# Patient Record
Sex: Female | Born: 1940 | Race: White | Hispanic: No | Marital: Single | Smoking: Current every day smoker
Health system: Southern US, Community
[De-identification: ages and names within clinical notes are randomized; demographics above are authoritative.]

## PROBLEM LIST (undated history)

## (undated) DIAGNOSIS — S93401A Sprain of unspecified ligament of right ankle, initial encounter: Secondary | ICD-10-CM

## (undated) DIAGNOSIS — Z5189 Encounter for other specified aftercare: Secondary | ICD-10-CM

## (undated) DIAGNOSIS — F32A Depression, unspecified: Secondary | ICD-10-CM

## (undated) DIAGNOSIS — F419 Anxiety disorder, unspecified: Secondary | ICD-10-CM

## (undated) DIAGNOSIS — I639 Cerebral infarction, unspecified: Secondary | ICD-10-CM

## (undated) DIAGNOSIS — C801 Malignant (primary) neoplasm, unspecified: Secondary | ICD-10-CM

## (undated) DIAGNOSIS — Z87898 Personal history of other specified conditions: Secondary | ICD-10-CM

## (undated) DIAGNOSIS — E039 Hypothyroidism, unspecified: Secondary | ICD-10-CM

## (undated) DIAGNOSIS — I10 Essential (primary) hypertension: Secondary | ICD-10-CM

## (undated) DIAGNOSIS — G459 Transient cerebral ischemic attack, unspecified: Secondary | ICD-10-CM

## (undated) DIAGNOSIS — Z9010 Acquired absence of unspecified breast and nipple: Secondary | ICD-10-CM

## (undated) DIAGNOSIS — F4310 Post-traumatic stress disorder, unspecified: Secondary | ICD-10-CM

## (undated) DIAGNOSIS — F329 Major depressive disorder, single episode, unspecified: Secondary | ICD-10-CM

## (undated) HISTORY — DX: Encounter for other specified aftercare: Z51.89

## (undated) HISTORY — DX: Anxiety disorder, unspecified: F41.9

## (undated) HISTORY — DX: Acquired absence of unspecified breast and nipple: Z90.10

## (undated) HISTORY — DX: Transient cerebral ischemic attack, unspecified: G45.9

## (undated) HISTORY — PX: CHOLECYSTECTOMY: SHX55

## (undated) HISTORY — DX: Essential (primary) hypertension: I10

## (undated) HISTORY — PX: TUBAL LIGATION: SHX77

## (undated) HISTORY — PX: LUNG BIOPSY: SHX232

## (undated) HISTORY — DX: Major depressive disorder, single episode, unspecified: F32.9

## (undated) HISTORY — PX: OTHER SURGICAL HISTORY: SHX169

## (undated) HISTORY — DX: Sprain of unspecified ligament of right ankle, initial encounter: S93.401A

## (undated) HISTORY — DX: Depression, unspecified: F32.A

## (undated) HISTORY — DX: Hypothyroidism, unspecified: E03.9

---

## 2000-11-26 DIAGNOSIS — I639 Cerebral infarction, unspecified: Secondary | ICD-10-CM

## 2000-11-26 HISTORY — PX: OTHER SURGICAL HISTORY: SHX169

## 2000-11-26 HISTORY — DX: Cerebral infarction, unspecified: I63.9

## 2001-10-26 HISTORY — PX: CAROTID ENDARTERECTOMY: SUR193

## 2001-10-31 ENCOUNTER — Encounter (INDEPENDENT_AMBULATORY_CARE_PROVIDER_SITE_OTHER): Payer: Self-pay | Admitting: Specialist

## 2001-10-31 ENCOUNTER — Ambulatory Visit (HOSPITAL_COMMUNITY): Admission: RE | Admit: 2001-10-31 | Discharge: 2001-11-01 | Payer: Self-pay

## 2003-01-20 ENCOUNTER — Encounter: Admission: RE | Admit: 2003-01-20 | Discharge: 2003-01-20 | Payer: Self-pay | Admitting: Internal Medicine

## 2003-02-09 ENCOUNTER — Encounter: Admission: RE | Admit: 2003-02-09 | Discharge: 2003-02-09 | Payer: Self-pay | Admitting: Internal Medicine

## 2003-03-12 ENCOUNTER — Encounter: Admission: RE | Admit: 2003-03-12 | Discharge: 2003-03-12 | Payer: Self-pay | Admitting: Internal Medicine

## 2003-03-15 ENCOUNTER — Encounter: Admission: RE | Admit: 2003-03-15 | Discharge: 2003-03-15 | Payer: Self-pay | Admitting: Internal Medicine

## 2003-03-29 ENCOUNTER — Encounter: Admission: RE | Admit: 2003-03-29 | Discharge: 2003-03-29 | Payer: Self-pay | Admitting: Internal Medicine

## 2003-06-15 ENCOUNTER — Encounter: Admission: RE | Admit: 2003-06-15 | Discharge: 2003-06-15 | Payer: Self-pay | Admitting: Internal Medicine

## 2003-09-06 ENCOUNTER — Encounter: Payer: Self-pay | Admitting: Internal Medicine

## 2003-09-06 ENCOUNTER — Ambulatory Visit (HOSPITAL_COMMUNITY): Admission: RE | Admit: 2003-09-06 | Discharge: 2003-09-06 | Payer: Self-pay | Admitting: Internal Medicine

## 2003-09-22 ENCOUNTER — Ambulatory Visit (HOSPITAL_COMMUNITY): Admission: RE | Admit: 2003-09-22 | Discharge: 2003-09-22 | Payer: Self-pay | Admitting: Internal Medicine

## 2003-10-19 ENCOUNTER — Encounter: Admission: RE | Admit: 2003-10-19 | Discharge: 2003-10-19 | Payer: Self-pay | Admitting: Internal Medicine

## 2003-10-27 ENCOUNTER — Encounter: Admission: RE | Admit: 2003-10-27 | Discharge: 2003-10-27 | Payer: Self-pay | Admitting: Internal Medicine

## 2004-02-02 ENCOUNTER — Encounter: Admission: RE | Admit: 2004-02-02 | Discharge: 2004-02-02 | Payer: Self-pay | Admitting: Internal Medicine

## 2004-03-24 ENCOUNTER — Encounter: Admission: RE | Admit: 2004-03-24 | Discharge: 2004-03-24 | Payer: Self-pay | Admitting: Internal Medicine

## 2004-05-09 ENCOUNTER — Encounter: Admission: RE | Admit: 2004-05-09 | Discharge: 2004-05-09 | Payer: Self-pay | Admitting: Internal Medicine

## 2004-07-06 ENCOUNTER — Emergency Department (HOSPITAL_COMMUNITY): Admission: EM | Admit: 2004-07-06 | Discharge: 2004-07-06 | Payer: Self-pay | Admitting: Family Medicine

## 2004-08-29 ENCOUNTER — Ambulatory Visit: Payer: Self-pay | Admitting: Internal Medicine

## 2004-08-29 ENCOUNTER — Encounter (INDEPENDENT_AMBULATORY_CARE_PROVIDER_SITE_OTHER): Payer: Self-pay | Admitting: Internal Medicine

## 2004-11-28 ENCOUNTER — Ambulatory Visit: Payer: Self-pay | Admitting: Internal Medicine

## 2004-11-28 LAB — FECAL OCCULT BLOOD, GUAIAC: Fecal Occult Blood: NEGATIVE

## 2005-07-19 ENCOUNTER — Ambulatory Visit: Payer: Self-pay | Admitting: Internal Medicine

## 2005-10-10 ENCOUNTER — Ambulatory Visit: Payer: Self-pay | Admitting: Internal Medicine

## 2005-10-24 ENCOUNTER — Ambulatory Visit: Payer: Self-pay | Admitting: Internal Medicine

## 2005-12-21 ENCOUNTER — Ambulatory Visit: Payer: Self-pay | Admitting: Internal Medicine

## 2005-12-28 ENCOUNTER — Emergency Department (HOSPITAL_COMMUNITY): Admission: EM | Admit: 2005-12-28 | Discharge: 2005-12-28 | Payer: Self-pay | Admitting: Emergency Medicine

## 2005-12-31 ENCOUNTER — Ambulatory Visit: Payer: Self-pay | Admitting: Orthopedic Surgery

## 2006-02-11 ENCOUNTER — Ambulatory Visit: Payer: Self-pay | Admitting: Orthopedic Surgery

## 2006-03-25 ENCOUNTER — Ambulatory Visit: Payer: Self-pay | Admitting: Orthopedic Surgery

## 2006-06-26 ENCOUNTER — Ambulatory Visit: Payer: Self-pay | Admitting: Internal Medicine

## 2006-07-05 ENCOUNTER — Ambulatory Visit: Payer: Self-pay | Admitting: Internal Medicine

## 2006-08-07 ENCOUNTER — Ambulatory Visit: Payer: Self-pay | Admitting: Internal Medicine

## 2006-11-18 ENCOUNTER — Ambulatory Visit: Payer: Self-pay | Admitting: Internal Medicine

## 2006-11-18 ENCOUNTER — Encounter (INDEPENDENT_AMBULATORY_CARE_PROVIDER_SITE_OTHER): Payer: Self-pay | Admitting: Internal Medicine

## 2006-11-18 LAB — CONVERTED CEMR LAB
Free T4: 2.01 ng/dL — ABNORMAL HIGH (ref 0.89–1.80)
HCT: 45.9 % (ref 36.0–46.0)
Lymphocytes Relative: 40 % (ref 12–46)
Lymphs Abs: 3.8 10*3/uL — ABNORMAL HIGH (ref 0.7–3.3)
MCHC: 34 g/dL (ref 30.0–36.0)
Monocytes Absolute: 0.6 10*3/uL (ref 0.2–0.7)
Neutro Abs: 4.9 10*3/uL (ref 1.7–7.7)
RBC: 4.96 M/uL (ref 3.87–5.11)

## 2006-12-12 ENCOUNTER — Telehealth: Payer: Self-pay | Admitting: *Deleted

## 2006-12-13 ENCOUNTER — Encounter (INDEPENDENT_AMBULATORY_CARE_PROVIDER_SITE_OTHER): Payer: Self-pay | Admitting: Internal Medicine

## 2006-12-13 DIAGNOSIS — Z8679 Personal history of other diseases of the circulatory system: Secondary | ICD-10-CM | POA: Insufficient documentation

## 2006-12-13 DIAGNOSIS — H409 Unspecified glaucoma: Secondary | ICD-10-CM | POA: Insufficient documentation

## 2006-12-13 DIAGNOSIS — I10 Essential (primary) hypertension: Secondary | ICD-10-CM | POA: Insufficient documentation

## 2006-12-13 DIAGNOSIS — E039 Hypothyroidism, unspecified: Secondary | ICD-10-CM

## 2006-12-13 DIAGNOSIS — F329 Major depressive disorder, single episode, unspecified: Secondary | ICD-10-CM

## 2006-12-13 DIAGNOSIS — F411 Generalized anxiety disorder: Secondary | ICD-10-CM

## 2006-12-16 ENCOUNTER — Ambulatory Visit: Payer: Self-pay | Admitting: Internal Medicine

## 2006-12-24 ENCOUNTER — Encounter (INDEPENDENT_AMBULATORY_CARE_PROVIDER_SITE_OTHER): Payer: Self-pay | Admitting: Internal Medicine

## 2006-12-24 LAB — CONVERTED CEMR LAB
Cholesterol: 115 mg/dL (ref 0–200)
LDL Cholesterol: 61 mg/dL (ref 0–99)

## 2007-01-15 ENCOUNTER — Telehealth: Payer: Self-pay | Admitting: *Deleted

## 2007-03-03 ENCOUNTER — Encounter (INDEPENDENT_AMBULATORY_CARE_PROVIDER_SITE_OTHER): Payer: Self-pay | Admitting: Internal Medicine

## 2007-03-03 ENCOUNTER — Ambulatory Visit: Payer: Self-pay | Admitting: *Deleted

## 2007-03-03 DIAGNOSIS — J329 Chronic sinusitis, unspecified: Secondary | ICD-10-CM | POA: Insufficient documentation

## 2007-03-03 DIAGNOSIS — Z87448 Personal history of other diseases of urinary system: Secondary | ICD-10-CM

## 2007-03-03 DIAGNOSIS — F17200 Nicotine dependence, unspecified, uncomplicated: Secondary | ICD-10-CM

## 2007-03-06 ENCOUNTER — Encounter (INDEPENDENT_AMBULATORY_CARE_PROVIDER_SITE_OTHER): Payer: Self-pay | Admitting: Internal Medicine

## 2007-03-06 LAB — CONVERTED CEMR LAB
ALT: 28 units/L (ref 0–35)
AST: 29 units/L (ref 0–37)
Albumin: 4.7 g/dL (ref 3.5–5.2)
Alkaline Phosphatase: 96 units/L (ref 39–117)
BUN: 17 mg/dL (ref 6–23)
Basophils Absolute: 0.1 10*3/uL (ref 0.0–0.1)
Basophils Relative: 1 % (ref 0–1)
Calcium: 9.7 mg/dL (ref 8.4–10.5)
Eosinophils Relative: 2 % (ref 0–5)
HCT: 48.2 % — ABNORMAL HIGH (ref 36.0–46.0)
Hemoglobin: 16 g/dL — ABNORMAL HIGH (ref 12.0–15.0)
Lymphocytes Relative: 35 % (ref 12–46)
Lymphs Abs: 3 10*3/uL (ref 0.7–3.3)
MCHC: 33.2 g/dL (ref 30.0–36.0)
MCV: 94 fL (ref 78.0–100.0)
Monocytes Absolute: 0.5 10*3/uL (ref 0.2–0.7)
Monocytes Relative: 6 % (ref 3–11)
Platelets: 307 10*3/uL (ref 150–400)
RBC: 5.13 M/uL — ABNORMAL HIGH (ref 3.87–5.11)
Sodium: 139 meq/L (ref 135–145)
Total Bilirubin: 0.5 mg/dL (ref 0.3–1.2)
WBC: 8.6 10*3/uL (ref 4.0–10.5)

## 2007-03-27 ENCOUNTER — Telehealth (INDEPENDENT_AMBULATORY_CARE_PROVIDER_SITE_OTHER): Payer: Self-pay | Admitting: *Deleted

## 2007-04-29 ENCOUNTER — Telehealth (INDEPENDENT_AMBULATORY_CARE_PROVIDER_SITE_OTHER): Payer: Self-pay | Admitting: *Deleted

## 2007-05-01 ENCOUNTER — Encounter (INDEPENDENT_AMBULATORY_CARE_PROVIDER_SITE_OTHER): Payer: Self-pay | Admitting: Internal Medicine

## 2007-05-01 ENCOUNTER — Ambulatory Visit (HOSPITAL_COMMUNITY): Admission: RE | Admit: 2007-05-01 | Discharge: 2007-05-01 | Payer: Self-pay | Admitting: Internal Medicine

## 2007-05-02 ENCOUNTER — Encounter (INDEPENDENT_AMBULATORY_CARE_PROVIDER_SITE_OTHER): Payer: Self-pay | Admitting: *Deleted

## 2007-05-02 LAB — CONVERTED CEMR LAB: TSH: 0.087 microintl units/mL — ABNORMAL LOW (ref 0.350–5.50)

## 2007-07-03 ENCOUNTER — Telehealth (INDEPENDENT_AMBULATORY_CARE_PROVIDER_SITE_OTHER): Payer: Self-pay | Admitting: Pharmacy Technician

## 2007-07-10 ENCOUNTER — Ambulatory Visit: Payer: Self-pay | Admitting: Internal Medicine

## 2007-07-10 ENCOUNTER — Encounter (INDEPENDENT_AMBULATORY_CARE_PROVIDER_SITE_OTHER): Payer: Self-pay | Admitting: Internal Medicine

## 2007-07-11 LAB — CONVERTED CEMR LAB: TSH: 0.056 microintl units/mL — ABNORMAL LOW (ref 0.350–5.50)

## 2007-08-01 ENCOUNTER — Telehealth: Payer: Self-pay | Admitting: *Deleted

## 2007-08-01 ENCOUNTER — Encounter (INDEPENDENT_AMBULATORY_CARE_PROVIDER_SITE_OTHER): Payer: Self-pay | Admitting: Internal Medicine

## 2007-10-02 ENCOUNTER — Ambulatory Visit: Payer: Self-pay | Admitting: Internal Medicine

## 2007-10-02 ENCOUNTER — Encounter (INDEPENDENT_AMBULATORY_CARE_PROVIDER_SITE_OTHER): Payer: Self-pay | Admitting: Internal Medicine

## 2007-10-02 DIAGNOSIS — N951 Menopausal and female climacteric states: Secondary | ICD-10-CM

## 2007-10-03 ENCOUNTER — Telehealth: Payer: Self-pay | Admitting: *Deleted

## 2007-10-03 LAB — CONVERTED CEMR LAB: TSH: 0.519 microintl units/mL (ref 0.350–5.50)

## 2008-01-30 ENCOUNTER — Encounter: Payer: Self-pay | Admitting: *Deleted

## 2008-03-04 ENCOUNTER — Encounter (INDEPENDENT_AMBULATORY_CARE_PROVIDER_SITE_OTHER): Payer: Self-pay | Admitting: *Deleted

## 2008-03-04 ENCOUNTER — Ambulatory Visit: Payer: Self-pay | Admitting: Infectious Disease

## 2008-03-08 LAB — CONVERTED CEMR LAB
ALT: 43 units/L — ABNORMAL HIGH (ref 0–35)
AST: 39 units/L — ABNORMAL HIGH (ref 0–37)
Albumin: 4.4 g/dL (ref 3.5–5.2)
Cholesterol: 124 mg/dL (ref 0–200)
HCT: 44.5 % (ref 36.0–46.0)
HDL: 45 mg/dL (ref >39)
LDL Cholesterol: 68 mg/dL (ref 0–99)
Sodium: 141 meq/L (ref 135–145)
TSH: 0.806 microintl units/mL (ref 0.350–5.50)
Total Bilirubin: 0.5 mg/dL (ref 0.3–1.2)
Triglycerides: 56 mg/dL (ref <150)
VLDL: 11 mg/dL (ref 0–40)
WBC: 8.4 10*3/uL (ref 4.0–10.5)

## 2008-07-20 ENCOUNTER — Telehealth (INDEPENDENT_AMBULATORY_CARE_PROVIDER_SITE_OTHER): Payer: Self-pay | Admitting: Internal Medicine

## 2008-07-22 ENCOUNTER — Encounter (INDEPENDENT_AMBULATORY_CARE_PROVIDER_SITE_OTHER): Payer: Self-pay | Admitting: Internal Medicine

## 2008-07-22 ENCOUNTER — Ambulatory Visit: Payer: Self-pay | Admitting: Internal Medicine

## 2008-07-22 DIAGNOSIS — K047 Periapical abscess without sinus: Secondary | ICD-10-CM

## 2008-07-22 DIAGNOSIS — R740 Nonspecific elevation of levels of transaminase and lactic acid dehydrogenase [LDH]: Secondary | ICD-10-CM

## 2008-07-23 DIAGNOSIS — E559 Vitamin D deficiency, unspecified: Secondary | ICD-10-CM | POA: Insufficient documentation

## 2008-07-23 LAB — CONVERTED CEMR LAB
Albumin: 4.4 g/dL (ref 3.5–5.2)
BUN: 13 mg/dL (ref 6–23)
Chloride: 100 meq/L (ref 96–112)
Total Bilirubin: 0.8 mg/dL (ref 0.3–1.2)
Total Protein: 7.4 g/dL (ref 6.0–8.3)
Vit D, 1,25-Dihydroxy: 20 — ABNORMAL LOW (ref 30–89)

## 2008-07-26 ENCOUNTER — Ambulatory Visit (HOSPITAL_COMMUNITY): Admission: RE | Admit: 2008-07-26 | Discharge: 2008-07-26 | Payer: Self-pay | Admitting: Internal Medicine

## 2008-07-26 ENCOUNTER — Encounter (INDEPENDENT_AMBULATORY_CARE_PROVIDER_SITE_OTHER): Payer: Self-pay | Admitting: Internal Medicine

## 2008-07-27 ENCOUNTER — Ambulatory Visit (HOSPITAL_COMMUNITY): Admission: RE | Admit: 2008-07-27 | Discharge: 2008-07-27 | Payer: Self-pay | Admitting: Internal Medicine

## 2008-07-29 ENCOUNTER — Telehealth (INDEPENDENT_AMBULATORY_CARE_PROVIDER_SITE_OTHER): Payer: Self-pay | Admitting: Internal Medicine

## 2008-07-30 ENCOUNTER — Telehealth: Payer: Self-pay | Admitting: Internal Medicine

## 2008-08-05 ENCOUNTER — Telehealth: Payer: Self-pay | Admitting: *Deleted

## 2008-08-11 ENCOUNTER — Ambulatory Visit: Payer: Self-pay | Admitting: Internal Medicine

## 2008-08-11 LAB — CONVERTED CEMR LAB
OCCULT 1: NEGATIVE
OCCULT 2: NEGATIVE
OCCULT 3: NEGATIVE

## 2008-08-24 ENCOUNTER — Ambulatory Visit: Payer: Self-pay | Admitting: Internal Medicine

## 2008-08-24 ENCOUNTER — Encounter (INDEPENDENT_AMBULATORY_CARE_PROVIDER_SITE_OTHER): Payer: Self-pay | Admitting: Internal Medicine

## 2008-08-24 DIAGNOSIS — K8020 Calculus of gallbladder without cholecystitis without obstruction: Secondary | ICD-10-CM | POA: Insufficient documentation

## 2008-08-26 LAB — CONVERTED CEMR LAB
CO2: 20 meq/L (ref 19–32)
Calcium: 9.4 mg/dL (ref 8.4–10.5)
Chloride: 100 meq/L (ref 96–112)
Creatinine, Ser: 0.85 mg/dL (ref 0.40–1.20)
Glucose, Bld: 89 mg/dL (ref 70–99)
Sodium: 140 meq/L (ref 135–145)

## 2008-11-10 ENCOUNTER — Telehealth (INDEPENDENT_AMBULATORY_CARE_PROVIDER_SITE_OTHER): Payer: Self-pay | Admitting: Internal Medicine

## 2008-11-15 ENCOUNTER — Telehealth (INDEPENDENT_AMBULATORY_CARE_PROVIDER_SITE_OTHER): Payer: Self-pay | Admitting: Internal Medicine

## 2009-01-31 ENCOUNTER — Ambulatory Visit: Payer: Self-pay | Admitting: *Deleted

## 2009-01-31 ENCOUNTER — Encounter (INDEPENDENT_AMBULATORY_CARE_PROVIDER_SITE_OTHER): Payer: Self-pay | Admitting: Internal Medicine

## 2009-02-02 LAB — CONVERTED CEMR LAB
AST: 78 units/L — ABNORMAL HIGH (ref 0–37)
Alkaline Phosphatase: 72 units/L (ref 39–117)
BUN: 12 mg/dL (ref 6–23)
CO2: 26 meq/L (ref 19–32)
Chloride: 105 meq/L (ref 96–112)
Glucose, Bld: 96 mg/dL (ref 70–99)
Total Bilirubin: 0.5 mg/dL (ref 0.3–1.2)
Vit D, 25-Hydroxy: 73 ng/mL (ref 30–89)

## 2009-02-17 ENCOUNTER — Telehealth (INDEPENDENT_AMBULATORY_CARE_PROVIDER_SITE_OTHER): Payer: Self-pay | Admitting: Internal Medicine

## 2009-02-23 ENCOUNTER — Encounter (INDEPENDENT_AMBULATORY_CARE_PROVIDER_SITE_OTHER): Payer: Self-pay | Admitting: Internal Medicine

## 2009-02-25 LAB — CONVERTED CEMR LAB: Hep B Core Total Ab: NEGATIVE

## 2009-02-28 ENCOUNTER — Telehealth: Payer: Self-pay | Admitting: *Deleted

## 2009-03-02 ENCOUNTER — Ambulatory Visit (HOSPITAL_COMMUNITY): Admission: RE | Admit: 2009-03-02 | Discharge: 2009-03-02 | Payer: Self-pay | Admitting: Internal Medicine

## 2009-03-11 ENCOUNTER — Encounter (INDEPENDENT_AMBULATORY_CARE_PROVIDER_SITE_OTHER): Payer: Self-pay | Admitting: Internal Medicine

## 2009-03-11 ENCOUNTER — Ambulatory Visit: Payer: Self-pay | Admitting: Internal Medicine

## 2009-03-11 DIAGNOSIS — R5381 Other malaise: Secondary | ICD-10-CM | POA: Insufficient documentation

## 2009-03-11 DIAGNOSIS — R5383 Other fatigue: Secondary | ICD-10-CM

## 2009-03-11 LAB — CONVERTED CEMR LAB
Albumin: 4.5 g/dL (ref 3.5–5.2)
Alkaline Phosphatase: 84 units/L (ref 39–117)
Bilirubin Urine: NEGATIVE
Bilirubin Urine: NEGATIVE
CO2: 20 meq/L (ref 19–32)
Calcium: 10 mg/dL (ref 8.4–10.5)
Ferritin: 204 ng/mL (ref 10–291)
Glucose, Urine, Semiquant: NEGATIVE
Hemoglobin, Urine: NEGATIVE
Ketones, ur: NEGATIVE mg/dL
Ketones, urine, test strip: NEGATIVE
Leukocytes, UA: NEGATIVE
Platelets: 235 10*3/uL (ref 150–400)
Potassium: 4.1 meq/L (ref 3.5–5.3)
Protein, ur: NEGATIVE mg/dL
Sed Rate: 9 mm/hr (ref 0–22)
Specific Gravity, Urine: <1.005
TSH: 2.261 microintl units/mL (ref 0.350–4.500)
Total Bilirubin: 0.4 mg/dL (ref 0.3–1.2)
UIBC: 320 ug/dL
Urine Glucose: NEGATIVE mg/dL
Urobilinogen, UA: 1 (ref 0.0–1.0)
pH: 5

## 2009-03-25 ENCOUNTER — Ambulatory Visit: Payer: Self-pay | Admitting: Internal Medicine

## 2009-03-25 ENCOUNTER — Encounter (INDEPENDENT_AMBULATORY_CARE_PROVIDER_SITE_OTHER): Payer: Self-pay | Admitting: Internal Medicine

## 2009-03-25 DIAGNOSIS — J029 Acute pharyngitis, unspecified: Secondary | ICD-10-CM | POA: Insufficient documentation

## 2009-03-25 LAB — CONVERTED CEMR LAB: Streptococcus, Group A Screen (Direct): NEGATIVE

## 2009-06-16 ENCOUNTER — Ambulatory Visit: Payer: Self-pay | Admitting: Internal Medicine

## 2009-06-16 ENCOUNTER — Encounter (INDEPENDENT_AMBULATORY_CARE_PROVIDER_SITE_OTHER): Payer: Self-pay | Admitting: Internal Medicine

## 2009-06-16 DIAGNOSIS — L989 Disorder of the skin and subcutaneous tissue, unspecified: Secondary | ICD-10-CM | POA: Insufficient documentation

## 2009-06-16 LAB — CONVERTED CEMR LAB
Albumin: 4.2 g/dL (ref 3.5–5.2)
Alkaline Phosphatase: 84 units/L (ref 39–117)
Basophils Relative: 1 % (ref 0–1)
CO2: 19 meq/L (ref 19–32)
Calcium: 9.5 mg/dL (ref 8.4–10.5)
Chloride: 103 meq/L (ref 96–112)
Hemoglobin: 15.5 g/dL — ABNORMAL HIGH (ref 12.0–15.0)
Lymphs Abs: 3.2 10*3/uL (ref 0.7–4.0)
MCV: 95.2 fL (ref 78.0–100.0)
Monocytes Relative: 7 % (ref 3–12)
Neutrophils Relative %: 48 % (ref 43–77)
Potassium: 4 meq/L (ref 3.5–5.3)
RDW: 13.8 % (ref 11.5–15.5)
TSH: 1.251 microintl units/mL (ref 0.350–4.500)
Vit D, 25-Hydroxy: 52 ng/mL (ref 30–89)
WBC: 7.5 10*3/uL (ref 4.0–10.5)

## 2009-07-05 ENCOUNTER — Ambulatory Visit: Payer: Self-pay | Admitting: Internal Medicine

## 2009-08-09 ENCOUNTER — Ambulatory Visit: Payer: Self-pay | Admitting: Internal Medicine

## 2009-08-10 ENCOUNTER — Telehealth (INDEPENDENT_AMBULATORY_CARE_PROVIDER_SITE_OTHER): Payer: Self-pay | Admitting: Internal Medicine

## 2009-08-12 ENCOUNTER — Ambulatory Visit (HOSPITAL_COMMUNITY): Admission: RE | Admit: 2009-08-12 | Discharge: 2009-08-12 | Payer: Self-pay | Admitting: Internal Medicine

## 2009-09-04 ENCOUNTER — Emergency Department (HOSPITAL_COMMUNITY): Admission: EM | Admit: 2009-09-04 | Discharge: 2009-09-05 | Payer: Self-pay | Admitting: Emergency Medicine

## 2009-09-12 ENCOUNTER — Encounter (INDEPENDENT_AMBULATORY_CARE_PROVIDER_SITE_OTHER): Payer: Self-pay | Admitting: Internal Medicine

## 2009-09-12 ENCOUNTER — Ambulatory Visit: Payer: Self-pay | Admitting: Internal Medicine

## 2009-09-12 DIAGNOSIS — S93409A Sprain of unspecified ligament of unspecified ankle, initial encounter: Secondary | ICD-10-CM | POA: Insufficient documentation

## 2009-09-19 ENCOUNTER — Ambulatory Visit (HOSPITAL_COMMUNITY): Admission: RE | Admit: 2009-09-19 | Discharge: 2009-09-19 | Payer: Self-pay | Admitting: Internal Medicine

## 2009-11-17 ENCOUNTER — Telehealth (INDEPENDENT_AMBULATORY_CARE_PROVIDER_SITE_OTHER): Payer: Self-pay | Admitting: Internal Medicine

## 2009-11-17 ENCOUNTER — Encounter (INDEPENDENT_AMBULATORY_CARE_PROVIDER_SITE_OTHER): Payer: Self-pay | Admitting: Internal Medicine

## 2009-11-26 HISTORY — PX: EYE SURGERY: SHX253

## 2009-12-19 ENCOUNTER — Ambulatory Visit: Payer: Self-pay | Admitting: Internal Medicine

## 2009-12-22 LAB — CONVERTED CEMR LAB
AST: 38 units/L — ABNORMAL HIGH (ref 0–37)
Albumin: 4.4 g/dL (ref 3.5–5.2)
BUN: 15 mg/dL (ref 6–23)
CO2: 23 meq/L (ref 19–32)
Calcium: 9.5 mg/dL (ref 8.4–10.5)
Cholesterol: 131 mg/dL (ref 0–200)
Creatinine, Ser: 0.85 mg/dL (ref 0.40–1.20)
Eosinophils Absolute: 0.1 10*3/uL (ref 0.0–0.7)
Eosinophils Relative: 1 % (ref 0–5)
HDL: 52 mg/dL (ref >39)
LDL Cholesterol: 70 mg/dL (ref 0–99)
Monocytes Relative: 5 % (ref 3–12)
RBC: 4.9 M/uL (ref 3.87–5.11)
Sodium: 139 meq/L (ref 135–145)
TSH: 6.8 microintl units/mL — ABNORMAL HIGH (ref 0.350–4.5)
Total Protein: 7 g/dL (ref 6.0–8.3)
VLDL: 9 mg/dL (ref 0–40)
WBC: 7.3 10*3/uL (ref 4.0–10.5)

## 2010-01-06 ENCOUNTER — Ambulatory Visit: Payer: Self-pay | Admitting: Internal Medicine

## 2010-01-26 ENCOUNTER — Emergency Department (HOSPITAL_COMMUNITY): Admission: EM | Admit: 2010-01-26 | Discharge: 2010-01-26 | Payer: Self-pay | Admitting: Emergency Medicine

## 2010-02-13 ENCOUNTER — Ambulatory Visit: Payer: Self-pay | Admitting: Internal Medicine

## 2010-02-13 LAB — CONVERTED CEMR LAB
ALT: 36 units/L — ABNORMAL HIGH (ref 0–35)
Albumin: 4.6 g/dL (ref 3.5–5.2)
CO2: 25 meq/L (ref 19–32)
Calcium: 10.1 mg/dL (ref 8.4–10.5)
Chloride: 101 meq/L (ref 96–112)
Creatinine, Ser: 0.91 mg/dL (ref 0.40–1.20)
Free T4: 1.69 ng/dL (ref 0.80–1.80)
Glucose, Bld: 85 mg/dL (ref 70–99)
HCT: 44.8 % (ref 36.0–46.0)
RBC: 4.79 M/uL (ref 3.87–5.11)
Total Protein: 7.4 g/dL (ref 6.0–8.3)

## 2010-03-08 ENCOUNTER — Telehealth (INDEPENDENT_AMBULATORY_CARE_PROVIDER_SITE_OTHER): Payer: Self-pay | Admitting: Internal Medicine

## 2010-03-20 ENCOUNTER — Telehealth (INDEPENDENT_AMBULATORY_CARE_PROVIDER_SITE_OTHER): Payer: Self-pay | Admitting: Internal Medicine

## 2010-03-26 HISTORY — PX: LAPAROSCOPIC CHOLECYSTECTOMY W/ CHOLANGIOGRAPHY: SUR757

## 2010-04-12 ENCOUNTER — Ambulatory Visit (HOSPITAL_COMMUNITY): Admission: RE | Admit: 2010-04-12 | Discharge: 2010-04-12 | Payer: Self-pay | Admitting: General Surgery

## 2010-05-01 ENCOUNTER — Encounter: Payer: Self-pay | Admitting: Internal Medicine

## 2010-06-13 ENCOUNTER — Ambulatory Visit (HOSPITAL_COMMUNITY): Admission: RE | Admit: 2010-06-13 | Discharge: 2010-06-13 | Payer: Self-pay | Admitting: Internal Medicine

## 2010-06-13 ENCOUNTER — Ambulatory Visit: Payer: Self-pay | Admitting: Internal Medicine

## 2010-06-14 ENCOUNTER — Encounter: Payer: Self-pay | Admitting: Internal Medicine

## 2010-06-14 LAB — CONVERTED CEMR LAB
ALT: 27 units/L (ref 0–35)
Albumin: 4.5 g/dL (ref 3.5–5.2)
Alkaline Phosphatase: 73 units/L (ref 39–117)
Bilirubin, Direct: 0.2 mg/dL (ref 0.0–0.3)
Total Bilirubin: 0.6 mg/dL (ref 0.3–1.2)
Total Protein: 7 g/dL (ref 6.0–8.3)

## 2010-06-27 ENCOUNTER — Telehealth: Payer: Self-pay | Admitting: Internal Medicine

## 2010-06-28 ENCOUNTER — Ambulatory Visit: Payer: Self-pay | Admitting: Internal Medicine

## 2010-06-28 LAB — CONVERTED CEMR LAB
Bilirubin Urine: NEGATIVE
Blood in Urine, dipstick: NEGATIVE
Urobilinogen, UA: 0.2
WBC Urine, dipstick: NEGATIVE

## 2010-07-04 ENCOUNTER — Telehealth: Payer: Self-pay | Admitting: Licensed Clinical Social Worker

## 2010-10-03 ENCOUNTER — Ambulatory Visit: Payer: Self-pay | Admitting: Internal Medicine

## 2010-10-03 ENCOUNTER — Encounter: Payer: Self-pay | Admitting: Internal Medicine

## 2010-10-03 ENCOUNTER — Ambulatory Visit (HOSPITAL_COMMUNITY): Admission: RE | Admit: 2010-10-03 | Discharge: 2010-10-03 | Payer: Self-pay | Admitting: Internal Medicine

## 2010-10-04 ENCOUNTER — Telehealth: Payer: Self-pay | Admitting: Internal Medicine

## 2010-10-09 ENCOUNTER — Telehealth: Payer: Self-pay | Admitting: Internal Medicine

## 2010-10-26 ENCOUNTER — Ambulatory Visit: Payer: Self-pay | Admitting: Internal Medicine

## 2010-12-17 ENCOUNTER — Encounter: Payer: Self-pay | Admitting: Internal Medicine

## 2010-12-18 ENCOUNTER — Encounter: Payer: Self-pay | Admitting: Internal Medicine

## 2010-12-24 LAB — CONVERTED CEMR LAB
Calcium: 9.3 mg/dL (ref 8.4–10.5)
Chloride: 105 meq/L (ref 96–112)
Potassium: 4.1 meq/L (ref 3.5–5.3)
Sodium: 142 meq/L (ref 135–145)

## 2010-12-26 NOTE — Progress Notes (Signed)
Summary: Refill/gh  Phone Note Refill Request Message from:  Fax from Pharmacy on March 08, 2010 8:59 AM  Refills Requested: Medication #1:  SYNTHROID 150 MCG TABS Take one tablet daily for thyroid.   Last Refilled: 01/30/2010  Medication #2:  HYDROCHLOROTHIAZIDE 12.5 MG TABS Take one tablet daily for blood pressure.   Last Refilled: 01/30/2010  Method Requested: Electronic Initial call taken by: Angelina Ok RN,  March 08, 2010 8:59 AM    Prescriptions: HYDROCHLOROTHIAZIDE 12.5 MG TABS (HYDROCHLOROTHIAZIDE) Take one tablet daily for blood pressure.  #32 x 6   Entered and Authorized by:   Elby Showers MD   Signed by:   Elby Showers MD on 03/09/2010   Method used:   Electronically to        [facility]* (retail)       [address]       [place], [place]  [address]       Ph: [phone]       Fax: [phone]   RxID:   [id number] SYNTHROID 150 MCG TABS (LEVOTHYROXINE SODIUM) Take one tablet daily for thyroid.  #32 x 3   Entered and Authorized by:   Elby Showers MD   Signed by:   Elby Showers MD on 03/09/2010   Method used:   Electronically to        [facility]* (retail)       [address]       [place], [place]  [address]       Ph: [phone]       Fax: [phone]   RxID:   [id number]

## 2010-12-26 NOTE — Assessment & Plan Note (Signed)
Summary: F/U/EST/VS   Vital Signs:  Patient profile:   70 year old female Height:      69 inches (175.26 cm) Weight:      166.8 pounds (75.82 kg) BMI:     24.72 Temp:     97.2 degrees F (36.22 degrees C) oral Pulse rate:   67 / minute BP sitting:   134 / 74  (right arm)  Vitals Entered By: Stanton Kidney Ditzler RN (February 13, 2010 11:40 AM) Is Patient Diabetic? No Pain Assessment Patient in pain? yes     Location: tooth ache Intensity: 1 Type: aching Onset of pain  past 2 weeks Nutritional Status BMI of 19 -24 = normal Nutritional Status Detail appetite down  Have you ever been in a relationship where you felt threatened, hurt or afraid?denies   Does patient need assistance? Functional Status Self care Ambulation Normal Comments FU - went to ER due to chest pain - needs GB surgery wants surgery to be done at [facility]. Sore throat cont.   Primary Care Provider:  Elby Showers MD   History of Present Illness: 23yof with pmh outlined in this chart is here to discuss  1) she went to [facility] with chest pain and was told that she needs a cholycystectomy.  Wants to go to someone in [place].    2) still with a sore throat.  Lives with multiple young people who keep contracting viruses and some who have had recurrent strep throat.  She is able to swallow. Has no other URI symptoms.  No fevers, chills, cough or chest pain.  3) had a bad gi bug with n/v/d.  Has lost 6 lbs this month.  Still hesitant to eat anything.  Feeling weak and dizzy when she stands up.  Drinks only 2 cups of coffee in the morning and then maybe a diet coke in the day time.  4) has been taking milk thistle for her liver.  Depression History:      The patient denies a depressed mood most of the day and a diminished interest in her usual daily activities.         Preventive Screening-Counseling & Management  Alcohol-Tobacco     Alcohol drinks/day: 0     Smoking Status: current     Smoking  Cessation Counseling: yes     Packs/Day: 3/4 ppd     Year Started: 1960  Caffeine-Diet-Exercise     Caffeine use/day: 2 cups of coffee and soda     Does Patient Exercise: no     Type of exercise: walking stairs at home     Times/week: 7  Medications Prior to Update: 1)  Alprazolam 0.5 Mg Tabs (Alprazolam) .Marland Kitchen.. 1 1/2 Tablets Three Times Daily 2)  Lisinopril 40 Mg Tabs (Lisinopril) .... Take 1 Tablet By Mouth Once A Day 3)  Synthroid 150 Mcg Tabs (Levothyroxine Sodium) .... Take One Tablet Daily For Thyroid. 4)  Cardizem La 180 Mg Xr24h-Tab (Diltiazem Hcl Coated Beads) .... Take 1 Tablet By Mouth Once A Day 5)  Aspirin 81 Mg Tbec (Aspirin) .... Take 1 Tablet By Mouth Once A Day 6)  Fosamax 35 Mg Tabs (Alendronate Sodium) .... Take One Tablet By Mouth Once A Week, With [employer]. Do Not Lay Down For 30 Minutes After Swallowing The Tablet. 7)  Lac-Hydrin Five 5 % Lotn (Ammonium Lactate) .... Apply Two Times A Day. 8)  Hydrochlorothiazide 12.5 Mg Tabs (Hydrochlorothiazide) .... Take One Tablet Daily For Blood Pressure.  9)  Vitamin D (Ergocalciferol) 50000 Unit Caps (Ergocalciferol) .... Take One Tablet Weekly For 8 Weeks. 10)  Amoxicillin 875 Mg Tabs (Amoxicillin) .... Take One Tablet Two Times A Day For 10 Days.  Allergies (verified): No Known Drug Allergies  Review of Systems       per hpi  Physical Exam  General:  alert and well-developed.   Head:  normocephalic and atraumatic.   Eyes:  vision grossly intact, pupils equal, pupils round, and pupils reactive to light.   Nose:  no external deformity.   Mouth:  good dentition, pharynx pink and moist, no erythema, no exudates, and no posterior lymphoid hypertrophy.   Lungs:  normal respiratory effort and normal breath sounds.   Heart:  normal rate, regular rhythm, and no murmur.   Abdomen:  soft, non-tender, normal bowel sounds, no distention, no masses, and no guarding.   Pulses:  2+ Extremities:  no edema Neurologic:  alert  & oriented X3, cranial nerves II-XII intact, strength normal in all extremities, and gait normal.   Skin:  no suspicious lesions.   Cervical Nodes:  no anterior cervical adenopathy and no posterior cervical adenopathy.   Psych:  Oriented X3, memory intact for recent and remote, normally interactive, and good eye contact.     Impression & Recommendations:  Problem # 1:  TRANSAMINASES, SERUM, ELEVATED (ICD-790.4) She has been thoroughly evaluated for this.  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  With her recent episode of pain and concurrent mild increase in LFT's it is possible that she has intermittant CBD obstruction causing symtpoms.  She would like to have her gb out to avoid recurrence of symptoms.  She would like to go to [facility] for this.  We do not refer to anyone in [place] so I have asked her to look around for someone, then let us know so we can send records.  This is not urgent.  Problem # 2:  CHOLELITHIASIS, ASYMPTOMATIC (ICD-574.20) See #1  Problem # 3:  PHARYNGITIS (ICD-462) I have already given one full course of amoxicillin.  There is no real erythema or exudate today, no fever or LAD.  Will not treat again at this time.  She should try symptomatic relief and avoid contact with her grandchildren when they are sick.  The following medications were removed from the medication list:    Amoxicillin 875 Mg Tabs (Amoxicillin) .Marland Kitchen... Take one tablet two times a day for 10 days. Her updated medication list for this problem includes:    Aspirin 81 Mg Tbec (Aspirin) .Marland Kitchen... Take 1 tablet by mouth once a day  Problem # 4:  HYPOTHYROIDISM (ICD-244.9) Time to check TSH after change in dose of synthroid.  Her updated medication list for this problem includes:    Synthroid 150 Mcg Tabs (Levothyroxine sodium) .Marland Kitchen... Take one tablet daily for thyroid.  Orders: T-TSH [phone]) T-T4, Free ([phone])  Problem # 5:   FATIGUE (ICD-780.79) She feels weak and fatigued.  She has had a gi bug with copious diarrhea and decreased appetite.  Will check basic labs, but I think she is still volume depleted and worn out from being sick.  Advised her to increase fluid intake and try to eat some protien.  She will call if she does not feel better in a wekk.  Orders: T-CBC No Diff ([id number]) T-Comprehensive Metabolic Panel ([id number])  Problem # 6:  VITAMIN D DEFICIENCY (ICD-268.9) Will check  to see that repletion has worked.  Is so will need to go to a ca/d suppliment daily.  Orders: T-Vitamin D (25-Hydroxy) [id number])  Complete Medication List: 1)  Alprazolam 0.5 Mg Tabs (Alprazolam) .Marland Kitchen.. 1 1/2 tablets three times daily 2)  Lisinopril 40 Mg Tabs (Lisinopril) .... Take 1 tablet by mouth once a day 3)  Synthroid 150 Mcg Tabs (Levothyroxine sodium) .... Take one tablet daily for thyroid. 4)  Cardizem La 180 Mg Xr24h-tab (Diltiazem hcl coated beads) .... Take 1 tablet by mouth once a day 5)  Aspirin 81 Mg Tbec (Aspirin) .... Take 1 tablet by mouth once a day 6)  Fosamax 35 Mg Tabs (Alendronate sodium) .... Take one tablet by mouth once a week, with plenty of water. do not lay down for 30 minutes after swallowing the tablet. 7)  Hydrochlorothiazide 12.5 Mg Tabs (Hydrochlorothiazide) .... Take one tablet daily for blood pressure. 8)  Calcium-vitamin D 600-200 Mg-unit Tabs (Calcium-vitamin d) .... Take one tablet two times a day.  Other Orders: T-Hemoccult Card-Multiple (take home) ([id number])  Patient Instructions: 1)  You had labwork done today.  We will call you if there is anything that needs to be addressed before your next appointment. 2)  You should find a surgeon that you would like to do your gall bladder surgery.  Once you have their information, please let us know who it is so that we can sent records. 3)  Please try to increase your fluid intake. 4)  If you are not feeling better in two weeks, please  call for an appointment. 5)  Please schedule a follow-up appointment in 6 months. Process Orders Check Orders Results:     [facility]: Check successful Tests Sent for requisitioning (February 15, 2010 9:38 AM):     02/13/2010: [facility] -- T-CBC No Diff [[id number]] (signed)     02/13/2010: [facility] -- T-TSH [phone] (signed)     02/13/2010: [facility] -- [place], [place] [[id number]] (signed)     02/13/2010: [facility] -- T-Comprehensive Metabolic Panel [phone] (signed)     02/13/2010: [facility] -- T-Vitamin D (25-Hydroxy) [phone] (signed)    Prevention & Chronic Care Immunizations   Influenza vaccine: Not documented    Tetanus booster: 06/26/2006: Td    Pneumococcal vaccine: Pneumovax  (08/09/2009)    H. zoster vaccine: Not documented  Colorectal Screening   Hemoccult: Negative  (11/28/2004)   Hemoccult action/deferral: Ordered  (02/13/2010)    Colonoscopy: Not documented   Colonoscopy action/deferral: Refused  (09/12/2009)  Other Screening   Pap smear: Normal  (08/29/2004)   Pap smear action/deferral: Not indicated-other  (08/09/2009)    Mammogram: ASSESSMENT: Negative - BI-RADS 1^MM DIGITAL SCREENING  (08/12/2009)   Mammogram action/deferral: Ordered  (08/09/2009)    DXA bone density scan:  Lumbar Spine:  T Score-2.5 to -1.0 Spine.   Hip Total: T Score -2.5 to -1.0 Hip.      (07/26/2008)   DXA scan due: 07/2010    Smoking status: current  (02/13/2010)   Smoking cessation counseling: yes  (02/13/2010)  Lipids   Total Cholesterol: 131  (12/19/2009)   Lipid panel action/deferral: Lipid Panel ordered   LDL: 70  (12/19/2009)   LDL Direct: Not documented   HDL: 52  (12/19/2009)   Triglycerides: 47  (12/19/2009)  Hypertension   Last Blood Pressure: 134 / 74  (02/13/2010)   Serum creatinine: 0.85  (12/19/2009)   BMP action: Ordered   Serum  potassium  3.8  (12/19/2009) CMP ordered     Hypertension flowsheet reviewed?: Yes   Progress toward BP goal: Unchanged  Self-Management Support :   Personal Goals (by the next clinic visit) :      Personal blood pressure goal: 130/80  (09/12/2009)   Patient will work on the following items until the next clinic visit to reach self-care goals:     Medications and monitoring: take my medicines every day, check my blood pressure, bring all of my medications to every visit, weigh myself weekly  (02/13/2010)     Eating: drink diet soda or water instead of juice or soda, eat more vegetables, use fresh or frozen vegetables, eat foods that are low in salt, eat baked foods instead of fried foods, eat fruit for snacks and desserts, limit or avoid alcohol  (02/13/2010)     Activity: take a 30 minute walk every day, take the stairs instead of the elevator  (02/13/2010)    Hypertension self-management support: Written self-care plan, Education handout, Resources for patients handout  (02/13/2010)   Hypertension self-care plan printed.   Hypertension education handout printed      Resource handout printed.   Nursing Instructions: Provide Hemoccult cards with instructions (see order)   Appended Document: F/U/EST/VS    Clinical Lists Changes  Orders: Added new Referral order of Surgical Referral (Surgery) - Signed

## 2010-12-26 NOTE — Assessment & Plan Note (Signed)
Summary: EST-F/U VISIT/CH   Vital Signs:  Patient profile:   70 year old female Height:      69 inches Weight:      160.4 pounds BMI:     23.77 Temp:     97.5 degrees F oral Pulse rate:   84 / minute BP sitting:   100 / 60  (right arm)  Vitals Entered By: Filomena Jungling NT II (October 26, 2010 1:10 PM) CC: follow-up visit for blood pressure medi, Depression Is Patient Diabetic? No Pain Assessment Patient in pain? no      Nutritional Status BMI of 19 -24 = normal  Does patient need assistance? Functional Status Self care Ambulation Normal   Primary Care Provider:  Elby Showers MD  CC:  follow-up visit for blood pressure medi and Depression.  History of Present Illness: Follow up o: 1. HTN. patient has had a recent hx of hypotension. Patient's Cardizem dose was reduced down to 60 mg by mouth daiy and HCTZ ->however, the patient still continues to take HCTZ. 2. Helath maintenance: refuses colonoscopy. mammogram --needs to make an appointment. Refuses Bone Dexa. Takes ASA 81 mg Po daily. Refuses Pap. Recieved flu shot. and up-to date-on her TD and Pneumovax.  Depression History:      The patient denies a depressed mood most of the day and a diminished interest in her usual daily activities.         Preventive Screening-Counseling & Management  Alcohol-Tobacco     Alcohol drinks/day: sometimes at night     Alcohol type: red wine     Smoking Status: current     Smoking Cessation Counseling: yes     Packs/Day: 1.5     Year Started: 1960  Caffeine-Diet-Exercise     Caffeine use/day: 2 cups of coffee and soda     Does Patient Exercise: no     Type of exercise: walking stairs at home     Times/week: 7  Current Problems (verified): 1)  Ankle Sprain, Left  (ICD-845.00) 2)  Skin Lesion  (ICD-709.9) 3)  Pharyngitis  (ICD-462) 4)  Fatigue  (ICD-780.79) 5)  Cholelithiasis, Asymptomatic  (ICD-574.20) 6)  Osteopenia - Spine and Hip  (ICD-733.90) 7)  Vitamin D Deficiency   (ICD-268.9) 8)  Transaminases, Serum, Elevated  (ICD-790.4) 9)  Abscess, Tooth  (ICD-522.5) 10)  Preventive Health Care  (ICD-V70.0) 11)  Postmenopausal Status  (ICD-627.2) 12)  Tobacco Abuse  (ICD-305.1) 13)  Sinusitis  (ICD-473.9) 14)  Dysuria, Hx of  (ICD-V13.09) 15)  Aftercare, Long-term Use, Medications Nec  (ICD-V58.69) 16)  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 * Lactose Intolerance  Past History:  Past medical, surgical, family and social histories (including risk factors) reviewed for relevance to current acute and chronic problems.  Past Medical History: Reviewed history from 12/13/2006 and no changes required. 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 left endarterectomy (LICA)  left breast Bx - 2002 benign surgery for glaucoma, cataract  Family History: Reviewed history from 06/16/2009 and no changes required. no CAD RA runs in her family breast cancer in her sister  in her 24's  Social History: Reviewed history from 06/16/2009 and no changes required. Current Smoker 1 1/2ppd lives with her son and his family which is stressful retired Production designer, theatre/television/film, Merchandiser, retail  Physical Exam  General:  alert and well-developed.   Neck:  supple, full ROM, no masses, and no carotid bruits. CEA scar on left is noted.  Lungs:  Normal respiratory effort, chest expands symmetrically. Lungs are clear to auscultation, no crackles or wheezes. Heart:  Normal rate and regular rhythm. S1 and S2 normal without gallop, murmur, click, rub or other extra  sounds. Abdomen:  Bowel sounds positive,abdomen soft and non-tender without masses, organomegaly or hernias noted.  Msk:  No deformity or scoliosis noted of thoracic or lumbar spine.   Pulses:  R and L ,radial,femoral,dorsalis pedis and posterior tibial pulses are full and equal bilaterally Extremities:  No clubbing, cyanosis, edema, or deformity noted with normal full range of motion of all joints.   Neurologic:  alert & oriented X3 and gait normal.   Skin:  turgor normal, no rashes, no suspicious lesions, and no petechiae.   Psych:  Oriented X3, memory intact for recent and remote, normally interactive, good eye contact, not depressed appearing, not agitated, not suicidal, not homicidal, and slightly anxious.     Impression & Recommendations:  Problem # 1:  HYPOTHYROIDISM (ICD-244.9) Assessment Improved C.ontrolled.  Will continue wtih current dose of Synthroid 125 micrograms Po daily Her updated medication list for this problem includes:    Synthroid 125 Mcg Tabs (Levothyroxine sodium) .Marland Kitchen... Take 1 tablet by mouth once a day  Labs Reviewed: TSH: 0.422 (10/03/2010)    Chol: 131 (12/19/2009)   HDL: 52 (12/19/2009)   LDL: 70 (12/19/2009)   TG: 47 (12/19/2009)  Problem # 2:  HYPERTENSION (ICD-401.9) Assessment: Unchanged No othostatics. Strongly advised to STOP taking HCTZ. Will follow next visit. Strongly advised to follow up if she has any concerns. Her updated medication list for this problem includes:    Lisinopril 40 Mg Tabs (Lisinopril) .Marland Kitchen... Take 1 tablet by mouth once a day    Cardizem 60 Mg Tabs (Diltiazem hcl) .Marland Kitchen... Take one tablet by mouth daily  BP today: 100/60 Prior BP: 107/67 (10/03/2010)  Prior 10 Yr Risk Heart Disease: 11 % (09/12/2009)  Labs Reviewed: K+: 4.0 (02/13/2010) Creat: : 0.91 (02/13/2010)   Chol: 131 (12/19/2009)   HDL: 52 (12/19/2009)   LDL: 70 (12/19/2009)   TG: 47 (12/19/2009)  Complete Medication List: 1)  Alprazolam 0.5 Mg Tabs (Alprazolam) .Marland Kitchen..  1 1/2 tablets three times daily 2)  Lisinopril 40 Mg Tabs (Lisinopril) .... Take 1 tablet by mouth once a day 3)  Synthroid 125 Mcg Tabs (Levothyroxine sodium) .... Take 1 tablet by mouth once a day 4)  Cardizem 60 Mg Tabs (Diltiazem hcl) .... Take one tablet by mouth daily 5)  Aspirin 81 Mg Tbec (Aspirin) .... Take 1 tablet by mouth once a day 6)  Fosamax 35 Mg Tabs (Alendronate sodium) .... Take one tablet by mouth once a week, with plenty of water. do not lay down for 30 minutes after swallowing the tablet. 7)  Calcium-vitamin D 600-200 Mg-unit Tabs (Calcium-vitamin d) .... Take one tablet two times a day. 8)  Neomycin-polymyxin B Gu 40-200000 Soln (Neomycin-polymyxin b gu) .... Apply one drop to right eye three times a day for 5 days   Orders Added: 1)  Est. Patient Level III [64403]

## 2010-12-26 NOTE — Progress Notes (Signed)
Summary: Soc. Work/Smoking Cessation  Phone Note Outgoing Call   Call placed by: Soc. Work Call placed to: Patient Summary of Call: Called patient and invited her to make appointment with me to discuss quitting smoking.  The patient said she was not ready to quit and right now is living with one of her adult children and grandchildren.   It is not a smokefree household.  I offered to send smoking cessation information with my card and encouraged her to contact me when she is ready.   Dorothe Pea  July 04, 2010 12:52 PM

## 2010-12-26 NOTE — Progress Notes (Signed)
Summary: phone/gg  Phone Note Call from Patient   Caller: Patient Summary of Call: Pt called to report her Blood Pressure reading is 125/65 in left arm.  heart rate 79 Pt # [id number] Initial call taken by: Merrie Roof RN,  October 09, 2010 5:26 PM  Follow-up for Phone Call        Rx Called In Follow-up by: Deatra Robinson MD,  October 10, 2010 1:41 PM

## 2010-12-26 NOTE — Assessment & Plan Note (Signed)
Summary: RECK B/P/EST/VS   Vital Signs:  Patient profile:   70 year old female Height:      69 inches (175.26 cm) Weight:      172.6 pounds (78.45 kg) BMI:     25.58 Temp:     96.9 degrees F (36.06 degrees C) oral Pulse rate:   67 / minute BP sitting:   137 / 74  (right arm)  Vitals Entered By: Stanton Kidney Ditzler RN (January 06, 2010 2:02 PM) Is Patient Diabetic? No Pain Assessment Patient in pain? yes     Location: tooth Intensity: 3 Type: stinging Onset of pain  past 2 days Nutritional Status BMI of 25 - 29 = overweight Nutritional Status Detail appetite no change  Have you ever been in a relationship where you felt threatened, hurt or afraid?denies   Does patient need assistance? Functional Status Self care Ambulation Normal Comments FU BP - discuss labs and wants copy. Discuss Fosamax. Hx of strept in house - has sore throat - wants strept test.   Primary Care Provider:  Elby Showers MD   History of Present Illness: This is a 70 year old woman with past medical history of   Hypertension Transient ischemic attack, hx of Glaucoma Hypothyroidism ankle sprain - right Anxiety, dependent on Xanax Depression had 2 doses of Hep B vaccine and will need the third one between 02-06/2007  She is here for BP recheck and BP has improved.  She is concerned that she may have strep throat.  Two of her grandchildren that she lives with have had strep this week.  She now as a sore throat so painful it is hard to swallow, sinus congestion, headache, fatigue and cough.  No high fevers or myalgias.  No n/v/d.        Depression History:      The patient denies a depressed mood most of the day and a diminished interest in her usual daily activities.         Preventive Screening-Counseling & Management  Alcohol-Tobacco     Alcohol drinks/day: 0     Smoking Status: current     Smoking Cessation Counseling: yes     Packs/Day: 3/4 ppd     Year Started:  1960  Caffeine-Diet-Exercise     Caffeine use/day: 2 cups of coffee and soda     Does Patient Exercise: no     Type of exercise: walking stairs at home     Times/week: 7  Medications Prior to Update: 1)  Alprazolam 0.5 Mg Tabs (Alprazolam) .Marland Kitchen.. 1 1/2 Tablets Three Times Daily 2)  Lisinopril 40 Mg Tabs (Lisinopril) .... Take 1 Tablet By Mouth Once A Day 3)  Synthroid 150 Mcg Tabs (Levothyroxine Sodium) .... Take One Tablet Daily For Thyroid. 4)  Cardizem La 180 Mg Xr24h-Tab (Diltiazem Hcl Coated Beads) .... Take 1 Tablet By Mouth Once A Day 5)  Aspirin 81 Mg Tbec (Aspirin) .... Take 1 Tablet By Mouth Once A Day 6)  Fosamax 35 Mg Tabs (Alendronate Sodium) .... Take One Tablet By Mouth Once A Week, With [employer]. Do Not Lay Down For 30 Minutes After Swallowing The Tablet. 7)  Lac-Hydrin Five 5 % Lotn (Ammonium Lactate) .... Apply Two Times A Day. 8)  Hydrochlorothiazide 12.5 Mg Tabs (Hydrochlorothiazide) .... Take One Tablet Daily For Blood Pressure. 9)  Vitamin D (Ergocalciferol) 50000 Unit Caps (Ergocalciferol) .... Take One Tablet Weekly For 8 Weeks.  Allergies: No Known Drug Allergies  Social History: Packs/Day:  3/4 ppd  Review of Systems       per hpi  Physical Exam  General:  alert and well-developed.   Mouth:  good dentition, pharynx pink and moist, no exudates, pharyngeal erythema, and posterior lymphoid hypertrophy.   Lungs:  normal respiratory effort and normal breath sounds.   Heart:  normal rate, regular rhythm, and no murmur.   Abdomen:  soft, non-tender, normal bowel sounds, and no distention.   Neurologic:  alert & oriented X3, cranial nerves II-XII intact, and gait normal.   Psych:  Oriented X3, memory intact for recent and remote, normally interactive, good eye contact, and not anxious appearing.     Impression & Recommendations:  Problem # 1:  HYPERTENSION (ICD-401.9) BP improved. No changes. Get a BMET at next appointment.  Her updated medication  list for this problem includes:    Lisinopril 40 Mg Tabs (Lisinopril) .Marland Kitchen... Take 1 tablet by mouth once a day    Cardizem La 180 Mg Xr24h-tab (Diltiazem hcl coated beads) .Marland Kitchen... Take 1 tablet by mouth once a day    Hydrochlorothiazide 12.5 Mg Tabs (Hydrochlorothiazide) .Marland Kitchen... Take one tablet daily for blood pressure.  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 I will look into this for the next appointment.  Her updated medication list for this problem includes:    Fosamax 35 Mg Tabs (Alendronate sodium) .Marland Kitchen... Take one tablet by mouth once a week, with plenty of water. do not lay down for 30 minutes after swallowing the tablet.  Problem # 3:  PHARYNGITIS (ICD-462) thinks that she has strep, has sore throat, several family members have documented strep. will treat with amoxicillin for 10 days. also with general URI symptoms of stuffiness so could have strep, or viral illness or both.   Her updated medication list for this problem includes:    Aspirin 81 Mg Tbec (Aspirin) .Marland Kitchen... Take 1 tablet by mouth once a day    Amoxicillin 875 Mg Tabs (Amoxicillin) .Marland Kitchen... Take one tablet two times a day for 10 days.  Problem # 4:  TOBACCO ABUSE (ICD-305.1) tried the electronic cigs but they made her cough. She will try another brand as she thought this was a good strategy.  Problem # 5:  VITAMIN D DEFICIENCY (ICD-268.9) is taking vit d suppliments with no problem. recheck at next visit.  Problem # 6:  TRANSAMINASES, SERUM, ELEVATED (ICD-790.4) much better on last check.  almost normal. etiology still unknown.  Problem # 7:  HYPOTHYROIDISM (ICD-244.9) have  just increased synthroid dose.  recheck TSH at the end of march.  Her updated medication list for this problem includes:    Synthroid 150 Mcg Tabs (Levothyroxine sodium) .Marland Kitchen... Take one tablet daily for thyroid.  Complete Medication List: 1)  Alprazolam 0.5 Mg Tabs (Alprazolam) .Marland Kitchen.. 1 1/2 tablets three times daily 2)  Lisinopril 40 Mg Tabs (Lisinopril) .... Take 1 tablet by mouth once a day 3)  Synthroid 150 Mcg Tabs (Levothyroxine sodium) .... Take one tablet daily for thyroid. 4)  Cardizem La 180 Mg Xr24h-tab (Diltiazem hcl coated beads) .... Take 1 tablet by mouth once a day 5)  Aspirin 81 Mg Tbec (Aspirin) .... Take 1 tablet by mouth once a day 6)  Fosamax 35 Mg Tabs (Alendronate sodium) .... Take one tablet by mouth once a week, with plenty of water. do not lay down for 30 minutes after swallowing the tablet. 7)  Lac-hydrin Five 5 % Lotn (Ammonium lactate) .... Apply two times a day. 8)  Hydrochlorothiazide 12.5 Mg Tabs (Hydrochlorothiazide) .... Take one tablet daily for blood pressure. 9)  Vitamin D (ergocalciferol) 50000 Unit Caps (Ergocalciferol) .... Take one tablet weekly for 8 weeks. 10)  Amoxicillin 875 Mg Tabs (Amoxicillin) .... Take one tablet two times a day for 10 days.  Patient Instructions: 1)  You have a new prescription for amoxicillin for strep throat.  You should rest and stay hydrated.  Use tylenol for pain and fever. 2)  Please return to clinic in one month for labs to check your thyroid. Prescriptions: AMOXICILLIN 875 MG TABS (AMOXICILLIN) Take one tablet two times a day for 10 days.  #10 x 0   Entered and Authorized by:   Elby Showers MD   Signed by:   Stanton Kidney Ditzler RN on 01/06/2010   Method used:   Electronically to        [facility]* (retail)       [address]       [place], [place]  [address]       Ph: [phone]       Fax: [phone]   RxID:   [id number]    Prevention & Chronic Care Immunizations    Influenza vaccine: Not documented    Tetanus booster: 06/26/2006: Td    Pneumococcal vaccine: Pneumovax  (08/09/2009)    H. zoster vaccine: Not documented  Colorectal Screening   Hemoccult: Negative  (11/28/2004)   Hemoccult action/deferral: Ordered  (12/19/2009)    Colonoscopy: Not documented   Colonoscopy action/deferral: Refused  (09/12/2009)  Other Screening   Pap smear: Normal  (08/29/2004)   Pap smear action/deferral: Not indicated-other  (08/09/2009)    Mammogram: ASSESSMENT: Negative - BI-RADS 1^MM DIGITAL SCREENING  (08/12/2009)   Mammogram action/deferral: Ordered  (08/09/2009)    DXA bone density scan:  Lumbar Spine:  T Score-2.5 to -1.0 Spine.   Hip Total: T Score -2.5 to -1.0 Hip.      (07/26/2008)   DXA scan due: 07/2010    Smoking status: current  (01/06/2010)   Smoking cessation counseling: yes  (01/06/2010)  Lipids   Total Cholesterol: 131  (12/19/2009)   Lipid panel action/deferral: Lipid Panel ordered   LDL: 70  (12/19/2009)   LDL Direct: Not documented   HDL: 52  (12/19/2009)   Triglycerides: 47  (12/19/2009)  Hypertension   Last Blood Pressure: 137 / 74  (01/06/2010)   Serum creatinine: 0.85  (12/19/2009)   BMP action: Ordered   Serum potassium 3.8  (12/19/2009)  Self-Management Support :   Personal Goals (by the next clinic visit) :      Personal blood pressure goal: 130/80  (09/12/2009)   Hypertension self-management support: Written self-care plan  (09/12/2009)

## 2010-12-26 NOTE — Progress Notes (Signed)
Summary: Refill/gh  Phone Note Refill Request Message from:  Fax from Pharmacy on March 20, 2010 11:09 AM  Refills Requested: Medication #1:  LISINOPRIL 40 MG TABS Take 1 tablet by mouth once a day   Last Refilled: 02/17/2010  Method Requested: Electronic Initial call taken by: Angelina Ok RN,  March 20, 2010 11:10 AM    Prescriptions: LISINOPRIL 40 MG TABS (LISINOPRIL) Take 1 tablet by mouth once a day  #30 x 6   Entered and Authorized by:   Elby Showers MD   Signed by:   Elby Showers MD on 03/21/2010   Method used:   Electronically to        [facility]* (retail)       [address]       [place], [place]  [address]       Ph: [phone]       Fax: [phone]   RxID:   [id number]

## 2010-12-26 NOTE — Progress Notes (Signed)
Summary: Medication Clarification  Phone Note Refill Request Message from:  Pharmacy on October 04, 2010 9:41 AM  Refills Requested: Medication #1:  NEOMYCIN-POLYMYXIN B GU 40-200000 SOLN Apply one drop to Right eye three times a day for 5 days. Call from phramacy not sure which medication you wanted.  Need a call fro Clarification.  Pharmacy is at [phone].   Method Requested: Electronic Initial call taken by: Angelina Ok RN,  October 04, 2010 9:42 AM  Follow-up for Phone Call        Diltiazem 60 mg Po daily. Spoke with the patient and clarified the dose of medications. Thank you. Follow-up by: Deatra Robinson MD,  October 07, 2010 1:08 PM

## 2010-12-26 NOTE — Assessment & Plan Note (Signed)
Summary: REASSIGNED NEW TO DR/CFB   Vital Signs:  Patient profile:   70 year old female Height:      69 inches (175.26 cm) Weight:      164.9 pounds (74.95 kg) BMI:     24.44 Temp:     97.4 degrees F oral Pulse rate:   68 / minute BP sitting:   117 / 66  (right arm)  Vitals Entered By: Chinita Pester RN (June 13, 2010 2:09 PM) CC: New to MD.  Requests bloodwork.  Medication refills. Is Patient Diabetic? No Pain Assessment Patient in pain? no      Nutritional Status BMI of 19 -24 = normal  Have you ever been in a relationship where you felt threatened, hurt or afraid?No   Does patient need assistance? Functional Status Self care Ambulation Normal   Primary Care Provider:  Elby Showers MD  CC:  New to MD.  Requests bloodwork.  Medication refills..  History of Present Illness: follow up appointment: 1. TSH and LFT's recheckd ->please, refer to previous OV note. 2. C/o right shin pain sp cutting herslef with a piece of a broken glass 3 weeks ago. Although the abrasion is healing; continues to have pain and "swelling" at the site of injury.  Depression History:      The patient denies a depressed mood most of the day and a diminished interest in her usual daily activities.        Comments:  "Just stressed sometimes".   Preventive Screening-Counseling & Management  Alcohol-Tobacco     Alcohol drinks/day: 0     Smoking Status: current     Smoking Cessation Counseling: yes     Packs/Day: 1.5     Year Started: 1960  Caffeine-Diet-Exercise     Caffeine use/day: 2 cups of coffee and soda     Does Patient Exercise: no     Type of exercise: walking stairs at home     Times/week: 7  Allergies: No Known Drug Allergies  Past History:  Social History: Last updated: 06/16/2009 Current Smoker 1 1/2ppd lives with her son and his family which is stressful retired Production designer, theatre/television/film, Merchandiser, retail  Risk Factors: Smoking Status: current (06/13/2010) Packs/Day: 1.5  (06/13/2010)  Past medical, surgical, family and social histories (including risk factors) reviewed for relevance to current acute and chronic problems.  Past Medical History: Reviewed history from 12/13/2006 and no changes required. 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 left endarterectomy (LICA)  left breast Bx - 2002 benign surgery for glaucoma, cataract  Family History: Reviewed history from 06/16/2009 and no changes required. no CAD RA runs in her family breast cancer in her sister in her 80's  Social History: Reviewed history from 06/16/2009 and no changes required. Current Smoker 1 1/2ppd lives with her son and his family which is stressful retired Production designer, theatre/television/film, Merchandiser, retail Packs/Day:  1.5  Review of Systems       per HPI  Physical Exam  General:  alert and well-developed.   Head:  normocephalic and atraumatic.   Eyes:  vision grossly intact, pupils equal, pupils round, and pupils reactive to light.   Ears:  no external deformities.   Nose:  no external deformity.   Mouth:  good dentition, pharynx pink and moist, no erythema, no exudates, and  no posterior lymphoid hypertrophy.   Neck:  supple, full ROM, no masses, and no carotid bruits. CEA scar on left is noted.  Lungs:  normal respiratory effort and normal breath sounds.   Heart:  normal rate, regular rhythm, and no murmur.   Abdomen:  soft, non-tender, normal bowel sounds, no distention, no masses, and no guarding.   Rectal:  refused Genitalia:  refused Msk:  No deformity or scoliosis noted of thoracic or lumbar spine.   Pulses:  R and L carotid,radial,femoral,dorsalis pedis and posterior tibial pulses are full and equal bilaterally Extremities:  No  clubbing, cyanosis, edema, or deformity noted with normal full range of motion of all joints.   Neurologic:  No cranial nerve deficits noted. Station and gait are normal. Plantar reflexes are down-going bilaterally. DTRs are symmetrical throughout. Sensory, motor and coordinative functions appear intact. Skin:  Right shin healing abrasion 1 cmx 1 cm in diameter. TTP with some underlying induration. Psych:  Cognition and judgment appear intact. Alert and cooperative with normal attention span and concentration. No apparent delusions, illusions, hallucinations   Impression & Recommendations:  Problem # 1:  SKIN LESION (ICD-709.9)  right shin abrasion sp cutting herself with piece of picture frameglass 3 weeks ago. Given induration and increased TTP --will order XR to evaluate for foreign body.  Orders: Diagnostic X-Ray/Fluoroscopy (Diagnostic X-Ray/Flu)  Problem # 2:  TRANSAMINASES, SERUM, ELEVATED (ICD-790.4)  Likely 2/2 cholelithiasis. Status post cholecystectomyin May of 2011 by Dr. [facility]. Will recheck LFt's today  Orders: T-Hepatic Function [phone])  Problem # 3:  HYPOTHYROIDISM (ICD-244.9)  Will recheck TSH given new doseof Synthroid since 01/2010. Her updated medication list for this problem includes:    Synthroid 150 Mcg Tabs (Levothyroxine sodium) .Marland Kitchen... Take one tablet daily for thyroid.  Labs Reviewed: TSH: 2.976 (02/13/2010)    Chol: 131 (12/19/2009)   HDL: 52 (12/19/2009)   LDL: 70 (12/19/2009)   TG: 47 (12/19/2009)  Orders: T-TSH ([id number])  Problem # 4:  TOBACCO ABUSE (ICD-305.1)  Encouraged smoking cessation and discussed different methods for smoking cessation.   Problem # 5:  Preventive Health Care (ICD-V70.0)  Reviewed all recommended preventative care reccomendations. Strongly advised to have a colonsocpy given a new hx of consitpation (without any other Sx).  Orders: Hemoccult Cards (Take Home) (Hemoccult  Cards)  Complete Medication List: 1)  Alprazolam 0.5 Mg Tabs (Alprazolam) .Marland Kitchen.. 1 1/2 tablets three times daily 2)  Lisinopril 40 Mg Tabs (Lisinopril) .... Take 1 tablet by mouth once a day 3)  Synthroid 150 Mcg Tabs (Levothyroxine sodium) .... Take one tablet daily for thyroid. 4)  Cardizem La 180 Mg Xr24h-tab (Diltiazem hcl coated beads) .... Take 1 tablet by mouth once a day 5)  Aspirin 81 Mg Tbec (Aspirin) .... Take 1 tablet by mouth once a day 6)  Fosamax 35 Mg Tabs (Alendronate sodium) .... Take one tablet by mouth once a week, with plenty of water. do not lay down for 30 minutes after swallowing the tablet. 7)  Hydrochlorothiazide 12.5 Mg Tabs (Hydrochlorothiazide) .... Take one tablet daily for blood pressure. 8)  Calcium-vitamin D 600-200 Mg-unit Tabs (Calcium-vitamin d) .... Take one tablet two times a day.  Other Orders: T-Hemoccult Card-Multiple (take home) ([id number])  Patient Instructions: 1)  Please, follow up with all preventative health care recommendations. 2)  Please, follow up with Dr. Concha Norway 3months or sooner.  Prevention & Chronic Care Immunizations   Influenza vaccine: Not documented   Influenza vaccine deferral: Deferred  (  06/13/2010)   Influenza vaccine due: 07/27/2010    Tetanus booster: 06/26/2006: Td    Pneumococcal vaccine: Pneumovax  (08/09/2009)    H. zoster vaccine: Not documented   H. zoster vaccine deferral: Not available  (06/13/2010)  Colorectal Screening   Hemoccult: Negative  (11/28/2004)   Hemoccult action/deferral: Ordered  (06/13/2010)   Hemoccult due: 06/14/2011    Colonoscopy: Not documented   Colonoscopy action/deferral: Refused  (09/12/2009)  Other Screening   Pap smear: Normal  (08/29/2004)   Pap smear action/deferral: Not indicated-other  (08/09/2009)    Mammogram: ASSESSMENT: Negative - BI-RADS 1^MM DIGITAL SCREENING  (08/12/2009)   Mammogram action/deferral: Ordered  (08/09/2009)   Mammogram due: 08/12/2010    DXA bone  density scan:  Lumbar Spine:  T Score-2.5 to -1.0 Spine.   Hip Total: T Score -2.5 to -1.0 Hip.      (07/26/2008)   DXA bone density action/deferral: Refused  (06/13/2010)   DXA scan due: 07/2010   Reports requested:   Last Pap report requested.  Smoking status: current  (06/13/2010)   Smoking cessation counseling: yes  (06/13/2010)  Lipids   Total Cholesterol: 131  (12/19/2009)   Lipid panel action/deferral: Lipid Panel ordered   LDL: 70  (12/19/2009)   LDL Direct: Not documented   HDL: 52  (12/19/2009)   Triglycerides: 47  (12/19/2009)  Hypertension   Last Blood Pressure: 117 / 66  (06/13/2010)   Serum creatinine: 0.91  (02/13/2010)   BMP action: Ordered   Serum potassium 4.0  (02/13/2010)    Hypertension flowsheet reviewed?: Yes   Progress toward BP goal: At goal  Self-Management Support :   Personal Goals (by the next clinic visit) :      Personal blood pressure goal: 130/80  (09/12/2009)   Patient will work on the following items until the next clinic visit to reach self-care goals:     Medications and monitoring: bring all of my medications to every visit  (06/13/2010)     Eating: eat foods that are low in salt, eat baked foods instead of fried foods  (06/13/2010)     Activity: take a 30 minute walk every day  (06/13/2010)    Hypertension self-management support: Written self-care plan  (06/13/2010)   Hypertension self-care plan printed.   Nursing Instructions: Provide Hemoccult cards with instructions (see order) Request report of last Pap   Process Orders Check Orders Results:     [facility]: Check successful Tests Sent for requisitioning (June 13, 2010 3:20 PM):     06/13/2010: [facility] -- T-TSH [phone] (signed)     06/13/2010: [facility] -- T-Hepatic Function [phone] (signed)

## 2010-12-26 NOTE — Assessment & Plan Note (Signed)
Summary: CHECKUP/SB.   Vital Signs:  Patient profile:   70 year old female Height:      69 inches (175.26 cm) Weight:      160.7 pounds (73.05 kg) BMI:     23.82 Temp:     97.4 degrees F oral Pulse rate:   71 / minute Pulse (ortho):   74 / minute BP sitting:   118 / 67  (right arm) BP standing:   107 / 67  (right arm) Cuff size:   regular  Vitals Entered By: Chinita Pester RN (October 03, 2010 1:46 PM) CC: Check-up.  c/o dizziness, no energy and no appetite. Flu shot. Right eye red. Is Patient Diabetic? No Pain Assessment Patient in pain? no      Nutritional Status BMI of 19 -24 = normal  Have you ever been in a relationship where you felt threatened, hurt or afraid?No   Does patient need assistance? Functional Status Self care Ambulation Normal   Primary Care Provider:  Elby Showers MD  CC:  Check-up.  c/o dizziness and no energy and no appetite. Flu shot. Right eye red.Marland Kitchen  History of Present Illness: Patient is here for: 1. f/u on Hypothyroidism. Patient denies increase in anxiety or heart palpitations; dipahoresis. 2. Patient reports increase in dizziness, no vertigo; worse with changing position. Patient takes all her anti-HTN meds as Rx-ed. Denies any HA, trauma, fever, chills or any other Sx.No tinnitus. 3. C/o 2 days of OD redness, burning sensation and matting in am. Denies any visual changes, pain, fever, or chills.  Depression History:      The patient denies a depressed mood most of the day and a diminished interest in her usual daily activities.         Preventive Screening-Counseling & Management  Alcohol-Tobacco     Alcohol drinks/day: sometimes at night     Alcohol type: red wine     Smoking Status: current     Smoking Cessation Counseling: yes     Packs/Day: 1.5     Year Started: 1961  Caffeine-Diet-Exercise     Caffeine use/day: 2 cups of coffee and soda     Does Patient Exercise: no     Type of exercise: walking stairs at home  Times/week: 7  Problems Prior to Update: 1)  Conjunctivitis, Acute, Right  (ICD-372.00) 2)  Ankle Sprain, Left  (ICD-845.00) 3)  Skin Lesion  (ICD-709.9) 4)  Pharyngitis  (ICD-462) 5)  Fatigue  (ICD-780.79) 6)  Cholelithiasis, Asymptomatic  (ICD-574.20) 7)  Osteopenia - Spine and Hip  (ICD-733.90) 8)  Vitamin D Deficiency  (ICD-268.9) 9)  Transaminases, Serum, Elevated  (ICD-790.4) 10)  Abscess, Tooth  (ICD-522.5) 11)  Preventive Health Care  (ICD-V70.0) 12)  Postmenopausal Status  (ICD-627.2) 13)  Tobacco Abuse  (ICD-305.1) 14)  Sinusitis  (ICD-473.9) 15)  Dysuria, Hx of  (ICD-V13.09) 16)  Aftercare, Long-term Use, Medications Nec  (ICD-V58.69) 17)  Glaucoma Nos  (ICD-365.9) 18)  Transient Ischemic Attack, Hx of  (ICD-V12.50) 19)  Hypothyroidism  (ICD-244.9) 20)  Hypertension  (ICD-401.9) 21)  Depression  (ICD-311) 22)  Anxiety  (ICD-300.00)  Allergies (verified): 1)  ! * Lactose Intolerance  Past History:  Past medical, surgical, family and social histories (including risk factors) reviewed for relevance to current acute and chronic problems.  Past Medical History: Reviewed history from 12/13/2006 and no changes required. 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 left endarterectomy (LICA)  left breast Bx - 2002 benign surgery for glaucoma, cataract  Family History: Reviewed history from 06/16/2009 and no changes required. no CAD RA runs in her family breast cancer in her sister in her 12's  Social History: Reviewed history from 06/16/2009 and no changes required. Current Smoker 1 1/2ppd lives with her son and his family which is stressful retired Production designer, theatre/television/film, Merchandiser, retail  Review of Systems  The patient  denies anorexia, fever, weight loss, weight gain, chest pain, syncope, dyspnea on exertion, peripheral edema, headaches, and abdominal pain.    Physical Exam  General:  alert and well-developed.   Head:  normocephalic and atraumatic.   Eyes:  vision grossly intact, pupils equal, pupils round, and pupils reactive to light.  No nystagmus. Ears:  no external deformities.   Nose:  no external deformity.   Mouth:  good dentition, pharynx pink and moist, no erythema, no exudates, and no posterior lymphoid hypertrophy.   Neck:  supple, full ROM, no masses, and no carotid bruits. CEA scar on left is noted.  Lungs:  Normal respiratory effort, chest expands symmetrically. Lungs are clear to auscultation, no crackles or wheezes. Heart:  Normal rate and regular rhythm. S1 and S2 normal without gallop, murmur, click, rub or other extra sounds. Abdomen:  Bowel sounds positive,abdomen soft and non-tender without masses, organomegaly or hernias noted.  Msk:  No deformity or scoliosis noted of thoracic or lumbar spine.   Pulses:  R and L ,radial,femoral,dorsalis pedis and posterior tibial pulses are full and equal bilaterally Extremities:  No clubbing, cyanosis, edema, or deformity noted with normal full range of motion of all joints.   Neurologic:  alert & oriented X3, cranial nerves II-XII intact, strength normal in all extremities, sensation intact to light touch, sensation intact to pinprick, gait normal, DTRs symmetrical and normal, finger-to-nose normal, heel-to-shin normal, toes down bilaterally on Babinski, and Romberg negative.   Skin:  turgor normal, no rashes, no suspicious lesions, and no petechiae.   Cervical Nodes:  no anterior cervical adenopathy.   Psych:  Oriented X3, memory intact for recent and remote, normally interactive, good eye contact, not depressed appearing, not agitated, not suicidal, not homicidal, and slightly anxious.     Impression & Recommendations:  Problem # 1:   TRANSAMINASES, SERUM, ELEVATED (ICD-790.4) Assessment Comment Only Per patient, had a liver Bx in July of 2011  which was negative. Will request an official consult reports from [facility]. Last LFT's were checked in Summer of 2011.  Problem # 2:  HYPERTENSION (ICD-401.9) Patient is with orthostatic hypotension.Patient denies any CP, SOB, PND, or leg swelling. Will  D/C HCTZ and decrease Cardizem dose to 60 mg PO daily. Will check ECG.  Fall precautions given.  The following medications were removed from the medication list:    Hydrochlorothiazide 12.5 Mg Tabs (Hydrochlorothiazide) .Marland Kitchen... Take one tablet daily for blood pressure. Her updated medication list for this problem includes:    Lisinopril 40 Mg Tabs (Lisinopril) .Marland Kitchen... Take 1 tablet by mouth once a day    Cardizem 60 Mg Tabs (Diltiazem hcl) .Marland Kitchen... Take one tablet by mouth daily  Orders: T-TSH ([id number])  BP today: 118/67 Prior BP: 107/63 (06/28/2010)  Prior 10 Yr Risk Heart Disease: 11 % (09/12/2009)  Labs Reviewed: K+: 4.0 (02/13/2010) Creat: : 0.91 (02/13/2010)   Chol: 131 (12/19/2009)   HDL: 52 (  12/19/2009)   LDL: 70 (12/19/2009)   TG: 47 (12/19/2009)  Problem # 3:  CONJUNCTIVITIS, ACUTE, RIGHT (ICD-372.00) Assessment: New Will treat with cortisporin ophthalmic drops. Discussed treatment, and urged patient to wash hands carefully after touching face.   Problem # 4:  HYPOTHYROIDISM (ICD-244.9) Assessment: Improved  Last TSH drawn in July that was indicative of overcorrection. Synthroid dose was decreased from1102mcg down to 125 mcg at her last appointment.  No dose adjustment of Synthroid dose is indicated at this time. Patient is instructed to call with any concerns. Her updated medication list for this problem includes:    Synthroid 125 Mcg Tabs (Levothyroxine sodium) .Marland Kitchen... Take 1 tablet by mouth once a day  Orders: T-TSH ([id number])  Labs Reviewed: TSH: 0.206 (06/14/2010)    Chol: 131 (12/19/2009)   HDL: 52  (12/19/2009)   LDL: 70 (12/19/2009)   TG: 47 (12/19/2009)  Complete Medication List: 1)  Alprazolam 0.5 Mg Tabs (Alprazolam) .Marland Kitchen.. 1 1/2 tablets three times daily 2)  Lisinopril 40 Mg Tabs (Lisinopril) .... Take 1 tablet by mouth once a day 3)  Synthroid 125 Mcg Tabs (Levothyroxine sodium) .... Take 1 tablet by mouth once a day 4)  Cardizem 60 Mg Tabs (Diltiazem hcl) .... Take one tablet by mouth daily 5)  Aspirin 81 Mg Tbec (Aspirin) .... Take 1 tablet by mouth once a day 6)  Fosamax 35 Mg Tabs (Alendronate sodium) .... Take one tablet by mouth once a week, with plenty of water. do not lay down for 30 minutes after swallowing the tablet. 7)  Calcium-vitamin D 600-200 Mg-unit Tabs (Calcium-vitamin d) .... Take one tablet two times a day. 8)  Neomycin-polymyxin B Gu 40-200000 Soln (Neomycin-polymyxin b gu) .... Apply one drop to right eye three times a day for 5 days  Other Orders: Mammogram (Screening) (Mammo) Flu Vaccine 17yrs + MEDICARE PATIENTS (K7425) Administration Flu vaccine - MCR (G0008) Tdap => 3yrs IM (95638) Admin 1st Vaccine (75643)  Patient Instructions: 1)  Please, stop taking Hydrochlorothiazide. 2)  Please, note a change in Cardizem -->new dose is 60 mg tablet once a day. Your new prescription was sent to the pharmacy. 3)  Please, pick your prescription for an eye infection. Please, return to clinic if no improvment or feeling worse. 4)  Call with any questions. 5)  Please, follow up in 1-2 weeks. Prescriptions: CARDIZEM 60 MG TABS (DILTIAZEM HCL) Take one tablet by mouth daily  #30 x 11   Entered and Authorized by:   Deatra Robinson MD   Signed by:   Deatra Robinson MD on 10/03/2010   Method used:   Electronically to        [facility]* (retail)       [address]       [place], [place]  [address]       Ph: [phone]       Fax: [phone]   RxID:   [id number] NEOMYCIN-POLYMYXIN B GU 40-200000 SOLN (NEOMYCIN-POLYMYXIN B GU)  Apply one drop to Right eye three times a day for 5 days  #1 vial x 0   Entered and Authorized by:   Deatra Robinson MD   Signed by:   Deatra Robinson MD on 10/03/2010   Method used:   Electronically to        [facility]* (retail)       [address]       [place]  [place], [place]  [address]       Ph: [phone]       Fax: [phone]   RxID:   [id number]    Orders Added: 1)  Est. Patient Level IV [10272] 2)  Mammogram (Screening) [Mammo] 3)  Flu Vaccine 19yrs + MEDICARE PATIENTS [Q2039] 4)  Administration Flu vaccine - MCR [G0008] 5)  Tdap => 63yrs IM [90715] 6)  Admin 1st Vaccine [90471] 7)  T-TSH [53664-40347]   Immunizations Administered:  Tetanus Vaccine:    Vaccine Type: Tdap    Site: left deltoid    Mfr: GlaxoSmithKline    Dose: 0.5 ml    Route: IM    Given by: Chinita Pester RN    Exp. Date: 09/14/2012    Lot #: [id number]    VIS given: 10/13/08 version given October 04, 2010.   Immunizations Administered:  Tetanus Vaccine:    Vaccine Type: Tdap    Site: left deltoid    Mfr: GlaxoSmithKline    Dose: 0.5 ml    Route: IM    Given by: Chinita Pester RN    Exp. Date: 09/14/2012    Lot #: [id number]    VIS given: 10/13/08 version given October 04, 2010.  Prevention & Chronic Care Immunizations   Influenza vaccine: Fluvax 3+  (10/03/2010)   Influenza vaccine deferral: Deferred  (06/13/2010)   Influenza vaccine due: 07/27/2010    Tetanus booster: 10/03/2010: Tdap   Td booster deferral: Deferred  (06/28/2010)   Tetanus booster due: 06/26/2016    Pneumococcal vaccine: Pneumovax  (08/09/2009)   Pneumococcal vaccine deferral: Deferred  (06/28/2010)   Pneumococcal vaccine due: 08/09/2014    H. zoster vaccine: Not documented   H. zoster vaccine deferral: Not available  (06/13/2010)  Colorectal Screening   Hemoccult: Negative  (11/28/2004)   Hemoccult action/deferral: Refused  (10/03/2010)   Hemoccult due: 06/14/2011     Colonoscopy: Not documented   Colonoscopy action/deferral: Refused  (09/12/2009)  Other Screening   Pap smear: Normal  (08/29/2004)   Pap smear action/deferral: Not indicated-other  (08/09/2009)    Mammogram: ASSESSMENT: Negative - BI-RADS 1^MM DIGITAL SCREENING  (08/12/2009)   Mammogram action/deferral: Ordered  (10/03/2010)   Mammogram due: 08/12/2010    DXA bone density scan:  Lumbar Spine:  T Score-2.5 to -1.0 Spine.   Hip Total: T Score -2.5 to -1.0 Hip.      (07/26/2008)   DXA bone density action/deferral: Refused  (06/13/2010)   DXA scan due: 07/2010    Smoking status: current  (10/03/2010)   Smoking cessation counseling: yes  (10/03/2010)  Lipids   Total Cholesterol: 131  (12/19/2009)   Lipid panel action/deferral: Lipid Panel ordered   LDL: 70  (12/19/2009)   LDL Direct: Not documented   HDL: 52  (12/19/2009)   Triglycerides: 47  (12/19/2009)   Lipid panel due: 12/19/2010  Hypertension   Last Blood Pressure: 118 / 67  (10/03/2010)   Serum creatinine: 0.91  (02/13/2010)   BMP action: Ordered   Serum potassium 4.0  (02/13/2010)   Basic metabolic panel due: 02/14/2011    Hypertension flowsheet reviewed?: Yes   Progress toward BP goal: Unchanged    Stage of readiness to change (hypertension management): Maintenance  Self-Management Support :   Personal Goals (by the next clinic visit) :      Personal blood pressure goal: 130/80  (09/12/2009)   Patient will work on the following items until the next clinic visit to reach self-care goals:  Medications and monitoring: take my medicines every day, bring all of my medications to every visit  (10/03/2010)     Eating: eat foods that are low in salt, eat baked foods instead of fried foods  (10/03/2010)     Activity: take a 30 minute walk every day  (06/13/2010)    Hypertension self-management support: Education handout, Resources for patients handout  (06/28/2010)   Nursing Instructions: Give Flu vaccine  today Schedule screening mammogram (see order)  Flu Vaccine Consent Questions     Do you have a history of severe allergic reactions to this vaccine? no    Any prior history of allergic reactions to egg and/or gelatin? no    Do you have a sensitivity to the preservative Thimersol? no    Do you have a past history of Guillan-Barre Syndrome? no    Do you currently have an acute febrile illness? no    Have you ever had a severe reaction to latex? no    Vaccine information given and explained to patient? yes    Are you currently pregnant? no    Lot Number:[id number]   Exp Date:05/26/2011   Manufacturer: [employer]    Site Given  RightDeltoid IM  Chinita Pester RN  October 03, 2010 3:26 PM e screening mammogram (see order)   Process Orders Check Orders Results:     [facility]: Check successful Tests Sent for requisitioning (October 09, 2010 8:02 AM):     10/03/2010: [facility] -- T-TSH [phone] (signed)      .medflu

## 2010-12-26 NOTE — Progress Notes (Signed)
Summary: ?UTI  Phone Note Call from Patient   Caller: Patient Call For: Deatra Robinson MD Complaint: Breathing Problems Summary of Call: Call from pt said that she has a UTI.  Has burning, dribbling and back pain.   No feverwhen asked.  Wants to know if they will go with the knowledge that she has on her self or would she need to come iin and give a urine specimen.  Pt was given an appoinment for 06/28/2010.Angelina Ok RN  June 27, 2010 3:46 PM  Initial call taken by: Angelina Ok RN,  June 27, 2010 3:33 PM  Follow-up for Phone Call        Provider Notified, Appt Scheduled Follow-up by: Deatra Robinson MD,  June 28, 2010 10:28 AM

## 2010-12-26 NOTE — Letter (Signed)
Summary: [facility]  [facility]   Imported By: Louretta Parma 10/26/2010 16:23:10  _____________________________________________________________________  External Attachment:    Type:   Image     Comment:   External Document

## 2010-12-26 NOTE — Assessment & Plan Note (Signed)
Summary: ? BLADDER INFECTION/DS/   Vital Signs:  Patient profile:   70 year old female Height:      69 inches Weight:      166.5 pounds BMI:     24.68 Temp:     98.8 degrees F oral Pulse rate:   72 / minute BP sitting:   107 / 63  (right arm)  Vitals Entered By: Filomena Jungling NT II (June 28, 2010 2:28 PM) CC: ? UTI, Depression Pain Assessment Patient in pain? yes     Location: back Intensity: 4 Type: aching Onset of pain  STARTED IN MARCH/ BUT THIS WEEK  Nutritional Status BMI of 19 -24 = normal  Have you ever been in a relationship where you felt threatened, hurt or afraid?No   Does patient need assistance? Functional Status Self care Ambulation Normal   Primary Care Provider:  Elby Showers MD  CC:  ? UTI and Depression.  History of Present Illness: C/o dyruria, frequency, urgency and LBP for 3 days. Hx of frequent UTI's. "feels the same." Denies fever, chills, rash, hematuria, pyuria. No other concerns.  Depression History:      The patient denies a depressed mood most of the day and a diminished interest in her usual daily activities.         Preventive Screening-Counseling & Management  Alcohol-Tobacco     Alcohol drinks/day: 0     Smoking Status: current     Smoking Cessation Counseling: yes     Packs/Day: 1.5     Year Started: 1960  Caffeine-Diet-Exercise     Caffeine use/day: 2 cups of coffee and soda     Does Patient Exercise: no     Type of exercise: walking stairs at home     Times/week: 7  Problems Prior to Update: 1)  Ankle Sprain, Left  (ICD-845.00) 2)  Skin Lesion  (ICD-709.9) 3)  Pharyngitis  (ICD-462) 4)  Fatigue  (ICD-780.79) 5)  Cholelithiasis, Asymptomatic  (ICD-574.20) 6)  Osteopenia - Spine and Hip  (ICD-733.90) 7)  Vitamin D Deficiency  (ICD-268.9) 8)  Transaminases, Serum, Elevated  (ICD-790.4) 9)  Abscess, Tooth  (ICD-522.5) 10)  Preventive Health Care  (ICD-V70.0) 11)  Postmenopausal Status  (ICD-627.2) 12)  Tobacco  Abuse  (ICD-305.1) 13)  Sinusitis  (ICD-473.9) 14)  Dysuria, Hx of  (ICD-V13.09) 15)  Aftercare, Long-term Use, Medications Nec  (ICD-V58.69) 16)  Glaucoma Nos  (ICD-365.9) 17)  Transient Ischemic Attack, Hx of  (ICD-V12.50) 18)  Hypothyroidism  (ICD-244.9) 19)  Hypertension  (ICD-401.9) 20)  Depression  (ICD-311) 21)  Anxiety  (ICD-300.00)  Current Problems (verified): 1)  Ankle Sprain, Left  (ICD-845.00) 2)  Skin Lesion  (ICD-709.9) 3)  Pharyngitis  (ICD-462) 4)  Fatigue  (ICD-780.79) 5)  Cholelithiasis, Asymptomatic  (ICD-574.20) 6)  Osteopenia - Spine and Hip  (ICD-733.90) 7)  Vitamin D Deficiency  (ICD-268.9) 8)  Transaminases, Serum, Elevated  (ICD-790.4) 9)  Abscess, Tooth  (ICD-522.5) 10)  Preventive Health Care  (ICD-V70.0) 11)  Postmenopausal Status  (ICD-627.2) 12)  Tobacco Abuse  (ICD-305.1) 13)  Sinusitis  (ICD-473.9) 14)  Dysuria, Hx of  (ICD-V13.09) 15)  Aftercare, Long-term Use, Medications Nec  (ICD-V58.69) 16)  Glaucoma Nos  (ICD-365.9) 17)  Transient Ischemic Attack, Hx of  (ICD-V12.50) 18)  Hypothyroidism  (ICD-244.9) 19)  Hypertension  (ICD-401.9) 20)  Depression  (ICD-311) 21)  Anxiety  (ICD-300.00)  Medications Prior to Update: 1)  Alprazolam 0.5 Mg Tabs (Alprazolam) .Marland Kitchen.. 1 1/2 Tablets Three Times  Daily 2)  Lisinopril 40 Mg Tabs (Lisinopril) .... Take 1 Tablet By Mouth Once A Day 3)  Synthroid 125 Mcg Tabs (Levothyroxine Sodium) .... Take 1 Tablet By Mouth Once A Day 4)  Cardizem La 180 Mg Xr24h-Tab (Diltiazem Hcl Coated Beads) .... Take 1 Tablet By Mouth Once A Day 5)  Aspirin 81 Mg Tbec (Aspirin) .... Take 1 Tablet By Mouth Once A Day 6)  Fosamax 35 Mg Tabs (Alendronate Sodium) .... Take One Tablet By Mouth Once A Week, With [employer]. Do Not Lay Down For 30 Minutes After Swallowing The Tablet. 7)  Hydrochlorothiazide 12.5 Mg Tabs (Hydrochlorothiazide) .... Take One Tablet Daily For Blood Pressure. 8)  Calcium-Vitamin D 600-200 Mg-Unit Tabs  (Calcium-Vitamin D) .... Take One Tablet Two Times A Day.  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 Depression had 2 doses of Hep B vaccine and will need the third one between 02-06/2007  Past Surgical History: Last updated: 12/13/2006 left endarterectomy (LICA)  left breast Bx - 2002 benign surgery for glaucoma, cataract  Family History: Last updated: 06/16/2009 no CAD RA runs in her family breast cancer in her sister in her 58's  Social History: Last updated: 06/16/2009 Current Smoker 1 1/2ppd lives with her son and his family which is stressful retired Production designer, theatre/television/film, Merchandiser, retail  Risk Factors: Alcohol Use: 0 (06/28/2010) Caffeine Use: 2 cups of coffee and soda (06/28/2010) Exercise: no (06/28/2010)  Risk Factors: Smoking Status: current (06/28/2010) Packs/Day: 1.5 (06/28/2010)  Review of Systems       per HPI  Physical Exam  General:  alert and well-developed.   Head:  normocephalic and atraumatic.   Eyes:  vision grossly intact, pupils equal, pupils round, and pupils reactive to light.   Ears:  no external deformities.   Nose:  no external deformity.   Mouth:  good dentition, pharynx pink and moist, no erythema, no exudates, and no posterior lymphoid hypertrophy.   Neck:  supple, full ROM, no masses, and no carotid bruits. CEA scar on left is noted.  Lungs:  Normal respiratory effort, chest expands symmetrically. Lungs are clear to auscultation, no crackles or wheezes. Heart:  Normal rate and regular rhythm. S1 and S2 normal without gallop, murmur, click, rub or other extra sounds. Abdomen:  Bowel sounds positive,abdomen soft and non-tender without masses, organomegaly or hernias noted. Suprapubic TTP noted. Msk:  No deformity  or scoliosis noted of thoracic or lumbar spine.   Pulses:  R and L carotid,radial,femoral,dorsalis pedis and posterior tibial pulses are full and equal bilaterally Extremities:  No clubbing, cyanosis, edema, or deformity noted with normal full range of motion of all joints.   Neurologic:  No cranial nerve deficits noted. Station and gait are normal. Plantar reflexes are down-going bilaterally. DTRs are symmetrical throughout. Sensory, motor and coordinative functions appear intact. Skin:  Intact without suspicious lesions or rashes Cervical Nodes:  No lymphadenopathy noted Inguinal Nodes:  No significant adenopathy Psych:  Cognition and judgment appear intact. Alert and cooperative with normal attention span and concentration. No apparent delusions, illusions, hallucinations   Impression & Recommendations:  Problem # 1:  DYSURIA, HX OF (ICD-V13.09)  Although UA is neg, likely UTI per HPI and exam. Bactrim for 3 days; hydration;  personal hygiene reviewed. May use OTC AZO as directed. F/u in 5-7  days if no improvment.  Orders: T-Culture, Urine ([id number])  Problem # 2:  TOBACCO ABUSE (ICD-305.1)  Encouraged smoking cessation and discussed different methods for smoking cessation.   Orders: Social Work Referral (Social )  Complete Medication List: 1)  Alprazolam 0.5 Mg Tabs (Alprazolam) .Marland Kitchen.. 1 1/2 tablets three times daily 2)  Lisinopril 40 Mg Tabs (Lisinopril) .... Take 1 tablet by mouth once a day 3)  Synthroid 125 Mcg Tabs (Levothyroxine sodium) .... Take 1 tablet by mouth once a day 4)  Cardizem La 180 Mg Xr24h-tab (Diltiazem hcl coated beads) .... Take 1 tablet by mouth once a day 5)  Aspirin 81 Mg Tbec (Aspirin) .... Take 1 tablet by mouth once a day 6)  Fosamax 35 Mg Tabs (Alendronate sodium) .... Take one tablet by mouth once a week, with plenty of water. do not lay down for 30 minutes after swallowing the tablet. 7)  Hydrochlorothiazide 12.5 Mg Tabs (Hydrochlorothiazide)  .... Take one tablet daily for blood pressure. 8)  Calcium-vitamin D 600-200 Mg-unit Tabs (Calcium-vitamin d) .... Take one tablet two times a day. 9)  Sulfamethoxazole-tmp Ds 800-160 Mg Tabs (Sulfamethoxazole-trimethoprim) .... Take 1 tablet by mouth two times a day for 3 days  Patient Instructions: 1)  Ple, take Bactrim as prescribed for bladder infection. 2)  Drink plenty of water. 3)  Call with any concerns. 4)  Follow up in 7 days if no improvement. Prescriptions: SULFAMETHOXAZOLE-TMP DS 800-160 MG TABS (SULFAMETHOXAZOLE-TRIMETHOPRIM) Take 1 tablet by mouth two times a day for 3 days  #6 x 0   Entered and Authorized by:   Deatra Robinson MD   Signed by:   Deatra Robinson MD on 06/28/2010   Method used:   Electronically to        [facility]* (retail)       [address]       [place], [place]  [address]       Ph: [phone]       Fax: [phone]   RxID:   [id number]   Prevention & Chronic Care Immunizations   Influenza vaccine: Not documented   Influenza vaccine deferral: Deferred  (06/13/2010)   Influenza vaccine due: 07/27/2010    Tetanus booster: 06/26/2006: Td   Td booster deferral: Deferred  (06/28/2010)   Tetanus booster due: 06/26/2016    Pneumococcal vaccine: Pneumovax  (08/09/2009)   Pneumococcal vaccine deferral: Deferred  (06/28/2010)   Pneumococcal vaccine due: 08/09/2014    H. zoster vaccine: Not documented   H. zoster vaccine deferral: Not available  (06/13/2010)  Colorectal Screening   Hemoccult: Negative  (11/28/2004)   Hemoccult action/deferral: Ordered  (06/13/2010)   Hemoccult due: 06/14/2011    Colonoscopy: Not documented   Colonoscopy action/deferral: Refused  (09/12/2009)  Other Screening   Pap smear: Normal  (08/29/2004)   Pap smear action/deferral: Not indicated-other  (08/09/2009)    Mammogram: ASSESSMENT: Negative - BI-RADS 1^MM DIGITAL SCREENING  (08/12/2009)   Mammogram action/deferral: Ordered   (08/09/2009)   Mammogram due: 08/12/2010    DXA bone density scan:  Lumbar Spine:  T Score-2.5 to -1.0 Spine.   Hip Total: T Score -2.5 to -1.0 Hip.      (07/26/2008)   DXA bone density action/deferral: Refused  (06/13/2010)   DXA scan due: 07/2010    Smoking status: current  (06/28/2010)   Smoking cessation counseling: yes  (06/28/2010)  Lipids   Total Cholesterol: 131  (12/19/2009)  Lipid panel action/deferral: Lipid Panel ordered   LDL: 70  (12/19/2009)   LDL Direct: Not documented   HDL: 52  (12/19/2009)   Triglycerides: 47  (12/19/2009)  Hypertension   Last Blood Pressure: 107 / 63  (06/28/2010)   Serum creatinine: 0.91  (02/13/2010)   BMP action: Ordered   Serum potassium 4.0  (02/13/2010)    Hypertension flowsheet reviewed?: Yes   Progress toward BP goal: At goal    Stage of readiness to change (hypertension management): Maintenance  Self-Management Support :   Personal Goals (by the next clinic visit) :      Personal blood pressure goal: 130/80  (09/12/2009)   Patient will work on the following items until the next clinic visit to reach self-care goals:     Medications and monitoring: take my medicines every day  (06/28/2010)     Eating: eat foods that are low in salt, eat baked foods instead of fried foods  (06/28/2010)     Activity: take a 30 minute walk every day  (06/13/2010)    Hypertension self-management support: Education handout, Resources for patients handout  (06/28/2010)   Hypertension education handout printed      Resource handout printed.  Process Orders Check Orders Results:     [facility]: Order checked:     70010 -- T-Culture, Urine -- ABN required due to diagnosis (CPT: [id number]) Tests Sent for requisitioning (June 28, 2010 4:22 PM):     06/28/2010: [facility] -- T-Culture, Urine [[id number]] (signed)     Laboratory Results   Urine Tests  Date/Time Received: 06/28/2010  Routine Urinalysis     Color: yellow Appearance: Hazy Glucose: negative   (Normal Range: Negative) Bilirubin: negative   (Normal Range: Negative) Ketone: negative   (Normal Range: Negative) Spec. Gravity: 1.020   (Normal Range: 1.003-1.035) Blood: negative   (Normal Range: Negative) pH: 5.5   (Normal Range: 5.0-8.0) Protein: negative   (Normal Range: Negative) Urobilinogen: 0.2   (Normal Range: 0-1) Nitrite: negative   (Normal Range: Negative) Leukocyte Esterace: negative   (Normal Range: Negative)

## 2010-12-26 NOTE — Assessment & Plan Note (Signed)
Summary: est-ck/fu/meds/cfb   Vital Signs:  Patient profile:   70 year old female Height:      69 inches (175.26 cm) Weight:      174.0 pounds (79.09 kg) BMI:     25.79 Temp:     96.6 degrees F (35.89 degrees C) oral Pulse rate:   62 / minute BP sitting:   142 / 82  (left arm)  Vitals Entered By: Stanton Kidney Ditzler RN (December 19, 2009 3:05 PM) Is Patient Diabetic? No Pain Assessment Patient in pain? no      Nutritional Status BMI of 25 - 29 = overweight Nutritional Status Detail appetite down  Have you ever been in a relationship where you felt threatened, hurt or afraid?denies   Does patient need assistance? Functional Status Self care Ambulation Normal Comments 3 month FU - heels sore in AM and more freq leg cramps since last vs.   Primary Care Provider:  Elby Showers MD   History of Present Illness: This is a  year old woman with past medical history outlined in the chart who is here for check up.  She has new complaint of leg cramps especially at night which cause her toes to overlap and get stuck.  Cramps are relieved by stretching the foot.    Depression History:      The patient denies a depressed mood most of the day and a diminished interest in her usual daily activities.         Preventive Screening-Counseling & Management  Alcohol-Tobacco     Alcohol drinks/day: 0     Smoking Status: current     Smoking Cessation Counseling: yes     Packs/Day: 1     Year Started: 1960  Caffeine-Diet-Exercise     Caffeine use/day: 2 cups of coffee and soda     Does Patient Exercise: no     Type of exercise: walking stairs at home     Times/week: 7  Current Medications (verified): 1)  Alprazolam 0.5 Mg Tabs (Alprazolam) .Marland Kitchen.. 1 1/2 Tablets Three Times Daily 2)  Lisinopril 40 Mg Tabs (Lisinopril) .... Take 1 Tablet By Mouth Once A Day 3)  Synthroid 125 Mcg  Tabs (Levothyroxine Sodium) .... Take 1 Tablet By Mouth Once A Day 4)  Cardizem La 180 Mg Xr24h-Tab (Diltiazem  Hcl Coated Beads) .... Take 1 Tablet By Mouth Once A Day 5)  Aspirin 81 Mg Tbec (Aspirin) .... Take 1 Tablet By Mouth Once A Day 6)  Fosamax 35 Mg Tabs (Alendronate Sodium) .... Take One Tablet By Mouth Once A Week, With [employer]. Do Not Lay Down For 30 Minutes After Swallowing The Tablet. 7)  Lac-Hydrin Five 5 % Lotn (Ammonium Lactate) .... Apply Two Times A Day.  Allergies (verified): No Known Drug Allergies  Review of Systems       per hpi  Physical Exam  General:  alert and well-developed.   Lungs:  normal respiratory effort and normal breath sounds.   Heart:  normal rate, regular rhythm, and no murmur.   Pulses:  2+ Extremities:  no edema Neurologic:  alert & oriented X3, cranial nerves II-XII intact, strength normal in all extremities, and gait normal.   Skin:  no rashes and no suspicious lesions.   Psych:  Oriented X3, memory intact for recent and remote, normally interactive, good eye contact, and not anxious appearing.     Impression & Recommendations:  Problem # 1:  HYPERTENSION (ICD-401.9) BP is elevated today.  She  had been on hctz but was taken off while we were trying to figure out the cause of her skin rash.  I will restart today and will recheck BP and BMET in two weeks.  Her updated medication list for this problem includes:    Lisinopril 40 Mg Tabs (Lisinopril) .Marland Kitchen... Take 1 tablet by mouth once a day    Cardizem La 180 Mg Xr24h-tab (Diltiazem hcl coated beads) .Marland Kitchen... Take 1 tablet by mouth once a day    Hydrochlorothiazide 12.5 Mg Tabs (Hydrochlorothiazide) .Marland Kitchen... Take one tablet daily for blood pressure.  BP today: 142/82 Prior BP: 130/68 (09/12/2009)  Prior 10 Yr Risk Heart Disease: 11 % (09/12/2009)  Labs Reviewed: K+: 4.1 (09/12/2009) Creat: : 0.90 (09/12/2009)   Chol: 124 (03/04/2008)   HDL: 45 (03/04/2008)   LDL: 68 (03/04/2008)   TG: 56 (03/04/2008)  Orders: T-Comprehensive Metabolic Panel ([id number])  Problem # 2:  TOBACCO ABUSE  (ICD-305.1) Is trying smokeless cigarettes.  I encouraged her to use what ever means to quit smoking and encouraged her to continue.  Problem # 3:  VITAMIN D DEFICIENCY (ICD-268.9) will check level today.  Orders: T-Vitamin D (25-Hydroxy) [id number])  Problem # 4:  TRANSAMINASES, SERUM, ELEVATED (ICD-790.4) will check CMET today.  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  Problem # 5:  HYPOTHYROIDISM (ICD-244.9) check levels today. skin very dry and itchy... but much better after scabes treated.  Her updated medication list for this problem includes:    Synthroid 125 Mcg Tabs (Levothyroxine sodium) .Marland Kitchen... Take 1 tablet by mouth once a day  Orders: T-CBC w/Diff ([id number]) T-TSH ([id number])  Problem # 6:  SKIN LESION (ICD-709.9) Finally diagnosed with scabes by dermatology.  Treated and feeling much better.  Complete Medication List: 1)  Alprazolam 0.5 Mg Tabs (Alprazolam) .Marland Kitchen.. 1 1/2 tablets three times daily 2)  Lisinopril 40 Mg Tabs (Lisinopril) .... Take 1 tablet by mouth once a day 3)  Synthroid 125 Mcg Tabs (Levothyroxine sodium) .... Take 1 tablet by mouth once a day 4)  Cardizem La 180 Mg Xr24h-tab (Diltiazem hcl coated beads) .... Take 1 tablet by mouth once a day 5)  Aspirin 81 Mg Tbec (Aspirin) .... Take 1 tablet by mouth once a day 6)  Fosamax 35 Mg Tabs (Alendronate sodium) .... Take one tablet by mouth once a week, with plenty of water. do not lay down for 30 minutes after swallowing the tablet. 7)  Lac-hydrin Five 5 % Lotn (Ammonium lactate) .... Apply two times a day. 8)  Hydrochlorothiazide 12.5 Mg Tabs (Hydrochlorothiazide) .... Take one tablet daily for blood pressure.  Other Orders: T-Lipid Profile ([id number]) T-Hemoccult Card-Multiple (take home) (02725)  Patient Instructions: 1)  You had labwork done today, we will call you if there is anything that needs to be addressed before your next appointment. 2)  Please schedule a follow-up appointment in 2 weeks  for blood pressure recheck. Prescriptions: FOSAMAX 35 MG TABS (ALENDRONATE SODIUM) take one tablet by mouth once a week, with plenty of water. Do not lay down for 30 minutes after swallowing the tablet.  #5 x 11   Entered and Authorized by:   Elby Showers MD   Signed by:   Elby Showers MD on 12/19/2009   Method used:   Electronically to        [facility]* (retail)       [address]       [place], [place]  [address]       Ph: [phone]       Fax: [phone]   RxID:   [id number] CARDIZEM LA 180 MG XR24H-TAB (DILTIAZEM HCL COATED BEADS) Take 1 tablet by mouth once a day  #30 x 5   Entered and Authorized by:   Elby Showers MD   Signed by:   Elby Showers MD on 12/19/2009   Method used:   Electronically to        [facility]* (retail)       [address]       [place], [place]  [address]       Ph: [phone]       Fax: [phone]   RxID:   [id number] SYNTHROID 125 MCG  TABS (LEVOTHYROXINE SODIUM) Take 1 tablet by mouth once a day  #31 x 11   Entered and Authorized by:   Elby Showers MD   Signed by:   Elby Showers MD on 12/19/2009   Method used:   Electronically to        [facility]* (retail)       [address]       [place], [place]  [address]       Ph: [phone]       Fax: [phone]   RxID:   [id number] LISINOPRIL 40 MG TABS (LISINOPRIL) Take 1 tablet by mouth once a day  #30 x 5   Entered and Authorized by:   Elby Showers MD   Signed by:   Elby Showers MD on 12/19/2009   Method used:   Electronically to        [facility]* (retail)       [address]       [place], [place]  [address]       Ph: [phone]       Fax: [phone]   RxID:   [id number] HYDROCHLOROTHIAZIDE 12.5 MG TABS (HYDROCHLOROTHIAZIDE) Take one tablet daily for blood pressure.  #32 x 3   Entered and Authorized by:   Elby Showers MD   Signed by:   Elby Showers MD on 12/19/2009   Method used:   Electronically to        [facility]* (retail)       [address]       [place], [place]  [address]       Ph: [phone]       Fax: [phone]   RxID:   [id number]  Process Orders Check Orders Results:     [facility]: Check successful Tests Sent for requisitioning (December 19, 2009 7:44 PM):     12/19/2009: [facility] -- T-Lipid Profile [id number] (signed)     12/19/2009: [facility] -- T-Comprehensive Metabolic Panel [[id number]] (signed)     12/19/2009: [facility] -- T-CBC w/Diff [[id number]] (signed)     12/19/2009: [facility] -- T-TSH [phone] (signed)     12/19/2009: [facility] -- T-Vitamin D (25-Hydroxy) [phone] (signed)    Prevention & Chronic Care Immunizations   Influenza vaccine: Not documented    Tetanus booster: 06/26/2006: Td    Pneumococcal vaccine: Pneumovax  (08/09/2009)    H. zoster vaccine: Not documented  Colorectal Screening   Hemoccult: Negative  (  11/28/2004)   Hemoccult action/deferral: Ordered  (12/19/2009)    Colonoscopy: Not documented   Colonoscopy action/deferral: Refused  (09/12/2009)  Other Screening   Pap smear: Normal  (08/29/2004)   Pap smear action/deferral: Not indicated-other  (08/09/2009)    Mammogram: ASSESSMENT: Negative - BI-RADS 1^MM DIGITAL SCREENING  (08/12/2009)   Mammogram action/deferral: Ordered  (08/09/2009)    DXA bone density scan:  Lumbar Spine:  T Score-2.5 to -1.0 Spine.   Hip Total: T Score -2.5 to -1.0 Hip.      (07/26/2008)   DXA scan due: 07/2010    Smoking status: current  (12/19/2009)   Smoking cessation counseling: yes  (12/19/2009)  Lipids   Total Cholesterol: 124  (03/04/2008)   Lipid panel action/deferral: Lipid Panel ordered   LDL: 68  (03/04/2008)   LDL Direct: Not  documented   HDL: 45  (03/04/2008)   Triglycerides: 56  (03/04/2008)  Hypertension   Last Blood Pressure: 142 / 82  (12/19/2009)   Serum creatinine: 0.90  (09/12/2009)   BMP action: Ordered   Serum potassium 4.1  (09/12/2009) CMP ordered     Hypertension flowsheet reviewed?: Yes   Progress toward BP goal: Deteriorated  Self-Management Support :   Personal Goals (by the next clinic visit) :      Personal blood pressure goal: 130/80  (09/12/2009)   Patient will work on the following items until the next clinic visit to reach self-care goals:     Medications and monitoring: take my medicines every day, bring all of my medications to every visit  (12/19/2009)     Eating: drink diet soda or water instead of juice or soda, eat more vegetables, use fresh or frozen vegetables, eat foods that are low in salt, eat fruit for snacks and desserts, limit or avoid alcohol  (12/19/2009)     Activity: take a 30 minute walk every day, take the stairs instead of the elevator  (12/19/2009)    Hypertension self-management support: Written self-care plan  (09/12/2009)   Nursing Instructions: Provide Hemoccult cards with instructions (see order)

## 2011-01-30 ENCOUNTER — Encounter: Payer: Self-pay | Admitting: Internal Medicine

## 2011-01-30 ENCOUNTER — Ambulatory Visit (INDEPENDENT_AMBULATORY_CARE_PROVIDER_SITE_OTHER): Payer: MEDICARE | Admitting: Internal Medicine

## 2011-01-30 VITALS — BP 140/68 | HR 53 | Temp 96.9°F | Ht 67.0 in | Wt 161.6 lb

## 2011-01-30 DIAGNOSIS — Z818 Family history of other mental and behavioral disorders: Secondary | ICD-10-CM

## 2011-01-30 DIAGNOSIS — R0602 Shortness of breath: Secondary | ICD-10-CM

## 2011-01-30 DIAGNOSIS — E039 Hypothyroidism, unspecified: Secondary | ICD-10-CM

## 2011-01-30 MED ORDER — FLUTICASONE PROPIONATE HFA 44 MCG/ACT IN AERO: 1.0000 | INHALATION_SPRAY | Freq: Two times a day (BID) | RESPIRATORY_TRACT | Status: DC

## 2011-01-30 NOTE — Patient Instructions (Addendum)
Please, take your medications as prescribed. Please, make an appointment with Ms. Tessiatore ASAP. Please, fill in a prescription for a flovent and use as prescribed. Please, STOP SMOKING!!!! Please, call with any questions and follow up in 3-4 months.

## 2011-01-30 NOTE — Progress Notes (Signed)
  Subjective:    Patient ID: [name], female    DOB: July 15, 1941, 70 y.o.   MRN: [id number]  HPI Patient is here "for a check up."   Review of Systems  Constitutional: Positive for fatigue. Negative for fever, activity change and appetite change.  HENT: Negative for ear pain, congestion, rhinorrhea, sneezing, neck pain, neck stiffness and postnasal drip.   Eyes: Negative for photophobia and visual disturbance.  Respiratory: Positive for cough (dry cough "while smoking."). Negative for choking, chest tightness, shortness of breath, wheezing and stridor.   Cardiovascular: Negative for chest pain, palpitations and leg swelling.  Gastrointestinal: Negative for abdominal distention.  Genitourinary: Negative for hematuria.  Musculoskeletal: Negative for joint swelling and arthralgias.  Neurological: Negative for dizziness, tremors, seizures, syncope, facial asymmetry, speech difficulty, weakness, light-headedness, numbness and headaches (However, reported HA to Ms. Palmer, RN at the time of checking in.).  Hematological: Negative for adenopathy. Does not bruise/bleed easily.  Psychiatric/Behavioral: Negative for hallucinations, behavioral problems, confusion, dysphoric mood, decreased concentration and agitation.       Objective:   Physical Exam  Constitutional: She is oriented to person, place, and time. She appears well-developed. She appears toxic (Strong alcohol smell on her breath).  HENT:  Right Ear: External ear normal.  Left Ear: External ear normal.  Nose: Nose normal.  Mouth/Throat: Oropharynx is clear and moist.  Eyes: Conjunctivae are normal. Pupils are equal, round, and reactive to light.  Neck: Normal range of motion. No JVD present. No thyromegaly present.  Cardiovascular: Normal rate, regular rhythm, normal heart sounds and intact distal pulses.   Pulmonary/Chest: Effort normal and breath sounds normal.  Abdominal: Soft. Bowel sounds are normal. She exhibits  distension. There is no tenderness. There is no rebound.  Musculoskeletal: Normal range of motion.  Lymphadenopathy:    She has no cervical adenopathy.  Neurological: She is alert and oriented to person, place, and time. She has normal reflexes. No cranial nerve deficit.  Skin: Skin is warm.  Psychiatric: She has a normal mood and affect. Her behavior is normal.          Assessment & Plan:  1. Hypothyroidism. Will recheck TSH today. 2. Depression/Anxiety . Denies SI/HI or mania. Continue with Alprazolam refilled today with 3 RF. 3. HTN with a hx of hypotension. Improved control. 4. ? ETOH abuse --patient denies and refused to have ETOH levels checked. Referred for counselling. 5. Smoker -- ? COPD. Start empiric treatment with a Flovent. Consider PFT's; smoking cessation counselling. Provided. 6. Family stress. Referred to a Child psychotherapist. Patient denies any emotional, physical danger to herself or anyone in the household.

## 2011-02-02 ENCOUNTER — Telehealth: Payer: Self-pay | Admitting: Licensed Clinical Social Worker
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
## 2011-02-12 LAB — COMPREHENSIVE METABOLIC PANEL
Albumin: 3.9 g/dL (ref 3.5–5.2)
BUN: 11 mg/dL (ref 6–23)
Creatinine, Ser: 0.81 mg/dL (ref 0.4–1.2)
Glucose, Bld: 88 mg/dL (ref 70–99)
Total Protein: 6.9 g/dL (ref 6.0–8.3)

## 2011-02-12 LAB — CBC
HCT: 44.7 % (ref 36.0–46.0)
Hemoglobin: 15.6 g/dL — ABNORMAL HIGH (ref 12.0–15.0)
MCHC: 35 g/dL (ref 30.0–36.0)
MCV: 96.2 fL (ref 78.0–100.0)
Platelets: 233 10*3/uL (ref 150–400)
RDW: 13.5 % (ref 11.5–15.5)

## 2011-02-12 LAB — DIFFERENTIAL
Basophils Absolute: 0.1 10*3/uL (ref 0.0–0.1)
Basophils Relative: 1 % (ref 0–1)
Lymphocytes Relative: 42 % (ref 12–46)
Monocytes Absolute: 0.4 10*3/uL (ref 0.1–1.0)
Monocytes Relative: 5 % (ref 3–12)
Neutro Abs: 4.4 10*3/uL (ref 1.7–7.7)
Neutrophils Relative %: 51 % (ref 43–77)

## 2011-02-13 ENCOUNTER — Other Ambulatory Visit: Payer: Self-pay | Admitting: *Deleted

## 2011-02-19 LAB — HEPATIC FUNCTION PANEL
Albumin: 3.7 g/dL (ref 3.5–5.2)
Alkaline Phosphatase: 84 U/L (ref 39–117)
Indirect Bilirubin: 0.5 mg/dL (ref 0.3–0.9)
Total Bilirubin: 0.7 mg/dL (ref 0.3–1.2)
Total Protein: 6.7 g/dL (ref 6.0–8.3)

## 2011-02-19 LAB — BASIC METABOLIC PANEL
BUN: 16 mg/dL (ref 6–23)
Chloride: 101 mEq/L (ref 96–112)
GFR calc non Af Amer: >60 mL/min (ref >60)
Glucose, Bld: 116 mg/dL — ABNORMAL HIGH (ref 70–99)
Potassium: 3.5 mEq/L (ref 3.5–5.1)
Sodium: 138 mEq/L (ref 135–145)

## 2011-02-19 LAB — POCT CARDIAC MARKERS: Myoglobin, poc: 48.3 ng/mL (ref 12–200)

## 2011-02-19 MED ORDER — ALPRAZOLAM 0.5 MG PO TABS: 0.7500 mg | ORAL_TABLET | Freq: Three times a day (TID) | ORAL | Status: DC

## 2011-02-19 NOTE — Telephone Encounter (Signed)
Called to pharm 

## 2011-02-19 NOTE — Telephone Encounter (Addendum)
Last visit was 12/11. Visit in 01/2011 stated refilled with 2 refills but I cannot see where the refill was done in [facility]. Has been on this dose of Xanax since at least 2008 with no monitoring - no UDS, no contract, never addressed in the office notes. Pt is working with Lupita Leash T. Since it is a chronic med, will refill but ask pt be sch next month or two to address appropriateness of med and get contract and UDS.

## 2011-02-21 ENCOUNTER — Other Ambulatory Visit: Payer: Self-pay | Admitting: *Deleted

## 2011-02-21 NOTE — Telephone Encounter (Signed)
Pt is requesting more refills on Xanax (currently 0 refills).  Also pt request a change in dosage of Cardizem; she was on 180mg  but was decreased to 60mg  at her last office visit.  States she has been checking her BP at [facility] and it has been in the range of 140's/60's. And for yrs her BP has been 120-125/71-73; and she has been feeling dizzy on the lower dosage. Requests to increase to 90mg  or back to 180mg .  Pt.'s telephone # [phone].

## 2011-02-24 MED ORDER — LISINOPRIL 40 MG PO TABS: 40.0000 mg | ORAL_TABLET | Freq: Every day | ORAL | Status: DC

## 2011-02-24 MED ORDER — ALPRAZOLAM 0.5 MG PO TABS: 0.7500 mg | ORAL_TABLET | Freq: Three times a day (TID) | ORAL | Status: DC

## 2011-02-24 MED ORDER — DILTIAZEM HCL 60 MG PO TABS: 60.0000 mg | ORAL_TABLET | Freq: Every day | ORAL | Status: DC

## 2011-02-28 NOTE — Telephone Encounter (Signed)
I talked to Dr. Denton Meek about pt.'s BP concerns; MD stated she will not change Cardizem and if she has more questions to make an appt. I called and talked to  Ms. Lavey;stated she will call the clinic back and make an appt. (pt.not at home at this time).

## 2011-03-11 ENCOUNTER — Encounter: Payer: Self-pay | Admitting: Internal Medicine

## 2011-03-22 ENCOUNTER — Encounter: Payer: Self-pay | Admitting: Internal Medicine

## 2011-03-23 ENCOUNTER — Other Ambulatory Visit: Payer: Self-pay | Admitting: *Deleted

## 2011-03-23 NOTE — Telephone Encounter (Signed)
Pt calls and states she only got #60 xanax when she used the script she had been given by dr Denton Meek, i called the pharm and pharm states it was handwritten 01/30/2011 #60 w/ 3 additional refills, pt states she always gets #135 and pharm confirms, i will send a refill request but i cannot find a record of the handwritten script, i may have not looked correctly.

## 2011-03-24 MED ORDER — ALPRAZOLAM 0.5 MG PO TABS: 0.7500 mg | ORAL_TABLET | Freq: Three times a day (TID) | ORAL | Status: DC

## 2011-03-27 NOTE — Telephone Encounter (Signed)
Dr. Denton Meek called [facility] about Xanax. Pt made aware.

## 2011-04-02 ENCOUNTER — Other Ambulatory Visit: Payer: Self-pay | Admitting: Internal Medicine

## 2011-04-13 NOTE — Op Note (Signed)
[facility]  Patient:    [name], [name]. Visit Number: [id number] MRN: [id number]          Service Type: Attending:  Zigmund Daniel, M.D. Dictated by:   Zigmund Daniel, M.D. Proc. Date: 10/31/01                             Operative Report  PREOPERATIVE DIAGNOSIS:  Left internal carotid artery stenosis.  POSTOPERATIVE DIAGNOSIS:  Left internal carotid artery stenosis.  OPERATION:  Left carotid endarterectomy.  SURGEON:  Zigmund Daniel, M.D.  ASSISTANT:  Thornton Park. Daphine Deutscher, M.D.  ANESTHESIA:  General.  DESCRIPTION OF PROCEDURE:  After adequate monitoring and anesthesia, and routine preparation with draping of the left side of the neck, I made a mid neck incision paralleling the anterior border of the sternocleidomastoid, and slightly over the muscle.  I tried to keep the incision away from the angle of the jaw to avoid injury of the marginal mandibular nerve.  I deepened the dissection toward the carotid pulse, encountering and dividing the common facial vein after ligating it in continuity with 3-0 silk.  I then dissected down on the carotids and controlled the common external and internal carotids with vessel loops, and I also controlled the superior thyroid artery.  I took care to protect the hyperglossal nerve and the vagus nerve.  After the patient was heparinized and the heparin was allowed to circulate, I tightened the controls, and clamped the common carotid.  I then made a longitudinal arteriotomy on the anterior surface of the carotid, going from the common into the internal carotid, pass the stenotic plaque.  I then put in a straight shunt and flushed from the internal carotid back and then restored flow through the shunt, controlling it with the vessel loops.  I then proceeded to remove the plaque from the vessel in a leisurely fashion, beginning in the common carotid and working upward.  I divided the plaque when I  dissected just pass the external carotid and then introsuscepted the plaque back from the external carotid and removed it, allowed the external carotid to bleed back, and then filled it with heparinized saline and controlled the vessel again with the vessel loop.  I then carefully extended the endarterectomy into the internal carotid and divided the plaque as it narrowed down into more normal intima.  On the posterior surface of the vessel, I tacked the intima and remaining plaque down with several sutures of 6-0 Prolene, placed through-and-through the vessel and tied on the outside. This appeared to nicely hold the plaque down to prevent dissection.  I then removed loose material from the endarterectomy site.  I closed the endarterectomy by doing a patch angioplasty with a piece of Dacron patch, the Finesse-type preclotted material.  I sutured the closure line further as needed when the flow was restored.  When a few sutures remained to be put in, I removed the shunt, and then allowed flushing of the vessels again, and removed the clot or blood from the vessel.  I then restored the flow first into the external carotid and then into the internal carotid after finishing the suture line.  The flow and pulse in both vessels was excellent.  Hemostasis was good.  I closed the wound with two layers of running 3-0 Vicryl, and closed the skin with intercuticular 4-0 Vicryl and Steri-Strips.  Sponge, needle, and instrument counts were correct.  The patient tolerated the operation well. Dictated by:   Zigmund Daniel, M.D. Attending:  Zigmund Daniel, M.D. DD:  04/06/02 TD:  04/08/02 Job: [id number]

## 2011-05-23 ENCOUNTER — Other Ambulatory Visit: Payer: Self-pay | Admitting: *Deleted

## 2011-05-24 MED ORDER — LEVOTHYROXINE SODIUM 125 MCG PO TABS: 125.0000 ug | ORAL_TABLET | Freq: Every day | ORAL | Status: DC

## 2011-06-05 ENCOUNTER — Encounter: Payer: Medicare Other | Admitting: Internal Medicine

## 2011-06-25 ENCOUNTER — Encounter: Payer: Medicare Other | Admitting: Internal Medicine

## 2011-07-05 ENCOUNTER — Ambulatory Visit (INDEPENDENT_AMBULATORY_CARE_PROVIDER_SITE_OTHER): Payer: Medicare Other | Admitting: Internal Medicine

## 2011-07-05 ENCOUNTER — Encounter: Payer: Self-pay | Admitting: Internal Medicine

## 2011-07-05 VITALS — BP 127/73 | HR 65 | Temp 96.5°F | Ht 67.0 in | Wt 151.3 lb

## 2011-07-05 DIAGNOSIS — E039 Hypothyroidism, unspecified: Secondary | ICD-10-CM

## 2011-07-05 DIAGNOSIS — F411 Generalized anxiety disorder: Secondary | ICD-10-CM

## 2011-07-05 DIAGNOSIS — N390 Urinary tract infection, site not specified: Secondary | ICD-10-CM

## 2011-07-05 DIAGNOSIS — I10 Essential (primary) hypertension: Secondary | ICD-10-CM

## 2011-07-05 LAB — URINALYSIS, ROUTINE W REFLEX MICROSCOPIC
Glucose, UA: NEGATIVE mg/dL
Nitrite: NEGATIVE
Specific Gravity, Urine: 1.007 (ref 1.005–1.030)
pH: 6.5 (ref 5.0–8.0)

## 2011-07-05 MED ORDER — CLONAZEPAM 0.5 MG PO TABS: 0.5000 mg | ORAL_TABLET | Freq: Two times a day (BID) | ORAL | Status: DC

## 2011-07-05 MED ORDER — CIPROFLOXACIN HCL 250 MG PO TABS: 250.0000 mg | ORAL_TABLET | Freq: Two times a day (BID) | ORAL | Status: AC

## 2011-07-05 NOTE — Patient Instructions (Signed)
   Please follow-up at the clinic in 1-2 months with your PCP, at which time we will reevaluate your anxiety and stress.  Anxiety -  Please follow-up with Dorothe Pea who will help get you set up for counseling services.  Please try to pick up Klonopin - if it is too expensive, do not fill the prescription - just call the clinic to let me know that you couldn't get it - then we'll have you just restart the Xanax.  If starting the Klonopin, DO NOT take the Xanax too --> you should stop the Xanax  If your feel like hurting yourself or others, please call the clinic or go to the ER.  Urinary tract infection -   Take your antibiotic 2x daily x 3 days.  If you have been started on new medication(s), and you develop throat closing, tongue swelling, rash, please stop the medication and call the clinic at [phone] and go to the ER.  You should be able to pick it up at [facility].   Please bring all of your medications in a bag to your next visit.

## 2011-07-05 NOTE — Assessment & Plan Note (Signed)
Patient indicates significant stress and anxiety surrounding her home situation, particularly she is not get along well with her son-in-law (with whom she lives). His requiring Xanax on a regular basis, at least 3 times daily. Despite this therapy, she continues to have feelings of anxiety and stress. She has not previously tried counseling services, however the patient and I spoke in great detail and at length regarding the role of counseling and therapy in her management. Particularly, the counseling services may prove to be one of the most effective components of her care. - Will place a social work referral with Dorothe Pea to help arrange counseling services for this patient. - She is encouraged to continue with her church support and prayers. - Will DC Xanax - seems to be ineffective for her, and she is taking this on a regular basis instead of as needed. - Will start Clonopin low dose, reassess in one month. The patient prefers to try for at least one month, and consider resuming Xanax if ineffective for her - I am fine with that. - I have sent the Clonopin to her pharmacy, however it may not be covered by her insurance.   If not covered, the patient is instructed to call our clinic to inform us at which time we can dc the order AND she can continue her Xanax at the previously prescribed dosage.  If the Clonopin is covered, we will discontinue the Xanax and continue only Clonopin. - Patient was instructed that if she has worsening of her symptoms, developed suicidal ideation or homicidal ideation, that she should discontinue the Clonopin and call the clinic immediately or report to the ER.
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
## 2011-07-05 NOTE — Assessment & Plan Note (Addendum)
Lab Results  Component Value Date   TSH 0.451 01/30/2011   - Recheck TSH today, adjust therapy if indicated.

## 2011-07-05 NOTE — Assessment & Plan Note (Addendum)
The patient has symptomatic urinary tract infection with dipstick showing trace leukocytes. Will therefore go and treat with antibiotics, as an uncomplicated UTI. - Cipro x3 days.

## 2011-07-05 NOTE — Assessment & Plan Note (Signed)
BP Readings from Last 3 Encounters:  07/05/11 127/73  01/30/11 140/68  10/26/10 100/60    Basic Metabolic Panel:    Component Value Date/Time   NA 139 04/10/2010 1338   K 4.0 04/10/2010 1338   CL 104 04/10/2010 1338   CO2 27 04/10/2010 1338   BUN 11 04/10/2010 1338   CREATININE 0.81 04/10/2010 1338   GLUCOSE 88 04/10/2010 1338   CALCIUM 9.6 04/10/2010 1338    Assessment: Hypertension control:   controlled  Progress toward goals:   at goal Barriers to meeting goals:  no barriers identified  Plan: Hypertension treatment:   - continue current medications

## 2011-07-06 ENCOUNTER — Other Ambulatory Visit: Payer: Self-pay | Admitting: Internal Medicine

## 2011-07-06 DIAGNOSIS — E039 Hypothyroidism, unspecified: Secondary | ICD-10-CM

## 2011-07-06 LAB — URINALYSIS, MICROSCOPIC ONLY
Casts: NONE SEEN
Crystals: NONE SEEN
Squamous Epithelial / LPF: NONE SEEN

## 2011-07-06 LAB — TSH: TSH: 0.151 u[IU]/mL — ABNORMAL LOW (ref 0.350–4.500)

## 2011-07-06 NOTE — Progress Notes (Signed)
I spoke with the patient regarding the results of their recent TSH, which showed that levels were low therefore, her current Synthroid replacement therapy is not adequate Secondary to these results, we will need to increase her current Synthroid to 137 mcg dose. I also spoke with the patient the regarding appropriate Synthroid administration, particularly that it should be taken in the morning 30 minutes before meals and not to be taken with other medications. I spoke with the patient regarding the above mentioned results. All questions were answered and the patient agrees with and understands the plan of care.  The patient was also instructed that she will need to return in 6-8 weeks for follow-up of her TSH to reevaluate levels, with further adjustments as indicated.   Johnette Abraham, D.O.

## 2011-07-07 LAB — URINE CULTURE: Colony Count: 25000

## 2011-07-09 MED ORDER — LEVOTHYROXINE SODIUM 137 MCG PO TABS: 137.0000 ug | ORAL_TABLET | Freq: Every day | ORAL | Status: DC

## 2011-07-17 ENCOUNTER — Telehealth: Payer: Self-pay | Admitting: *Deleted

## 2011-07-17 NOTE — Telephone Encounter (Signed)
Call from pt stating that she was placed on Klonopin at  Her last visit.  Said that her insurance will not cover would like to try something else or go back on the Xanax.  RTC to pt message left that the Clinics had returned her call.  Pt was asked to RTC to the Clinics and to ask for [facility] or a Triage Nurse.

## 2011-07-17 NOTE — Telephone Encounter (Signed)
Noted  

## 2011-08-06 ENCOUNTER — Telehealth: Payer: Self-pay | Admitting: Licensed Clinical Social Worker

## 2011-08-07 NOTE — Telephone Encounter (Signed)
The patient declines counseling once again (see previous note of 3/12) and reported that things were better at home and with family and she was more at peace.   Son in law is less in the picture and has reduced her stress.    Pt was amenable to my sending MH resources for future reference.

## 2011-09-17 ENCOUNTER — Telehealth: Payer: Self-pay | Admitting: *Deleted

## 2011-09-17 ENCOUNTER — Other Ambulatory Visit: Payer: Self-pay | Admitting: *Deleted

## 2011-09-17 DIAGNOSIS — E039 Hypothyroidism, unspecified: Secondary | ICD-10-CM

## 2011-09-17 DIAGNOSIS — F411 Generalized anxiety disorder: Secondary | ICD-10-CM

## 2011-09-17 MED ORDER — LEVOTHYROXINE SODIUM 137 MCG PO TABS: 137.0000 ug | ORAL_TABLET | Freq: Every day | ORAL | Status: DC

## 2011-09-17 MED ORDER — CLONAZEPAM 0.5 MG PO TABS: 0.5000 mg | ORAL_TABLET | Freq: Two times a day (BID) | ORAL | Status: DC

## 2011-09-17 MED ORDER — DILTIAZEM HCL 60 MG PO TABS: 60.0000 mg | ORAL_TABLET | Freq: Three times a day (TID) | ORAL | Status: DC

## 2011-09-17 MED ORDER — LISINOPRIL 40 MG PO TABS: 40.0000 mg | ORAL_TABLET | Freq: Every day | ORAL | Status: DC

## 2011-09-17 NOTE — Telephone Encounter (Signed)
Returned pt's call. Pt stated her insurance will not cover Klonopin and requested refill on Alprazolam.  I called [facility] and they stated her insurance does not cover either medication and cost out of pocket is about the same ($17.00-$18.00). I called pt back and she did not know this and will try the Klonopin.  She has an appt Friday w/Dr. Denton Meek.

## 2011-09-17 NOTE — Telephone Encounter (Signed)
Klonopin rx called to [facility] pharmacy. Rxs. For Cardizem, Levothyroxine,Lisinopril faxed. Also Alprazolam was denied per Dr. Denton Meek; pharmacy made awared.

## 2011-09-17 NOTE — Telephone Encounter (Signed)
Request refill on: Alprazolam 0.5mg   Take 1 &1/2 tablets 3 times daily   Qty 135 Last refilled 08/18/11.

## 2011-09-21 ENCOUNTER — Encounter: Payer: Self-pay | Admitting: Internal Medicine

## 2011-09-21 ENCOUNTER — Other Ambulatory Visit: Payer: Self-pay | Admitting: Internal Medicine

## 2011-09-21 ENCOUNTER — Ambulatory Visit (INDEPENDENT_AMBULATORY_CARE_PROVIDER_SITE_OTHER): Payer: Medicare Other | Admitting: Internal Medicine

## 2011-09-21 ENCOUNTER — Ambulatory Visit: Payer: Medicare Other | Admitting: Licensed Clinical Social Worker

## 2011-09-21 DIAGNOSIS — Z139 Encounter for screening, unspecified: Secondary | ICD-10-CM

## 2011-09-21 DIAGNOSIS — M810 Age-related osteoporosis without current pathological fracture: Secondary | ICD-10-CM

## 2011-09-21 DIAGNOSIS — F419 Anxiety disorder, unspecified: Secondary | ICD-10-CM

## 2011-09-21 DIAGNOSIS — I10 Essential (primary) hypertension: Secondary | ICD-10-CM

## 2011-09-21 DIAGNOSIS — F411 Generalized anxiety disorder: Secondary | ICD-10-CM

## 2011-09-21 LAB — COMPREHENSIVE METABOLIC PANEL
ALT: 12 U/L (ref 0–35)
BUN: 12 mg/dL (ref 6–23)
CO2: 23 mEq/L (ref 19–32)
Calcium: 9.4 mg/dL (ref 8.4–10.5)
Chloride: 104 mEq/L (ref 96–112)
Creat: 0.7 mg/dL (ref 0.50–1.10)
Glucose, Bld: 82 mg/dL (ref 70–99)
Total Bilirubin: 0.5 mg/dL (ref 0.3–1.2)

## 2011-09-21 MED ORDER — AZITHROMYCIN 250 MG PO TABS: ORAL_TABLET | ORAL | Status: AC

## 2011-09-21 MED ORDER — ALPRAZOLAM 0.5 MG PO TABS: 0.7500 mg | ORAL_TABLET | Freq: Three times a day (TID) | ORAL | Status: DC

## 2011-09-21 NOTE — Progress Notes (Deleted)
  Subjective:    Patient ID: [name], female    DOB: 1941/02/04, 70 y.o.   MRN: [id number]  HPI    Review of Systems     Objective:   Physical Exam        Assessment & Plan:

## 2011-09-21 NOTE — Progress Notes (Signed)
Subjective:   Patient ID: [name] female   DOB: [birth year] 70 y.o.   MRN: [id number]  HPI: [name] is a 70 y.o.  pleasant woman requests "to go back to Xanax." Reports feeling "awfull"  With Klonopin that gave her chest pains and inability to sleep due to nightmares. Patient denies any visual or auditory hallucinations; denies SI/HI or mania; denies being depressed. 2. C/o left ear ache and sore throat for 3 weeks with low grade fever and chills. Daughter had "a viral infection" 2 weeks ago.    Past Medical History  Diagnosis Date  . Depression   . Hypertension   . Glaucoma   . Anxiety   . Transient ischemic attack   . Hypothyroidism   . Glaucoma   . Hypothyroidism   . Right ankle sprain     hx of   Current Outpatient Prescriptions  Medication Sig Dispense Refill  . ALPRAZolam (XANAX) 0.5 MG tablet Take 1.5 tablets (0.75 mg total) by mouth 3 (three) times daily.  130 tablet  0  . aspirin (ASPIR-81) 81 MG EC tablet Take 81 mg by mouth daily.        . Calcium Carbonate-Vitamin D (CALCIUM 600+D) 600-200 MG-UNIT TABS Take 1 tablet by mouth 2 (two) times daily.        Marland Kitchen diltiazem (CARDIZEM) 60 MG tablet Take 1 tablet (60 mg total) by mouth 3 (three) times daily.  90 tablet  11  . levothyroxine (SYNTHROID, LEVOTHROID) 137 MCG tablet Take 1 tablet (137 mcg total) by mouth daily.  30 tablet  11  . lisinopril (PRINIVIL,ZESTRIL) 40 MG tablet Take 1 tablet (40 mg total) by mouth daily.  30 tablet  11  . RESTASIS 0.05 % ophthalmic emulsion       . azithromycin (ZITHROMAX) 250 MG tablet Take 2 tablets (500 mg) on  Day 1,  followed by 1 tablet (250 mg) once daily on Days 2 through 5.  6 each  0  . DISCONTD: levothyroxine (SYNTHROID, LEVOTHROID) 125 MCG tablet        Family History  Problem Relation Age of Onset  . Breast cancer Sister     in 2002  . Glaucoma      surgery for glaucoma and cataract  . Coronary artery disease Brother     Negative history of    History    Social History  . Marital Status: Divorced    Spouse Name: N/A    Number of Children: N/A  . Years of Education: N/A   Social History Main Topics  . Smoking status: Current Everyday Smoker -- 2.0 packs/day for 50 years    Types: Cigarettes  . Smokeless tobacco: None  . Alcohol Use: No  . Drug Use: Yes  . Sexually Active: None   Other Topics Concern  . None   Social History Narrative   Retired - previously an Production designer, theatre/television/film and supervisorLives with her son and his family which is stressfulCurrent smoker 1.5ppdFinancial assistance approved for 85% discount, after Medicare pays, for  [facility] only not eligible for [facility] card per [employer] August 18, 2010 11:02AM   Review of Systems: Constitutional: Denies fever, chills, diaphoresis, appetite change and fatigue.  HEENT: Denies photophobia, eye pain, redness, hearing loss, ear pain, congestion, sore throat, rhinorrhea, sneezing, mouth sores, trouble swallowing, neck pain, neck stiffness and tinnitus.   Respiratory: Denies SOB, DOE, cough, chest tightness,  and wheezing.   Cardiovascular: Denies chest pain, palpitations and leg swelling.  Gastrointestinal:  Denies nausea, vomiting, abdominal pain, diarrhea, constipation, blood in stool and abdominal distention.  Genitourinary: Denies dysuria, urgency, frequency, hematuria, flank pain and difficulty urinating.  Musculoskeletal: Denies myalgias, back pain, joint swelling, arthralgias and gait problem.  Skin: Denies pallor, rash and wound.  Neurological: Denies dizziness, seizures, syncope, weakness, light-headedness, numbness and headaches.  Hematological: Denies adenopathy. Easy bruising, personal or family bleeding history  Psychiatric/Behavioral: Denies suicidal ideation, mood changes, confusion, nervousness, sleep disturbance and agitation  Objective:  Physical Exam: Filed Vitals:   09/21/11 0959  BP: 136/79  Pulse: 72  Temp: 97.5 F (36.4 C)  Height: 5\' 7"  (1.702 m)  Weight:  147 lb (66.679 kg)   Constitutional: Vital signs reviewed.  Patient is a well-developed and well-nourished  in no acute distress and cooperative with exam. Alert and oriented x3.  Head: Normocephalic and atraumatic Ear: L TM retracted with dull light reflex and erythematous Mouth: no erythema or exudates, MMM Eyes: PERRL, EOMI, conjunctivae normal, No scleral icterus.  Neck: Supple, Trachea midline normal ROM, No JVD, mass, thyromegaly, or carotid bruit present.  Cardiovascular: RRR, S1 normal, S2 normal, no MRG, pulses symmetric and intact bilaterally Pulmonary/Chest: CTAB, no wheezes, rales, or rhonchi Abdominal: Soft. Non-tender, non-distended, bowel sounds are normal, no masses, organomegaly, or guarding present.  GU: no CVA tenderness Musculoskeletal: No joint deformities, erythema, or stiffness, ROM full and no nontender Hematology: no cervical, inginal, or axillary adenopathy.  Neurological: A&O x3, Strenght is normal and symmetric bilaterally, cranial nerve II-XII are grossly intact, no focal motor deficit, sensory intact to light touch bilaterally.  Skin: Warm, dry and intact. No rash, cyanosis, or clubbing.  Psychiatric: Normal mood and affect. Speech is tangential; nervous appearing. Judgment and thought content normal. Cognition and memory are normal.   Assessment & Plan:    1. L OM -Z-pack -avoid exposure to cold weather  2. Severe anxiety disorder -Will give one LAST refill on xanax -d/c Klonopin (side-effects: nightmares and CP) -Referral to a psychiatrist ASAP. Instructed that her anxiolytics will be prescribed by a behavioral health from now on. Patient verbalized udnerstanding. -instructed to call 911 and/or go to ED if feels worse.  3. HTN -controlled with current regimen -diet, exercise reviewed  4. Health maintenance -Flu shot today -mammogram referral

## 2011-09-21 NOTE — Progress Notes (Signed)
Referred by Dr. Denton Meek and Purnell Shoemaker to assist w/ securing Psychiatry appmt thru [facility].  Pt has long hx of depression and anxiety and family issues.  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   Appt secured w [facility] for Dec 4th at 1:15 PM.  All demographics, insurance and last three OV notes faxed to Lupita Leash at [facility]  [phone].

## 2011-09-21 NOTE — Patient Instructions (Signed)
Please, stop taking Klonopin Please, follow up with a Psychiatrist ASAP Please, have your mammogram done Call with any questions and follow up in 8-12 weeks.

## 2011-09-25 ENCOUNTER — Ambulatory Visit (HOSPITAL_COMMUNITY): Payer: Medicare Other

## 2011-09-25 LAB — VITAMIN D 1,25 DIHYDROXY
Vitamin D 1, 25 (OH)2 Total: 66 pg/mL (ref 18–72)
Vitamin D3 1, 25 (OH)2: 20 pg/mL

## 2011-09-27 ENCOUNTER — Ambulatory Visit (HOSPITAL_COMMUNITY): Payer: Medicare Other

## 2011-10-11 ENCOUNTER — Other Ambulatory Visit: Payer: Self-pay | Admitting: *Deleted

## 2011-10-11 NOTE — Telephone Encounter (Signed)
Pt called with concern about diltiazem Rx .  She has always taken once a day and the above Rx is for tid. Please let me know if this is an error. Pt is only taking once a day until we call her. Pt # [id number]

## 2011-10-11 NOTE — Telephone Encounter (Signed)
Pt had been on 24 hour cardiazem that would be only QD. 11/11 pt C/O dizziness so dose was decrease to 60 but it was incorrectly ordered as immediate release rather than 24hour extended release. The TID probably came about bc this was the first time was ordered in EPIC and EPIC defaulted in the TID dose. BP OK. Will have her cont to take the immediate release once a day and have Dr Fabio Bering address at when she returns.

## 2011-10-12 NOTE — Telephone Encounter (Signed)
Spoke w/ pt, she will continue to take 1 daily as she has always done, she repeated this back to me, appt is set for 11/30 at 1545, she is agreeable

## 2011-10-26 ENCOUNTER — Encounter: Payer: Medicare Other | Admitting: Internal Medicine

## 2011-10-26 ENCOUNTER — Telehealth: Payer: Self-pay | Admitting: *Deleted

## 2011-10-26 NOTE — Telephone Encounter (Signed)
Pt calls and states she never had an appt 11/30, did not change it to 12/18 and cannot come then, she states she will be having eye surg 12/17 and cannot drive in the pm due to her eye problems. She is finally given an appt for 12/4 at 1115 per doriss. She ends the call satisfied but was disgruntled at the start due to being referred to mental health and her appts.

## 2011-10-30 ENCOUNTER — Encounter: Payer: Self-pay | Admitting: Internal Medicine

## 2011-10-30 ENCOUNTER — Ambulatory Visit (INDEPENDENT_AMBULATORY_CARE_PROVIDER_SITE_OTHER): Payer: Medicare Other | Admitting: Internal Medicine

## 2011-10-30 ENCOUNTER — Ambulatory Visit (INDEPENDENT_AMBULATORY_CARE_PROVIDER_SITE_OTHER): Payer: Medicare Other | Admitting: Psychology

## 2011-10-30 ENCOUNTER — Other Ambulatory Visit: Payer: Self-pay | Admitting: *Deleted

## 2011-10-30 VITALS — BP 135/66 | HR 63 | Temp 96.8°F | Resp 20 | Ht 66.5 in | Wt 146.4 lb

## 2011-10-30 DIAGNOSIS — E039 Hypothyroidism, unspecified: Secondary | ICD-10-CM

## 2011-10-30 DIAGNOSIS — F411 Generalized anxiety disorder: Secondary | ICD-10-CM

## 2011-10-30 DIAGNOSIS — F419 Anxiety disorder, unspecified: Secondary | ICD-10-CM

## 2011-10-30 DIAGNOSIS — F17200 Nicotine dependence, unspecified, uncomplicated: Secondary | ICD-10-CM

## 2011-10-30 LAB — TSH: TSH: 0.259 u[IU]/mL — ABNORMAL LOW (ref 0.350–4.500)

## 2011-10-30 MED ORDER — ALPRAZOLAM 0.5 MG PO TABS: 0.7500 mg | ORAL_TABLET | Freq: Three times a day (TID) | ORAL | Status: DC

## 2011-10-30 NOTE — Assessment & Plan Note (Addendum)
Last TSH was low, at that time her thyroxine was reduced this was in August 2012, will recheck TSH today and make any further adjustments as needed.   Update 10/31/11 TSH remains low, will reduce thyroxine to daily from 137. Recheck TSH on next f/u

## 2011-10-30 NOTE — Assessment & Plan Note (Signed)
Will not provide a prescription and we'll defer further recommendations by behavioral health

## 2011-10-30 NOTE — Telephone Encounter (Signed)
Talked with Dr Denton Meek and pt can have refill until seen in January with psychiatrist.

## 2011-10-30 NOTE — Progress Notes (Signed)
  Subjective:    Patient ID: [name], female    DOB: 09-29-1941, 70 y.o.   MRN: [id number]  HPI  Patient is a 70 year old female with a past medical history listed below, presents here today complaining of anxiety, this has been ongoing for the past several years since her husband left her and her children, she is going to behavioral health today for this therefore I did not make any changes, in terms of her other medical problems she has been taking all her medications, denies any complaints and reports ongoing smoking and at this point she does not want to quit  Patient Active Problem List  Diagnoses  . HYPOTHYROIDISM  . VITAMIN D DEFICIENCY  . ANXIETY  . TOBACCO ABUSE  . DEPRESSION  . GLAUCOMA NOS  . HYPERTENSION  . CHOLELITHIASIS, ASYMPTOMATIC  . POSTMENOPAUSAL STATUS  . TRANSAMINASES, SERUM, ELEVATED  . ANKLE SPRAIN, LEFT  . TRANSIENT ISCHEMIC ATTACK, HX OF  . Urinary tract infection   Current Outpatient Prescriptions on File Prior to Visit  Medication Sig Dispense Refill  . ALPRAZolam (XANAX) 0.5 MG tablet Take 1.5 tablets (0.75 mg total) by mouth 3 (three) times daily.  130 tablet  0  . aspirin (ASPIR-81) 81 MG EC tablet Take 81 mg by mouth daily.        . Calcium Carbonate-Vitamin D (CALCIUM 600+D) 600-200 MG-UNIT TABS Take 1 tablet by mouth 2 (two) times daily.        Marland Kitchen diltiazem (CARDIZEM) 60 MG tablet Take 1 tablet (60 mg total) by mouth 3 (three) times daily.  90 tablet  11  . levothyroxine (SYNTHROID, LEVOTHROID) 137 MCG tablet Take 1 tablet (137 mcg total) by mouth daily.  30 tablet  11  . lisinopril (PRINIVIL,ZESTRIL) 40 MG tablet Take 1 tablet (40 mg total) by mouth daily.  30 tablet  11  . RESTASIS 0.05 % ophthalmic emulsion        Allergies  Allergen Reactions  . Lactose Intolerance (Gi)      Review of Systems  All other systems reviewed and are negative.       Objective:   Physical Exam  Nursing note and vitals reviewed. Constitutional: She is  oriented to person, place, and time. She appears well-developed and well-nourished.  HENT:  Head: Normocephalic and atraumatic.  Eyes: Pupils are equal, round, and reactive to light.  Neck: Normal range of motion. Neck supple. No JVD present. No thyromegaly present.  Cardiovascular: Normal rate, regular rhythm and normal heart sounds.   No murmur heard. Pulmonary/Chest: Effort normal and breath sounds normal. She has no wheezes. She has no rales.  Abdominal: Soft. Bowel sounds are normal.  Musculoskeletal: Normal range of motion. She exhibits no edema.  Neurological: She is alert and oriented to person, place, and time.  Skin: Skin is warm and dry.          Assessment & Plan:

## 2011-10-30 NOTE — Telephone Encounter (Signed)
Call from [facility] RN [phone] )with Behavioral Health @ [facility]. She is scheduling pt with psychiatrist, but first appointment is after the first of the year. Nurse is asking for refill until then.  Will need one rx with one refill.  Pt is out of meds nowl

## 2011-10-30 NOTE — Progress Notes (Signed)
Psychiatric Assessment Adult  Patient Identification:  [name] Date of Evaluation:  10/30/2011 Chief Complaint: My Dr. Dewayne Hatch me to stop taking Xanax that I have been on for years and is working well. History of Chief Complaint:   Chief Complaint  Patient presents with  . Anxiety  . Medication Refill    sent here by PCP for verification of need to continue Xanax    HPI Review of Systems Physical Exam  Depressive Symptoms: anxiety  (Hypo) Manic Symptoms:   Elevated Mood:  No Irritable Mood:  No Grandiosity:  No Distractibility:  No Labiality of Mood:  No Delusions:  No Hallucinations:  No Impulsivity:  No Sexually Inappropriate Behavior:  No Financial Extravagance:  No Flight of Ideas:  No  Anxiety Symptoms: Excessive Worry:  No Panic Symptoms:  No Agoraphobia:  No Obsessive Compulsive: No  Symptoms: None Specific Phobias:  No Social Anxiety:  No  Psychotic Symptoms:  Hallucinations: No None Delusions:  No Paranoia:  No   Ideas of Reference:  No  PTSD Symptoms: Ever had a traumatic exposure:  No Had a traumatic exposure in the last month:  No Re-experiencing: No None Hypervigilance:  No Hyperarousal: No None Avoidance: No None  Traumatic Brain Injury: No None  Past Psychiatric History: Diagnosis:Generalized Anxiety  Hospitalizations: none  Outpatient Care: PCP only  Substance Abuse Care: none  Self-Mutilation:none  Suicidal Attempts: none  Violent Behaviors: none   Past Medical History:   Past Medical History  Diagnosis Date  . Depression   . Hypertension   . Glaucoma   . Anxiety   . Transient ischemic attack   . Hypothyroidism   . Glaucoma   . Hypothyroidism   . Right ankle sprain     hx of   History of Loss of Consciousness:  No Seizure History:  No Cardiac History:  No Allergies:   Allergies  Allergen Reactions  . Lactose Intolerance (Gi)    Current Medications:  Current Outpatient Prescriptions  Medication Sig Dispense  Refill  . ALPRAZolam (XANAX) 0.5 MG tablet Take 1.5 tablets (0.75 mg total) by mouth 3 (three) times daily.  130 tablet  0  . aspirin (ASPIR-81) 81 MG EC tablet Take 81 mg by mouth daily.        . Calcium Carbonate-Vitamin D (CALCIUM 600+D) 600-200 MG-UNIT TABS Take 1 tablet by mouth 2 (two) times daily.        Marland Kitchen diltiazem (CARDIZEM) 60 MG tablet Take 1 tablet (60 mg total) by mouth 3 (three) times daily.  90 tablet  11  . levothyroxine (SYNTHROID, LEVOTHROID) 137 MCG tablet Take 1 tablet (137 mcg total) by mouth daily.  30 tablet  11  . lisinopril (PRINIVIL,ZESTRIL) 40 MG tablet Take 1 tablet (40 mg total) by mouth daily.  30 tablet  11  . RESTASIS 0.05 % ophthalmic emulsion         Previous Psychotropic Medications:  Medication Dose  Paxil Unknown  Took for 2 years ended 2004  Xanax 2mg   TID starting in 2002 Decreased dose at her request due to drowsiness in 2005 Continues to take up to 0.75 mg TID to date.    Klonopin Given unknown dose and Xanax abruptly discontinued by PCP in Oct. 2012  Had withdrawal symptoms and was restarted on Xanax to await [facility] appt.               Substance Abuse History in the last 12 months: Substance Age of 1st Use Last Use  Amount Specific Type  Nicotine  unknown today 1 1/2 ppd cigarettes  Alcohol  46  few days 3-4 /week beer  Cannabis      Opiates      Cocaine      Methamphetamines      LSD      Ecstasy      Benzodiazepines  60  today 2.25/mg/day  xanax  Caffeine unknown  today  2-3 cups  coffee  Inhalants      Others:                          Medical Consequences of Substance Abuse: physiological addiction Legal Consequences of Substance Abuse: none  Family Consequences of Substance Abuse: none  Blackouts:  No DT's:  No Withdrawal Symptoms:  Yes Cramps Diaphoresis Diarrhea Nausea Tremors   Social History: Current Place of Residence: With daughter and family in [place], [place] Place of Birth:[place], [place] Family Members: Both  parents deceased; older brother died at 34 years related to substance abuse; one younger sister and one brother living; Has 3 children:  Daughter 79, daughter, 43 , son 71. 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 Breast cancer Sister     in 2002  . Glaucoma      surgery for glaucoma and cataract  . Coronary artery disease Brother     Negative history of     Mental Status Examination/Evaluation: Objective:  Appearance: Well Groomed  Eye Contact::  Good  Speech:  Clear and Coherent  Volume:  Normal  Mood:  "stressed" but denies depressed  Affect:  Congruent  Thought Process:  Logical  Orientation:  Full  Thought Content:  Good historian, direct and to the point  Suicidal Thoughts:  No  Homicidal Thoughts:  No  Judgement:  Intact  Insight:  Good  Psychomotor Activity:  Normal  Akathisia:  No  Handed:  Right  AIMS (if indicated):  NA  Assets:  Communication Skills Desire for Improvement Housing Physical Health Social Support Talents/Skills Transportation    Laboratory/X-Ray Psychological Evaluation(s)   NA NA   Assessment:  70 year old caucasian female who was referred to psychiatry for evaluation of need and desirability of continuing Xanax for anxiety.  She was completely clear and appropriate in her history and denied any abuse of the Xanax which she has taken consistently for the past 7 years.  Prior to that she had been prescribed Atarax for daily anxiety symptoms, which consist of grinding her teeth and a fluttery sensation in her gut.  She acknowledges the past year has  been stressful due to the presence of a step-grandson in the home where she lives her daughter. Other history and assessment documented above.   AXIS I Generalized Anxiety Disorder  AXIS II Deferred  AXIS III Past Medical History  Diagnosis Date  . Depression   . Hypertension   . Glaucoma   . Anxiety   . Transient ischemic attack   . Hypothyroidism   . Glaucoma   . Hypothyroidism   . Right ankle sprain     hx of     AXIS IV problems with primary support group  AXIS  V 61-70 mild symptoms   Treatment Plan/Recommendations:  Plan of Care:   Refer to psychiatric medical provider for further care.  She will have to wait for several months for an appointment with the psychiatrist or PA, therefore, called her PCP clinic and spoke to the nurse to continue her prescriptions until our provider has seen her.  Will not be until Feb. 2012.   Laboratory:  NA  Psychotherapy: not at this time, she prefers to see the psychiatrist first  Medications: See above note  Routine PRN Medications:  No  Consultations: none  Safety Concerns: NO  Other:      Rhoda Waldvogel, RN 12/4/20123:17 PM

## 2011-10-30 NOTE — Telephone Encounter (Signed)
Per, Lupita Leash Tessiatore's note, the patient has an appointment at the [facility] today, 10/30/11 at 1:15 pm for reevaluation of the patient's depression and anxiety management. Will no longer Rx Alprazolam in our clinic. Thank you.

## 2011-10-30 NOTE — Patient Instructions (Signed)
Take your medications as instructed.

## 2011-10-30 NOTE — Assessment & Plan Note (Signed)
Patient was counseled on smoking cessation strategies including medications and behavior modification options. Patient said she was not ready to stop smoking at this time.    

## 2011-10-31 ENCOUNTER — Other Ambulatory Visit: Payer: Self-pay | Admitting: *Deleted

## 2011-10-31 ENCOUNTER — Telehealth: Payer: Self-pay | Admitting: Licensed Clinical Social Worker

## 2011-10-31 MED ORDER — LEVOTHYROXINE SODIUM 100 MCG PO TABS: 100.0000 ug | ORAL_TABLET | Freq: Every day | ORAL | Status: DC

## 2011-10-31 NOTE — Progress Notes (Signed)
Addended by: Darnelle Maffucci on: 10/31/2011 09:15 AM   Modules accepted: Orders, Medications

## 2011-10-31 NOTE — Telephone Encounter (Signed)
Patient called and said she had intake only at [facility] yesterday and psychiatrist is unable to see patient until Feb 6th for medication review.  So they were unable to refill her Xanax.  She is requesting RX for Xanax and refill until Feb 6th.  [facility] is her pharmacy.

## 2011-11-01 NOTE — Telephone Encounter (Signed)
Xanax was refilled yesterday as of 10/31/2011. Ms. [name] personally spoke with the pharmacist. Thank you.

## 2011-11-01 NOTE — Telephone Encounter (Signed)
Called to pharm 12/5

## 2011-11-02 NOTE — Telephone Encounter (Signed)
I called the patient's home phone number and left a voice mail confirming Rf for Xanax. Thank you.

## 2011-11-13 ENCOUNTER — Encounter: Payer: Medicare Other | Admitting: Internal Medicine

## 2012-01-02 ENCOUNTER — Ambulatory Visit (INDEPENDENT_AMBULATORY_CARE_PROVIDER_SITE_OTHER): Payer: Medicare Other | Admitting: Psychiatry

## 2012-01-02 ENCOUNTER — Encounter (HOSPITAL_COMMUNITY): Payer: Self-pay | Admitting: Psychiatry

## 2012-01-02 DIAGNOSIS — F419 Anxiety disorder, unspecified: Secondary | ICD-10-CM

## 2012-01-02 DIAGNOSIS — F411 Generalized anxiety disorder: Secondary | ICD-10-CM

## 2012-01-02 MED ORDER — ALPRAZOLAM 0.5 MG PO TABS: 0.5000 mg | ORAL_TABLET | Freq: Two times a day (BID) | ORAL | Status: DC | PRN

## 2012-01-02 MED ORDER — AMITRIPTYLINE HCL 25 MG PO TABS: 25.0000 mg | ORAL_TABLET | Freq: Every day | ORAL | Status: DC
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
## 2012-01-21 ENCOUNTER — Encounter (HOSPITAL_COMMUNITY): Payer: Self-pay | Admitting: Psychiatry

## 2012-01-21 ENCOUNTER — Ambulatory Visit (INDEPENDENT_AMBULATORY_CARE_PROVIDER_SITE_OTHER): Payer: Medicare Other | Admitting: Psychiatry

## 2012-01-21 DIAGNOSIS — F411 Generalized anxiety disorder: Secondary | ICD-10-CM

## 2012-01-21 DIAGNOSIS — F419 Anxiety disorder, unspecified: Secondary | ICD-10-CM

## 2012-01-21 MED ORDER — ALPRAZOLAM 0.5 MG PO TABS: 0.5000 mg | ORAL_TABLET | Freq: Two times a day (BID) | ORAL | Status: DC | PRN

## 2012-01-21 MED ORDER — AMITRIPTYLINE HCL 10 MG PO TABS: ORAL_TABLET | ORAL | Status: DC
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
## 2012-01-25 ENCOUNTER — Other Ambulatory Visit (HOSPITAL_COMMUNITY): Payer: Self-pay | Admitting: Psychiatry

## 2012-02-08 ENCOUNTER — Ambulatory Visit (INDEPENDENT_AMBULATORY_CARE_PROVIDER_SITE_OTHER): Payer: Medicare Other | Admitting: Psychology

## 2012-02-08 DIAGNOSIS — F411 Generalized anxiety disorder: Secondary | ICD-10-CM

## 2012-02-08 NOTE — Progress Notes (Signed)
   THERAPIST PROGRESS NOTE  Session Time: 1305 - 1355   Participation Level: Active  Behavioral Response: CasualAlertAnxious  Type of Therapy: Individual Therapy  Treatment Goals addressed: Coping  Interventions: Psychosocial Skills: assertiveness and Reframing  Summary: [name] is a 71 y.o. female who presents with feeling misunderstood and having difficulty sleeping.  Family situation remains stressful, with patient attempting to "keep the peace" and not complain about anything herself.  States that she has not been able to tolerate the Elavil, even if takes only 1/2 prescribed dose, because "I sleep half of the next day every time I take it."    Today she is visibly tense, with some jaw clenching and arms held tightly folded.  Affect is stressed and mood anxious.  Thought processes are coherent and logical.  She has understanding of the reason Dr.s are hesitant to prescribe Xanax in large amounts, but she continues to reason that she has never had to increase her effective dose and doesn't always take all that is allotted per day.  She brought her pill bottle and we counted 35 out of 60 pills remain,15 days after prescription filled.    We discussed the home situation and her hopes to be able to move to an apartment of her own by next year.  Living with her daughter and the son-in-law (who is 72, 20 years older than daughter) and his son (68) and the teenage daughter of the son on weekends, and another granddaughter who attends college, does get on her nerves.  "My daughter does things differently than I do."  She has just decided to go to a local family practice that has now become connected with [place].  She is going to request that they take over prescribing all her medications.  Suicidal/Homicidal: Nowithout intent/plan  Therapist Response: Helped her problem solve her appointments so that she does not run out of medications. Recognized  her dependence and distinguished that from addiction.  Focused her on the situational triggers of anxiety and talked about the strengths she has demonstrated in the past.  Plan: Return again in 4 weeks.  States she would like to continue to see therapist in the future.  We will try to maintain our focus on the coping skills she uses to deal with family and financial stressors.  Diagnosis: Axis I: Generalized Anxiety Disorder    Axis II: No diagnosis    Breindy Meadow, RN 02/08/2012

## 2012-02-16 ENCOUNTER — Other Ambulatory Visit (HOSPITAL_COMMUNITY): Payer: Self-pay | Admitting: Psychiatry

## 2012-02-18 ENCOUNTER — Ambulatory Visit (INDEPENDENT_AMBULATORY_CARE_PROVIDER_SITE_OTHER): Payer: Medicare Other | Admitting: Psychiatry

## 2012-02-18 ENCOUNTER — Encounter (HOSPITAL_COMMUNITY): Payer: Self-pay | Admitting: Psychiatry

## 2012-02-18 VITALS — BP 118/74 | HR 80 | Wt 145.2 lb

## 2012-02-18 DIAGNOSIS — F419 Anxiety disorder, unspecified: Secondary | ICD-10-CM

## 2012-02-18 DIAGNOSIS — F411 Generalized anxiety disorder: Secondary | ICD-10-CM

## 2012-02-18 MED ORDER — AMITRIPTYLINE HCL 10 MG PO TABS: 10.0000 mg | ORAL_TABLET | Freq: Every day | ORAL | Status: DC

## 2012-02-18 MED ORDER — ALPRAZOLAM 0.5 MG PO TABS: 0.5000 mg | ORAL_TABLET | Freq: Two times a day (BID) | ORAL | Status: DC | PRN
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
## 2012-03-07 ENCOUNTER — Other Ambulatory Visit: Payer: Self-pay | Admitting: Family Medicine

## 2012-03-07 ENCOUNTER — Ambulatory Visit (HOSPITAL_COMMUNITY): Payer: Self-pay | Admitting: Psychology

## 2012-03-07 DIAGNOSIS — Z139 Encounter for screening, unspecified: Secondary | ICD-10-CM

## 2012-03-13 ENCOUNTER — Ambulatory Visit (HOSPITAL_COMMUNITY): Payer: Medicare Other

## 2012-03-21 ENCOUNTER — Other Ambulatory Visit: Payer: Self-pay | Admitting: Internal Medicine

## 2012-03-31 ENCOUNTER — Ambulatory Visit (HOSPITAL_COMMUNITY): Payer: Self-pay | Admitting: Psychiatry

## 2012-04-12 ENCOUNTER — Other Ambulatory Visit: Payer: Self-pay | Admitting: Internal Medicine

## 2013-02-12 ENCOUNTER — Ambulatory Visit (INDEPENDENT_AMBULATORY_CARE_PROVIDER_SITE_OTHER): Payer: Medicare Other | Admitting: Physician Assistant

## 2013-02-12 ENCOUNTER — Encounter: Payer: Self-pay | Admitting: Physician Assistant

## 2013-02-12 VITALS — BP 158/68 | HR 67 | Temp 97.2°F | Ht 67.0 in | Wt 152.6 lb

## 2013-02-12 DIAGNOSIS — F411 Generalized anxiety disorder: Secondary | ICD-10-CM

## 2013-02-12 DIAGNOSIS — E039 Hypothyroidism, unspecified: Secondary | ICD-10-CM

## 2013-02-12 DIAGNOSIS — J329 Chronic sinusitis, unspecified: Secondary | ICD-10-CM

## 2013-02-12 DIAGNOSIS — F419 Anxiety disorder, unspecified: Secondary | ICD-10-CM

## 2013-02-12 LAB — CBC WITH DIFFERENTIAL/PLATELET
Basophils Absolute: 0 10*3/uL (ref 0.0–0.1)
Lymphocytes Relative: 38 % (ref 12–46)
Lymphs Abs: 2.5 10*3/uL (ref 0.7–4.0)
Neutro Abs: 3.6 10*3/uL (ref 1.7–7.7)
Neutrophils Relative %: 54 % (ref 43–77)
Platelets: 225 10*3/uL (ref 150–400)
RBC: 4.53 MIL/uL (ref 3.87–5.11)
RDW: 13.6 % (ref 11.5–15.5)
WBC: 6.6 10*3/uL (ref 4.0–10.5)

## 2013-02-12 LAB — COMPREHENSIVE METABOLIC PANEL
ALT: 8 U/L (ref 0–35)
AST: 13 U/L (ref 0–37)
Albumin: 4.4 g/dL (ref 3.5–5.2)
CO2: 29 mEq/L (ref 19–32)
Calcium: 9.9 mg/dL (ref 8.4–10.5)
Chloride: 103 mEq/L (ref 96–112)
Creat: 0.84 mg/dL (ref 0.50–1.10)
Potassium: 4.6 mEq/L (ref 3.5–5.3)
Sodium: 140 mEq/L (ref 135–145)
Total Protein: 6.8 g/dL (ref 6.0–8.3)

## 2013-02-12 LAB — TSH: TSH: 6.613 u[IU]/mL — ABNORMAL HIGH (ref 0.350–4.500)

## 2013-02-12 MED ORDER — FLUTICASONE PROPIONATE 50 MCG/ACT NA SUSP: 2.0000 | Freq: Every day | NASAL | Status: DC

## 2013-02-12 MED ORDER — ALPRAZOLAM 1 MG PO TABS: 1.0000 mg | ORAL_TABLET | Freq: Three times a day (TID) | ORAL | Status: DC | PRN

## 2013-02-12 NOTE — Progress Notes (Signed)
  Subjective:    Patient ID: [name], female    DOB: 15-May-1941, 72 y.o.   MRN: [id number]  HPI scheduled appt. To recheck anxiety, HTN, hypothyroid    Review of Systems  HENT: Positive for dental problem.        Peridontitis - scheduled to have all teeth removed next week  All other systems reviewed and are negative.       Objective:   Physical Exam  Vitals reviewed. Constitutional: She is oriented to person, place, and time.  HENT:  Head: Normocephalic and atraumatic.  Right Ear: External ear normal.  Left Ear: External ear normal.  Nose: Nose normal.  Mouth/Throat: Oropharynx is clear and moist.  Nasal hypertrophy b/l; TMs retracted R>L  Eyes: Conjunctivae and EOM are normal.  Neck: Normal range of motion. Neck supple.  Cardiovascular: Normal rate, regular rhythm and normal heart sounds.   Pulmonary/Chest: Effort normal and breath sounds normal.  Abdominal: Soft. Bowel sounds are normal.  Musculoskeletal: Normal range of motion.  Neurological: She is alert and oriented to person, place, and time.  Skin: Skin is warm and dry.  Psychiatric: She has a normal mood and affect. Her behavior is normal. Judgment and thought content normal.          Assessment & Plan:  Anxiety Hypothyroidism Sinusitis Peridontitis  Orders Placed This Encounter  Procedures  . TSH  . CBC with Differential  . Comprehensive metabolic panel   Meds ordered this encounter  Medications  . ALPRAZolam (XANAX) 1 MG tablet    Sig: Take 1 tablet (1 mg total) by mouth 3 (three) times daily as needed for sleep.    Dispense:  90 tablet    Refill:  2    Order Specific Question:  Supervising Provider    Answer:  Ernestina Penna [phone]

## 2013-02-17 ENCOUNTER — Other Ambulatory Visit: Payer: Self-pay | Admitting: Physician Assistant

## 2013-02-17 DIAGNOSIS — E039 Hypothyroidism, unspecified: Secondary | ICD-10-CM

## 2013-02-17 MED ORDER — LEVOTHYROXINE SODIUM 100 MCG PO TABS: 100.0000 ug | ORAL_TABLET | Freq: Every day | ORAL | Status: DC

## 2013-02-18 ENCOUNTER — Other Ambulatory Visit: Payer: Self-pay | Admitting: *Deleted

## 2013-02-18 DIAGNOSIS — E039 Hypothyroidism, unspecified: Secondary | ICD-10-CM

## 2013-02-18 MED ORDER — LEVOTHYROXINE SODIUM 112 MCG PO TABS: 112.0000 ug | ORAL_TABLET | Freq: Every day | ORAL | Status: DC

## 2013-02-18 NOTE — Telephone Encounter (Signed)
Discussed labs with patient and she questioned if we would increase her dose of Synthroid since her TSH was a little elevated.  Discussed with Helene Kelp, PA-C and authorized new script for levothyroxine .  Patient aware.

## 2013-03-05 ENCOUNTER — Telehealth: Payer: Self-pay | Admitting: Nurse Practitioner

## 2013-03-05 NOTE — Telephone Encounter (Signed)
bp not staying stable- on meds: lisinopril 40, diltiazem 180. htn for years. Usually around 120-70- NOW running like: 149/51, 151/60 are examples. Told to keep a log of bid bp and pulse readings and bring in to appt 4/17 with mmm.

## 2013-03-12 ENCOUNTER — Ambulatory Visit: Payer: Self-pay | Admitting: Nurse Practitioner

## 2013-03-20 ENCOUNTER — Other Ambulatory Visit: Payer: Self-pay | Admitting: Physician Assistant

## 2013-04-28 ENCOUNTER — Encounter (HOSPITAL_COMMUNITY): Payer: Self-pay | Admitting: Emergency Medicine

## 2013-04-28 DIAGNOSIS — E039 Hypothyroidism, unspecified: Secondary | ICD-10-CM | POA: Insufficient documentation

## 2013-04-28 DIAGNOSIS — Y9289 Other specified places as the place of occurrence of the external cause: Secondary | ICD-10-CM | POA: Insufficient documentation

## 2013-04-28 DIAGNOSIS — Z8673 Personal history of transient ischemic attack (TIA), and cerebral infarction without residual deficits: Secondary | ICD-10-CM | POA: Insufficient documentation

## 2013-04-28 DIAGNOSIS — Z79899 Other long term (current) drug therapy: Secondary | ICD-10-CM | POA: Insufficient documentation

## 2013-04-28 DIAGNOSIS — W57XXXA Bitten or stung by nonvenomous insect and other nonvenomous arthropods, initial encounter: Secondary | ICD-10-CM | POA: Insufficient documentation

## 2013-04-28 DIAGNOSIS — S90569A Insect bite (nonvenomous), unspecified ankle, initial encounter: Secondary | ICD-10-CM | POA: Insufficient documentation

## 2013-04-28 DIAGNOSIS — Z7982 Long term (current) use of aspirin: Secondary | ICD-10-CM | POA: Insufficient documentation

## 2013-04-28 DIAGNOSIS — Z8669 Personal history of other diseases of the nervous system and sense organs: Secondary | ICD-10-CM | POA: Insufficient documentation

## 2013-04-28 DIAGNOSIS — I10 Essential (primary) hypertension: Secondary | ICD-10-CM | POA: Insufficient documentation

## 2013-04-28 DIAGNOSIS — Z87828 Personal history of other (healed) physical injury and trauma: Secondary | ICD-10-CM | POA: Insufficient documentation

## 2013-04-28 DIAGNOSIS — Y9389 Activity, other specified: Secondary | ICD-10-CM | POA: Insufficient documentation

## 2013-04-28 DIAGNOSIS — S30860A Insect bite (nonvenomous) of lower back and pelvis, initial encounter: Secondary | ICD-10-CM | POA: Insufficient documentation

## 2013-04-28 DIAGNOSIS — F411 Generalized anxiety disorder: Secondary | ICD-10-CM | POA: Insufficient documentation

## 2013-04-28 DIAGNOSIS — IMO0002 Reserved for concepts with insufficient information to code with codable children: Secondary | ICD-10-CM | POA: Insufficient documentation

## 2013-04-28 DIAGNOSIS — F3289 Other specified depressive episodes: Secondary | ICD-10-CM | POA: Insufficient documentation

## 2013-04-28 DIAGNOSIS — F329 Major depressive disorder, single episode, unspecified: Secondary | ICD-10-CM | POA: Insufficient documentation

## 2013-04-28 DIAGNOSIS — F17200 Nicotine dependence, unspecified, uncomplicated: Secondary | ICD-10-CM | POA: Insufficient documentation

## 2013-04-28 NOTE — ED Notes (Signed)
Patient c/o being bitten by something on Sunday on bilateral legs, ankles and lower abdomen.  Patient c/o increased itching.

## 2013-04-29 ENCOUNTER — Emergency Department (HOSPITAL_COMMUNITY)
Admission: EM | Admit: 2013-04-29 | Discharge: 2013-04-29 | Disposition: A | Discharge status: Home / Self Care | Payer: Medicare Other | Attending: Emergency Medicine | Admitting: Emergency Medicine

## 2013-04-29 DIAGNOSIS — S80862A Insect bite (nonvenomous), left lower leg, initial encounter: Secondary | ICD-10-CM

## 2013-04-29 DIAGNOSIS — S30861A Insect bite (nonvenomous) of abdominal wall, initial encounter: Secondary | ICD-10-CM

## 2013-04-29 MED ORDER — LIDOCAINE HCL 2 % EX GEL
Freq: Once | CUTANEOUS | Status: AC
  Administered 2013-04-29: 10 via TOPICAL
  Filled 2013-04-29: qty 10

## 2013-04-29 NOTE — ED Provider Notes (Signed)
History     CSN: [id number]  Arrival date & time 04/28/13  2327   First MD Initiated Contact with Patient 04/29/13 0138      Chief Complaint  Patient presents with  . Insect Bite    HPI patient developed the multiple what she thinks are insect bites on her lower extremities and one on her abdomen on Sunday while walking through all grass. She denies any tick exposure and has not removed any insects from the wounds. She denies any rash, fevers, chills, abdominal pain, nausea, vomiting, chest pain or shortness of breath. Patient has a slight headache today.   Past Medical History  Diagnosis Date  . Depression   . Hypertension   . Glaucoma   . Anxiety   . Transient ischemic attack   . Hypothyroidism   . Glaucoma   . Hypothyroidism   . Right ankle sprain     hx of    Past Surgical History  Procedure Laterality Date  . Carotid endarterectomy  10/2001    Left, by Dr. Orson Slick.  . Laparoscopic cholecystectomy w/ cholangiography  03/2010    With liver biopsy. Dr. Carolynne Edouard.  . Left brest biopsy other  2002    Benign  . Surgery for glaucoma, cataract, other      Family History  Problem Relation Age of Onset  . Breast cancer Sister     in 2002  . Glaucoma      surgery for glaucoma and cataract  . Coronary artery disease Brother     Negative history of   . Drug abuse Brother   . Hypertension Mother   . Hypothyroidism Mother   . Hypertension Father     History  Substance Use Topics  . Smoking status: Current Every Day Smoker -- 2.00 packs/day for 50 years    Types: Cigarettes  . Smokeless tobacco: Not on file  . Alcohol Use: No    OB History   Grav Para Term Preterm Abortions TAB SAB Ect Mult Living                  Review of Systems At least 10pt or greater review of systems completed and are negative except where specified in the HPI.  Allergies  Lactose intolerance (gi)  Home Medications   Current Outpatient Rx  Name  Route  Sig  Dispense  Refill  .  ALPRAZolam (XANAX) 1 MG tablet   Oral   Take 1 tablet (1 mg total) by mouth 3 (three) times daily as needed for sleep.   90 tablet   2   . aspirin (ASPIR-81) 81 MG EC tablet   Oral   Take 81 mg by mouth daily.           Marland Kitchen CARDIZEM 60 MG tablet      TAKE (1) TABLET BY MOUTH ONCE DAILY.   30 each   1   . FLOVENT HFA 44 MCG/ACT inhaler      INHALE 1 PUFF INTO THE LUNGS TWICE A DAY.   1 Inhaler   11   . levothyroxine (SYNTHROID) 112 MCG tablet   Oral   Take 1 tablet (112 mcg total) by mouth daily.   30 tablet   3   . lisinopril (PRINIVIL,ZESTRIL) 40 MG tablet      TAKE (1) TABLET BY MOUTH ONCE DAILY.   30 tablet   1   . RESTASIS 0.05 % ophthalmic emulsion               .  amitriptyline (ELAVIL) 10 MG tablet   Oral   Take 1 tablet (10 mg total) by mouth at bedtime.   30 tablet   0   . Calcium Carbonate-Vitamin D (CALCIUM 600+D) 600-200 MG-UNIT TABS   Oral   Take 1 tablet by mouth 2 (two) times daily.           . fluticasone (FLONASE) 50 MCG/ACT nasal spray   Nasal   Place 2 sprays into the nose daily.   16 g   6     BP 144/70  Pulse 66  Temp(Src) 97.9 F (36.6 C) (Oral)  Resp 18  Ht 5\' 7"  (1.702 m)  Wt 148 lb (67.132 kg)  BMI 23.17 kg/m2  SpO2 98%  Physical Exam  Skin:       Nursing notes reviewed.  Electronic medical record reviewed. VITAL SIGNS:   Filed Vitals:   04/28/13 2336 04/29/13 0144  BP: 187/77 144/70  Pulse: 70 66  Temp: 97.9 F (36.6 C)   TempSrc: Oral   Resp: 18 18  Height: 5\' 7"  (1.702 m)   Weight: 148 lb (67.132 kg)   SpO2: 99% 98%   CONSTITUTIONAL: Awake, oriented, appears non-toxic HENT: Atraumatic, normocephalic, oral mucosa pink and moist, airway patent. Nares patent without drainage. External ears normal. EYES: Conjunctiva clear, EOMI, PERRLA NECK: Trachea midline, non-tender, supple CARDIOVASCULAR: Normal heart rate, Normal rhythm, No murmurs, rubs, gallops PULMONARY/CHEST: Clear to auscultation, no  rhonchi, wheezes, or rales. Symmetrical breath sounds. Non-tender. ABDOMINAL: Non-distended, soft, non-tender - no rebound or guarding.  BS normal. NEUROLOGIC: Non-focal, moving all four extremities, no gross sensory or motor deficits. EXTREMITIES: No clubbing, cyanosis, or edema. Multiple arthropod bites with areas of pink skin, no excoriations. No ticks. SKIN: Warm, Dry, No erythema, No rash  ED Course  Procedures (including critical care time)  Labs Reviewed - No data to display No results found.   1. Arthropod bite of multiple sites of left lower extremity   2. Arthropod bite of abdominal wall, initial encounter       MDM  [name] is a 72 y.o. female presenting with multiple arthropod bites. No evidence for tick bite, no suggestion of [facility] spotted fever. Patient is afebrile, no rash, she does have a mild headache occasionally gets those. Do not think labs or imaging is indicated at this time. We'll treat the patient symptomatically with topical lidocaine jelly to prevent excoriation, and wound infection. She does not require antibiotics at this time. Followup with primary care as needed. Return to the ER for worsening symptoms such as headaches, rash, infection.         Jones Skene, MD 04/29/13 8119

## 2013-05-02 ENCOUNTER — Other Ambulatory Visit: Payer: Self-pay | Admitting: Physician Assistant

## 2013-05-04 ENCOUNTER — Ambulatory Visit (INDEPENDENT_AMBULATORY_CARE_PROVIDER_SITE_OTHER): Payer: Medicare Other | Admitting: Family Medicine

## 2013-05-04 ENCOUNTER — Encounter: Payer: Self-pay | Admitting: Family Medicine

## 2013-05-04 ENCOUNTER — Telehealth: Payer: Self-pay | Admitting: Nurse Practitioner

## 2013-05-04 VITALS — BP 148/72 | HR 89 | Temp 97.1°F | Wt 146.6 lb

## 2013-05-04 DIAGNOSIS — T148 Other injury of unspecified body region: Secondary | ICD-10-CM

## 2013-05-04 DIAGNOSIS — L0390 Cellulitis, unspecified: Secondary | ICD-10-CM

## 2013-05-04 DIAGNOSIS — L0291 Cutaneous abscess, unspecified: Secondary | ICD-10-CM

## 2013-05-04 DIAGNOSIS — W57XXXA Bitten or stung by nonvenomous insect and other nonvenomous arthropods, initial encounter: Secondary | ICD-10-CM

## 2013-05-04 MED ORDER — MUPIROCIN 2 % EX OINT: TOPICAL_OINTMENT | Freq: Three times a day (TID) | CUTANEOUS | Status: DC

## 2013-05-04 NOTE — Progress Notes (Signed)
Patient ID: [name], female   DOB: [birth year], 72 y.o.   MRN: [id number] SUBJECTIVE: CC Multiple insect bites of the legs.   HPI:  Evaluated and treated in the ED because she was conncerned about ticks. No fever, no rashes and did not find any ticks. Was outdoors.  PMH/PSH: reviewed/updated in Epic  SH/FH: reviewed/updated in Epic  Allergies: reviewed/updated in Epic  Medications: reviewed/updated in Epic  Immunizations: reviewed/updated in Epic  ROS: As above in the HPI. All other systems are stable or negative.  OBJECTIVE: APPEARANCE:  Patient in no acute distress.The patient appeared well nourished and normally developed. Acyanotic. Waist: VITAL SIGNS:BP 148/72  Pulse 89  Temp(Src) 97.1 F (36.2 C)  Wt 146 lb 9.6 oz (66.497 kg)  BMI 22.96 kg/m2 WF  SKIN: warm and  Dry without tattoos and scars There are deroofed papules on the legs. One on the right lateral abdominal wall has a black center and 1 cm red cellulitic area around it. The rest are scabbed and deroofed from patient picking.   HEAD and Neck: without JVD, Head and scalp: normal Eyes:No scleral icterus. Fundi normal, eye movements normal. Ears: Auricle normal, canal normal, Tympanic membranes normal, insufflation normal. Nose: normal Throat: normal Neck & thyroid: normal  CHEST & LUNGS: Chest wall: normal Lungs: Clear  CVS: Reveals the PMI to be normally located. Regular rhythm, First and Second Heart sounds are normal,  absence of murmurs, rubs or gallops. Peripheral vasculature: Radial pulses: normal Dorsal pedis pulses: normal Posterior pulses: normal  ABDOMEN:  Appearance: normal Benign, no organomegaly, no masses, no Abdominal Aortic enlargement. No Guarding , no rebound. No Bruits. Bowel sounds: normal  RECTAL: N/A GU: N/A  EXTREMETIES: nonedematous. Both Femoral and Pedal pulses are normal.  MUSCULOSKELETAL:  Spine: normal Joints: intact  NEUROLOGIC: oriented to  time,place and person; nonfocal. Strength is normal Sensory is normal Reflexes are normal Cranial Nerves are normal.  ASSESSMENT: Cellulitis - Plan: mupirocin ointment (BACTROBAN) 2 %  Insect bites - Plan: mupirocin ointment (BACTROBAN) 2 % doubt it is ticks but certainly multiple insect bites with one with a little cellulitis. PLAN: No orders of the defined types were placed in this encounter.   One lesion was biopsied and  Analysed under the microscope and no insects  Seen.  Meds ordered this encounter  Medications  . mupirocin ointment (BACTROBAN) 2 %    Sig: Apply topically 3 (three) times daily. For 7 days    Dispense:  22 g    Refill:  0   Return if symptoms worsen or fail to improve.   [name], M.D.

## 2013-05-04 NOTE — Patient Instructions (Signed)
Smoking Cessation Quitting smoking is important to your health and has many advantages. However, it is not always easy to quit since nicotine is a very addictive drug. Often times, people try 3 times or more before being able to quit. This document explains the best ways for you to prepare to quit smoking. Quitting takes hard work and a lot of effort, but you can do it. ADVANTAGES OF QUITTING SMOKING  You will live longer, feel better, and live better.  Your body will feel the impact of quitting smoking almost immediately.  Within 20 minutes, blood pressure decreases. Your pulse returns to its normal level.  After 8 hours, carbon monoxide levels in the blood return to normal. Your oxygen level increases.  After 24 hours, the chance of having a heart attack starts to decrease. Your breath, hair, and body stop smelling like smoke.  After 48 hours, damaged nerve endings begin to recover. Your sense of taste and smell improve.  After 72 hours, the body is virtually free of nicotine. Your bronchial tubes relax and breathing becomes easier.  After 2 to 12 weeks, lungs can hold more air. Exercise becomes easier and circulation improves.  The risk of having a heart attack, stroke, cancer, or lung disease is greatly reduced.  After 1 year, the risk of coronary heart disease is cut in half.  After 5 years, the risk of stroke falls to the same as a nonsmoker.  After 10 years, the risk of lung cancer is cut in half and the risk of other cancers decreases significantly.  After 15 years, the risk of coronary heart disease drops, usually to the level of a nonsmoker.  If you are pregnant, quitting smoking will improve your chances of having a healthy baby.  The people you live with, especially any children, will be healthier.  You will have extra money to spend on things other than cigarettes. QUESTIONS TO THINK ABOUT BEFORE ATTEMPTING TO QUIT You may want to talk about your answers with your  caregiver.  Why do you want to quit?  If you tried to quit in the past, what helped and what did not?  What will be the most difficult situations for you after you quit? How will you plan to handle them?  Who can help you through the tough times? Your family? Friends? A caregiver?  What pleasures do you get from smoking? What ways can you still get pleasure if you quit? Here are some questions to ask your caregiver:  How can you help me to be successful at quitting?  What medicine do you think would be best for me and how should I take it?  What should I do if I need more help?  What is smoking withdrawal like? How can I get information on withdrawal? GET READY  Set a quit date.  Change your environment by getting rid of all cigarettes, ashtrays, matches, and lighters in your home, car, or work. Do not let people smoke in your home.  Review your past attempts to quit. Think about what worked and what did not. GET SUPPORT AND ENCOURAGEMENT You have a better chance of being successful if you have help. You can get support in many ways.  Tell your family, friends, and co-workers that you are going to quit and need their support. Ask them not to smoke around you.  Get individual, group, or telephone counseling and support. Programs are available at local hospitals and health centers. Call your local health department for   information about programs in your area.  Spiritual beliefs and practices may help some smokers quit.  Download a "quit meter" on your computer to keep track of quit statistics, such as how long you have gone without smoking, cigarettes not smoked, and money saved.  Get a self-help book about quitting smoking and staying off of tobacco. LEARN NEW SKILLS AND BEHAVIORS  Distract yourself from urges to smoke. Talk to someone, go for a walk, or occupy your time with a task.  Change your normal routine. Take a different route to work. Drink tea instead of coffee.  Eat breakfast in a different place.  Reduce your stress. Take a hot bath, exercise, or read a book.  Plan something enjoyable to do every day. Reward yourself for not smoking.  Explore interactive web-based programs that specialize in helping you quit. GET MEDICINE AND USE IT CORRECTLY Medicines can help you stop smoking and decrease the urge to smoke. Combining medicine with the above behavioral methods and support can greatly increase your chances of successfully quitting smoking.  Nicotine replacement therapy helps deliver nicotine to your body without the negative effects and risks of smoking. Nicotine replacement therapy includes nicotine gum, lozenges, inhalers, nasal sprays, and skin patches. Some may be available over-the-counter and others require a prescription.  Antidepressant medicine helps people abstain from smoking, but how this works is unknown. This medicine is available by prescription.  Nicotinic receptor partial agonist medicine simulates the effect of nicotine in your brain. This medicine is available by prescription. Ask your caregiver for advice about which medicines to use and how to use them based on your health history. Your caregiver will tell you what side effects to look out for if you choose to be on a medicine or therapy. Carefully read the information on the package. Do not use any other product containing nicotine while using a nicotine replacement product.  RELAPSE OR DIFFICULT SITUATIONS Most relapses occur within the first 3 months after quitting. Do not be discouraged if you start smoking again. Remember, most people try several times before finally quitting. You may have symptoms of withdrawal because your body is used to nicotine. You may crave cigarettes, be irritable, feel very hungry, cough often, get headaches, or have difficulty concentrating. The withdrawal symptoms are only temporary. They are strongest when you first quit, but they will go away within  10 14 days. To reduce the chances of relapse, try to:  Avoid drinking alcohol. Drinking lowers your chances of successfully quitting.  Reduce the amount of caffeine you consume. Once you quit smoking, the amount of caffeine in your body increases and can give you symptoms, such as a rapid heartbeat, sweating, and anxiety.  Avoid smokers because they can make you want to smoke.  Do not let weight gain distract you. Many smokers will gain weight when they quit, usually less than 10 pounds. Eat a healthy diet and stay active. You can always lose the weight gained after you quit.  Find ways to improve your mood other than smoking. FOR MORE INFORMATION  www.smokefree.gov  Document Released: 11/06/2001 Document Revised: 05/13/2012 Document Reviewed: 02/21/2012 [employer] Patient Information 2014 [employer].  

## 2013-05-04 NOTE — Telephone Encounter (Signed)
appt made

## 2013-05-15 ENCOUNTER — Encounter: Payer: Self-pay | Admitting: Nurse Practitioner

## 2013-05-15 ENCOUNTER — Ambulatory Visit (INDEPENDENT_AMBULATORY_CARE_PROVIDER_SITE_OTHER): Payer: Medicare Other | Admitting: Nurse Practitioner

## 2013-05-15 VITALS — BP 139/74 | HR 64 | Temp 97.4°F | Ht 67.0 in | Wt 146.5 lb

## 2013-05-15 DIAGNOSIS — E039 Hypothyroidism, unspecified: Secondary | ICD-10-CM

## 2013-05-15 DIAGNOSIS — J449 Chronic obstructive pulmonary disease, unspecified: Secondary | ICD-10-CM

## 2013-05-15 DIAGNOSIS — F411 Generalized anxiety disorder: Secondary | ICD-10-CM

## 2013-05-15 DIAGNOSIS — IMO0001 Reserved for inherently not codable concepts without codable children: Secondary | ICD-10-CM

## 2013-05-15 DIAGNOSIS — I10 Essential (primary) hypertension: Secondary | ICD-10-CM

## 2013-05-15 DIAGNOSIS — F419 Anxiety disorder, unspecified: Secondary | ICD-10-CM

## 2013-05-15 LAB — COMPLETE METABOLIC PANEL WITH GFR
Albumin: 4.7 g/dL (ref 3.5–5.2)
CO2: 27 mEq/L (ref 19–32)
GFR, Est African American: 74 mL/min
GFR, Est Non African American: 65 mL/min
Glucose, Bld: 84 mg/dL (ref 70–99)
Potassium: 4.3 mEq/L (ref 3.5–5.3)
Sodium: 140 mEq/L (ref 135–145)
Total Bilirubin: 0.4 mg/dL (ref 0.3–1.2)
Total Protein: 7 g/dL (ref 6.0–8.3)

## 2013-05-15 MED ORDER — LEVOTHYROXINE SODIUM 112 MCG PO TABS: 112.0000 ug | ORAL_TABLET | Freq: Every day | ORAL | Status: DC

## 2013-05-15 MED ORDER — ALPRAZOLAM 1 MG PO TABS: 1.0000 mg | ORAL_TABLET | Freq: Three times a day (TID) | ORAL | Status: DC | PRN

## 2013-05-15 MED ORDER — FLUTICASONE PROPIONATE HFA 44 MCG/ACT IN AERO: 1.0000 | INHALATION_SPRAY | Freq: Two times a day (BID) | RESPIRATORY_TRACT | Status: DC

## 2013-05-15 MED ORDER — DILTIAZEM HCL ER 180 MG PO CP24
180.0000 mg | ORAL_CAPSULE | Freq: Every day | ORAL | Status: DC
Start: 2013-05-15 — End: 2013-08-12

## 2013-05-15 MED ORDER — LISINOPRIL 40 MG PO TABS: 40.0000 mg | ORAL_TABLET | Freq: Every day | ORAL | Status: DC

## 2013-05-15 NOTE — Progress Notes (Signed)
  Subjective:    Patient ID: [name], female    DOB: [birth year], 72 y.o.   MRN: [id number]  Hypertension This is a chronic problem. The current episode started more than 1 year ago. The problem has been resolved since onset. The problem is controlled. Pertinent negatives include no blurred vision, headaches, malaise/fatigue, orthopnea, palpitations or sweats. Agents associated with hypertension include thyroid hormones. Risk factors for coronary artery disease include post-menopausal state. Past treatments include ACE inhibitors. The current treatment provides significant improvement. Compliance problems include diet and exercise.  Hypertensive end-organ damage includes CVA (2002) and a thyroid problem.  Thyroid Problem Presents for follow-up (hypothyroidism) visit. Patient reports no cold intolerance, depressed mood, diarrhea, dry skin, menstrual problem, palpitations or visual change. The symptoms have been stable.  GAD/PTSD Xanax takes at least 1 x per day    Review of Systems  Constitutional: Negative for malaise/fatigue.  Eyes: Negative for blurred vision.  Cardiovascular: Negative for palpitations and orthopnea.  Gastrointestinal: Negative for diarrhea.  Endocrine: Negative for cold intolerance.  Genitourinary: Negative for menstrual problem.  Neurological: Negative for headaches.  All other systems reviewed and are negative.       Objective:   Physical Exam  Constitutional: She is oriented to person, place, and time. She appears well-developed and well-nourished.  HENT:  Nose: Nose normal.  Mouth/Throat: Oropharynx is clear and moist.  Eyes: EOM are normal.  Neck: Trachea normal, normal range of motion and full passive range of motion without pain. Neck supple. No JVD present. Carotid bruit is not present. No thyromegaly present.  Cardiovascular: Normal rate, regular rhythm, normal heart sounds and intact distal pulses.  Exam reveals no gallop and no friction rub.   No  murmur heard. Pulmonary/Chest: Effort normal and breath sounds normal.  Abdominal: Soft. Bowel sounds are normal. She exhibits no distension and no mass. There is no tenderness.  Musculoskeletal: Normal range of motion.  Lymphadenopathy:    She has no cervical adenopathy.  Neurological: She is alert and oriented to person, place, and time. She has normal reflexes.  Skin: Skin is warm and dry.  Psychiatric: She has a normal mood and affect. Her behavior is normal. Judgment and thought content normal.     BP 139/74  Pulse 64  Temp(Src) 97.4 F (36.3 C) (Oral)  Ht 5\' 7"  (1.702 m)  Wt 146 lb 8 oz (66.452 kg)  BMI 22.94 kg/m2      Assessment & Plan:  1. Anxiety disorder Stress management - ALPRAZolam (XANAX) 1 MG tablet; Take 1 tablet (1 mg total) by mouth 3 (three) times daily as needed for sleep.  Dispense: 90 tablet; Refill: 2  2. Hypothyroidism  - levothyroxine (SYNTHROID) 112 MCG tablet; Take 1 tablet (112 mcg total) by mouth daily.  Dispense: 30 tablet; Refill: 3 - Thyroid Panel With TSH  3. Hypertension Low NA + diet - diltiazem (DILACOR XR) 180 MG 24 hr capsule; Take 1 capsule (180 mg total) by mouth daily.  Dispense: 30 capsule; Refill: 5 - lisinopril (PRINIVIL,ZESTRIL) 40 MG tablet; Take 1 tablet (40 mg total) by mouth daily.  Dispense: 30 tablet; Refill: 5 - COMPLETE METABOLIC PANEL WITH GFR - NMR Lipoprofile with Lipids  4. COPD bronchitis Try to QUIT SMOKING - fluticasone (FLOVENT HFA) 44 MCG/ACT inhaler; Inhale 1 puff into the lungs 2 (two) times daily.  Dispense: 1 Inhaler; Refill: 11  Mary-Margaret Daphine Deutscher, FNP

## 2013-05-15 NOTE — Patient Instructions (Signed)

## 2013-05-18 LAB — NMR LIPOPROFILE WITH LIPIDS
HDL Size: 9.9 nm (ref >=9.2)
LDL Size: 20.8 nm (ref >20.5)
Large HDL-P: 11 umol/L (ref >=4.8)
Large VLDL-P: 1.5 nmol/L (ref <=2.7)
Small LDL Particle Number: 119 nmol/L (ref <=527)

## 2013-08-12 ENCOUNTER — Encounter: Payer: Self-pay | Admitting: Nurse Practitioner

## 2013-08-12 ENCOUNTER — Ambulatory Visit (INDEPENDENT_AMBULATORY_CARE_PROVIDER_SITE_OTHER): Payer: Medicare Other | Admitting: Nurse Practitioner

## 2013-08-12 VITALS — BP 114/68 | HR 72 | Temp 97.7°F | Ht 67.0 in | Wt 147.0 lb

## 2013-08-12 DIAGNOSIS — F411 Generalized anxiety disorder: Secondary | ICD-10-CM

## 2013-08-12 DIAGNOSIS — I10 Essential (primary) hypertension: Secondary | ICD-10-CM

## 2013-08-12 DIAGNOSIS — E039 Hypothyroidism, unspecified: Secondary | ICD-10-CM

## 2013-08-12 DIAGNOSIS — R5381 Other malaise: Secondary | ICD-10-CM

## 2013-08-12 DIAGNOSIS — F419 Anxiety disorder, unspecified: Secondary | ICD-10-CM

## 2013-08-12 MED ORDER — ALPRAZOLAM 1 MG PO TABS: 1.0000 mg | ORAL_TABLET | Freq: Three times a day (TID) | ORAL | Status: DC | PRN

## 2013-08-12 MED ORDER — LEVOTHYROXINE SODIUM 112 MCG PO TABS: 112.0000 ug | ORAL_TABLET | Freq: Every day | ORAL | Status: DC

## 2013-08-12 MED ORDER — DILTIAZEM HCL ER 180 MG PO CP24: 180.0000 mg | ORAL_CAPSULE | Freq: Every day | ORAL | Status: DC

## 2013-08-12 MED ORDER — LISINOPRIL 40 MG PO TABS: 40.0000 mg | ORAL_TABLET | Freq: Every day | ORAL | Status: DC

## 2013-08-12 NOTE — Patient Instructions (Signed)

## 2013-08-12 NOTE — Progress Notes (Signed)
Subjective:    Patient ID: [name], female    DOB: January 26, 1941, 72 y.o.   MRN: [id number]  Hypertension This is a chronic problem. The current episode started more than 1 year ago. The problem is unchanged. The problem is controlled. Pertinent negatives include no anxiety, blurred vision, chest pain, headaches, neck pain, palpitations, peripheral edema, PND or shortness of breath. There are no associated agents to hypertension. Risk factors for coronary artery disease include family history and post-menopausal state. Past treatments include ACE inhibitors and beta blockers. The current treatment provides significant improvement. There are no compliance problems.  Hypertensive end-organ damage includes CVA (TIA) and a thyroid problem.  Thyroid Problem Presents for follow-up (hyprothyroidism) visit. Patient reports no anxiety, cold intolerance, constipation, depressed mood, diaphoresis, diarrhea, dry skin, hair loss, heat intolerance, leg swelling, palpitations, tremors, visual change, weight gain or weight loss. The symptoms have been stable.  Smoker occassional use of flovent when she starts wheezing.   Review of Systems  Constitutional: Negative for weight loss, weight gain and diaphoresis.  HENT: Negative for neck pain.   Eyes: Negative for blurred vision.  Respiratory: Negative for shortness of breath.   Cardiovascular: Negative for chest pain, palpitations and PND.  Gastrointestinal: Negative for diarrhea and constipation.  Endocrine: Negative for cold intolerance and heat intolerance.  Neurological: Negative for tremors and headaches.       Objective:   Physical Exam  Constitutional: She is oriented to person, place, and time. She appears well-developed and well-nourished.  HENT:  Nose: Nose normal.  Mouth/Throat: Oropharynx is clear and moist.  Eyes: EOM are normal.  Neck: Trachea normal, normal range of motion and full passive range of motion without pain. Neck supple. No  JVD present. Carotid bruit is not present. No thyromegaly present.  Cardiovascular: Normal rate, regular rhythm, normal heart sounds and intact distal pulses.  Exam reveals no gallop and no friction rub.   No murmur heard. Pulmonary/Chest: Effort normal and breath sounds normal.  Abdominal: Soft. Bowel sounds are normal. She exhibits no distension and no mass. There is no tenderness.  Musculoskeletal: Normal range of motion.  Lymphadenopathy:    She has no cervical adenopathy.  Neurological: She is alert and oriented to person, place, and time. She has normal reflexes.  Skin: Skin is warm and dry.  Psychiatric: She has a normal mood and affect. Her behavior is normal. Judgment and thought content normal.   BP 114/68  Pulse 72  Temp(Src) 97.7 F (36.5 C) (Oral)  Ht 5\' 7"  (1.702 m)  Wt 147 lb (66.679 kg)  BMI 23.02 kg/m2        Assessment & Plan:  1. Hypertension Low NA+ diet2 - CMP14+EGFR - NMR, lipoprofile - lisinopril (PRINIVIL,ZESTRIL) 40 MG tablet; Take 1 tablet (40 mg total) by mouth daily.  Dispense: 30 tablet; Refill: 5 - diltiazem (DILACOR XR) 180 MG 24 hr capsule; Take 1 capsule (180 mg total) by mouth daily.  Dispense: 30 capsule; Refill: 5  2. Hypothyroidism - Thyroid Panel With TSH - levothyroxine (SYNTHROID) 112 MCG tablet; Take 1 tablet (112 mcg total) by mouth daily.  Dispense: 30 tablet; Refill: 3  3. Other malaise and fatigue rest - Vit D  25 hydroxy (rtn osteoporosis monitoring)  4. Anxiety disorder Stress management - ALPRAZolam (XANAX) 1 MG tablet; Take 1 tablet (1 mg total) by mouth 3 (three) times daily as needed for sleep.  Dispense: 90 tablet; Refill: 2  Health maintenance reviewed Mary-Margaret Daphine Deutscher, FNP

## 2013-08-14 LAB — THYROID PANEL WITH TSH
Free Thyroxine Index: 2.9 (ref 1.2–4.9)
TSH: 1.52 u[IU]/mL (ref 0.450–4.500)

## 2013-08-14 LAB — CMP14+EGFR
Albumin/Globulin Ratio: 1.9 (ref 1.1–2.5)
Albumin: 4.4 g/dL (ref 3.5–4.8)
BUN: 9 mg/dL (ref 8–27)
GFR calc Af Amer: 96 mL/min/{1.73_m2} (ref >59)
GFR calc non Af Amer: 83 mL/min/{1.73_m2} (ref >59)
Glucose: 84 mg/dL (ref 65–99)
Total Bilirubin: 0.3 mg/dL (ref 0.0–1.2)
Total Protein: 6.7 g/dL (ref 6.0–8.5)

## 2013-08-14 LAB — NMR, LIPOPROFILE
Cholesterol: 129 mg/dL (ref <200)
HDL Particle Number: 33.6 umol/L (ref >=30.5)
LDL Size: 21.3 nm (ref >20.5)
LP-IR Score: 35 (ref <=45)
Small LDL Particle Number: <90 nmol/L (ref <=527)

## 2013-08-17 ENCOUNTER — Ambulatory Visit: Payer: Self-pay | Admitting: Nurse Practitioner

## 2013-08-18 ENCOUNTER — Telehealth: Payer: Self-pay | Admitting: Nurse Practitioner

## 2013-08-18 NOTE — Telephone Encounter (Signed)
Pt notified of lab results Verbalizes understanding 

## 2013-08-19 ENCOUNTER — Other Ambulatory Visit: Payer: Self-pay | Admitting: Nurse Practitioner

## 2013-10-01 ENCOUNTER — Other Ambulatory Visit: Payer: Self-pay

## 2013-10-05 ENCOUNTER — Encounter: Payer: Self-pay | Admitting: Family Medicine

## 2013-10-05 ENCOUNTER — Telehealth: Payer: Self-pay | Admitting: Nurse Practitioner

## 2013-10-05 ENCOUNTER — Ambulatory Visit (INDEPENDENT_AMBULATORY_CARE_PROVIDER_SITE_OTHER): Payer: Medicare Other | Admitting: Family Medicine

## 2013-10-05 VITALS — BP 129/69 | HR 71 | Temp 97.9°F | Ht 67.0 in | Wt 148.0 lb

## 2013-10-05 DIAGNOSIS — I10 Essential (primary) hypertension: Secondary | ICD-10-CM

## 2013-10-05 DIAGNOSIS — J309 Allergic rhinitis, unspecified: Secondary | ICD-10-CM

## 2013-10-05 DIAGNOSIS — J0100 Acute maxillary sinusitis, unspecified: Secondary | ICD-10-CM

## 2013-10-05 DIAGNOSIS — E039 Hypothyroidism, unspecified: Secondary | ICD-10-CM

## 2013-10-05 MED ORDER — FLUTICASONE PROPIONATE 50 MCG/ACT NA SUSP: 2.0000 | Freq: Every day | NASAL | Status: DC

## 2013-10-05 MED ORDER — AMOXICILLIN 500 MG PO CAPS: 500.0000 mg | ORAL_CAPSULE | Freq: Three times a day (TID) | ORAL | Status: DC

## 2013-10-05 NOTE — Progress Notes (Signed)
Subjective:    Patient ID: [name], female    DOB: 1941-06-09, 72 y.o.   MRN: [id number]  HPI Patient here today for cough and congestion. This started 5-7 days ago with fatigue. She's been taking Claritin because of increased congestion and burning in her last period about 3 days ago she started sneezing and developed soreness in her neck. She also developed a sore throat and cough and myalgias over the weekend.     Patient Active Problem List   Diagnosis Date Noted  . Urinary tract infection 07/05/2011  . ANKLE SPRAIN, LEFT 09/12/2009  . CHOLELITHIASIS, ASYMPTOMATIC 08/24/2008  . VITAMIN D DEFICIENCY 07/23/2008  . TRANSAMINASES, SERUM, ELEVATED 07/22/2008  . POSTMENOPAUSAL STATUS 10/02/2007  . TOBACCO ABUSE 03/03/2007  . HYPOTHYROIDISM 12/13/2006  . ANXIETY 12/13/2006  . DEPRESSION 12/13/2006  . GLAUCOMA NOS 12/13/2006  . HYPERTENSION 12/13/2006  . TRANSIENT ISCHEMIC ATTACK, HX OF 12/13/2006   Outpatient Encounter Prescriptions as of 10/05/2013  Medication Sig  . ALPRAZolam (XANAX) 1 MG tablet Take 1 tablet (1 mg total) by mouth 3 (three) times daily as needed for sleep.  Marland Kitchen aspirin (ASPIR-81) 81 MG EC tablet Take 81 mg by mouth daily.    Marland Kitchen diltiazem (DILACOR XR) 180 MG 24 hr capsule Take 1 capsule (180 mg total) by mouth daily.  . fluticasone (FLOVENT HFA) 44 MCG/ACT inhaler Inhale 1 puff into the lungs 2 (two) times daily.  Marland Kitchen levothyroxine (SYNTHROID) 112 MCG tablet Take 1 tablet (112 mcg total) by mouth daily.  Marland Kitchen lisinopril (PRINIVIL,ZESTRIL) 40 MG tablet Take 1 tablet (40 mg total) by mouth daily.    Review of Systems  Constitutional: Positive for fatigue. Negative for fever and chills.  HENT: Positive for congestion, sinus pressure and sneezing. Negative for ear pain and postnasal drip.   Eyes: Negative.   Respiratory: Positive for cough.   Cardiovascular: Negative.   Gastrointestinal: Negative.   Endocrine: Negative.   Genitourinary: Negative.    Musculoskeletal: Positive for myalgias.  Skin: Negative.   Allergic/Immunologic: Negative.   Neurological: Negative.   Hematological: Negative.   Psychiatric/Behavioral: Negative.        Objective:   Physical Exam  Nursing note and vitals reviewed. Constitutional: She is oriented to person, place, and time. She appears well-developed and well-nourished. No distress.  For her age  HENT:  Head: Normocephalic and atraumatic.  Right Ear: External ear normal.  Left Ear: External ear normal.  Mouth/Throat: No oropharyngeal exudate.  There is definite nasal congestion and turbinate swelling bilaterally. She does have anterior cervical tenderness. Throat was slightly red posteriorly and bilaterally  Eyes: Conjunctivae and EOM are normal. Right eye exhibits no discharge. Left eye exhibits no discharge. No scleral icterus.  Neck: Normal range of motion. Neck supple. No thyromegaly present.  There was bilateral anterior cervical tenderness without adenopathy  Cardiovascular: Normal rate and normal heart sounds.  Exam reveals no friction rub.   No murmur heard. Heart has a slightly irregular rhythm at 72 per minute  Pulmonary/Chest: Effort normal and breath sounds normal. No respiratory distress. She has no wheezes. She has no rales.  Patient had a dry cough  Musculoskeletal: Normal range of motion.  Lymphadenopathy:    She has no cervical adenopathy.  Neurological: She is alert and oriented to person, place, and time.  Skin: Skin is warm and dry. No rash noted.  Psychiatric: She has a normal mood and affect. Her behavior is normal. Judgment and thought content normal.   BP 129/69  Pulse 71  Temp(Src) 97.9 F (36.6 C) (Oral)  Ht 5\' 7"  (1.702 m)  Wt 148 lb (67.132 kg)  BMI 23.17 kg/m2        Assessment & Plan:    1. Sinusitis, acute, maxillary   2. HYPERTENSION   3. HYPOTHYROIDISM   4. Allergic rhinitis    Meds ordered this encounter  Medications  . amoxicillin (AMOXIL)  500 MG capsule    Sig: Take 1 capsule (500 mg total) by mouth 3 (three) times daily.    Dispense:  30 capsule    Refill:  0  . fluticasone (FLONASE) 50 MCG/ACT nasal spray    Sig: Place 2 sprays into both nostrils daily.    Dispense:  16 g    Refill:  6   Patient Instructions  Drink plenty of fluids Use prescribed medication as directed Take Mucinex maximum strength, blue and white in color, over-the-counter, one twice daily with a large glass of water Use prescription nose spray as directed Also use saline nose spray during the day You may continue to take the Claritin    Nyra Capes MD

## 2013-10-05 NOTE — Patient Instructions (Signed)
Drink plenty of fluids Use prescribed medication as directed Take Mucinex maximum strength, blue and white in color, over-the-counter, one twice daily with a large glass of water Use prescription nose spray as directed Also use saline nose spray during the day You may continue to take the Claritin

## 2013-10-05 NOTE — Telephone Encounter (Signed)
Appt given for today 

## 2013-11-13 ENCOUNTER — Ambulatory Visit (INDEPENDENT_AMBULATORY_CARE_PROVIDER_SITE_OTHER): Payer: Medicare Other | Admitting: Nurse Practitioner

## 2013-11-13 ENCOUNTER — Encounter: Payer: Self-pay | Admitting: Nurse Practitioner

## 2013-11-13 VITALS — BP 117/65 | HR 70 | Temp 97.3°F | Wt 151.0 lb

## 2013-11-13 DIAGNOSIS — F419 Anxiety disorder, unspecified: Secondary | ICD-10-CM

## 2013-11-13 DIAGNOSIS — E559 Vitamin D deficiency, unspecified: Secondary | ICD-10-CM

## 2013-11-13 DIAGNOSIS — F411 Generalized anxiety disorder: Secondary | ICD-10-CM

## 2013-11-13 DIAGNOSIS — R5381 Other malaise: Secondary | ICD-10-CM

## 2013-11-13 DIAGNOSIS — Z1231 Encounter for screening mammogram for malignant neoplasm of breast: Secondary | ICD-10-CM

## 2013-11-13 DIAGNOSIS — F329 Major depressive disorder, single episode, unspecified: Secondary | ICD-10-CM

## 2013-11-13 DIAGNOSIS — I10 Essential (primary) hypertension: Secondary | ICD-10-CM

## 2013-11-13 DIAGNOSIS — E039 Hypothyroidism, unspecified: Secondary | ICD-10-CM

## 2013-11-13 DIAGNOSIS — R5383 Other fatigue: Secondary | ICD-10-CM

## 2013-11-13 MED ORDER — ALPRAZOLAM 1 MG PO TABS: 1.0000 mg | ORAL_TABLET | Freq: Three times a day (TID) | ORAL | Status: DC | PRN

## 2013-11-13 NOTE — Progress Notes (Signed)
Subjective:    Patient ID: [name], female    DOB: [birth year], 72 y.o.   MRN: [id number]  Patient here today for follow up- she has no complaints today othe rthan she still has some sinus congestion.  Hypertension This is a chronic problem. The current episode started more than 1 year ago. The problem is unchanged. The problem is controlled. Pertinent negatives include no anxiety, blurred vision, chest pain, headaches, neck pain, palpitations, peripheral edema, PND or shortness of breath. There are no associated agents to hypertension. Risk factors for coronary artery disease include family history and post-menopausal state. Past treatments include ACE inhibitors and beta blockers. The current treatment provides significant improvement. There are no compliance problems.  Hypertensive end-organ damage includes CVA (TIA) and a thyroid problem.  Thyroid Problem Presents for follow-up (hyprothyroidism) visit. Symptoms include fatigue. Patient reports no anxiety, cold intolerance, constipation, depressed mood, diaphoresis, diarrhea, dry skin, hair loss, heat intolerance, leg swelling, palpitations, tremors, visual change, weight gain or weight loss. The symptoms have been stable.  Smoker occassional use of flovent when she starts wheezing. PTSD Takes xanax as needed- was deserted by her husband for 7 years Review of Systems  Constitutional: Positive for appetite change (decreased) and fatigue. Negative for weight loss, weight gain and diaphoresis.  Eyes: Negative for blurred vision.  Respiratory: Negative for shortness of breath.   Cardiovascular: Negative for chest pain, palpitations and PND.  Gastrointestinal: Negative for diarrhea and constipation.  Endocrine: Negative for cold intolerance and heat intolerance.  Musculoskeletal: Negative for neck pain.  Neurological: Negative for tremors and headaches.       Objective:   Physical Exam  Constitutional: She is oriented to person, place,  and time. She appears well-developed and well-nourished.  HENT:  Nose: Nose normal.  Mouth/Throat: Oropharynx is clear and moist.  Eyes: EOM are normal.  Neck: Trachea normal, normal range of motion and full passive range of motion without pain. Neck supple. No JVD present. Carotid bruit is not present. No thyromegaly present.  Cardiovascular: Normal rate, regular rhythm, normal heart sounds and intact distal pulses.  Exam reveals no gallop and no friction rub.   No murmur heard. Pulmonary/Chest: Effort normal and breath sounds normal.  Abdominal: Soft. Bowel sounds are normal. She exhibits no distension and no mass. There is no tenderness.  Musculoskeletal: Normal range of motion.  Lymphadenopathy:    She has no cervical adenopathy.  Neurological: She is alert and oriented to person, place, and time. She has normal reflexes.  Skin: Skin is warm and dry.  Psychiatric: She has a normal mood and affect. Her behavior is normal. Judgment and thought content normal.   BP 117/65  Pulse 70  Temp(Src) 97.3 F (36.3 C) (Oral)  Wt 151 lb (68.493 kg)        Assessment & Plan:   1. VITAMIN D DEFICIENCY   2. HYPOTHYROIDISM   3. HYPERTENSION   4. DEPRESSION   5. ANXIETY   6. Screening mammogram for high-risk patient   7. Fatigue   8. Anxiety disorder    Orders Placed This Encounter  Procedures  . MM Digital Screening    Standing Status: Future     Number of Occurrences:      Standing Expiration Date: 01/14/2015    Order Specific Question:  Reason for Exam (SYMPTOM  OR DIAGNOSIS REQUIRED)    Answer:  screening    Order Specific Question:  Preferred imaging location?    Answer:  [facility]  .  CMP14+EGFR  . Anemia Profile B  . NMR, lipoprofile  . Thyroid Panel With TSH  . Vit D  25 hydroxy (rtn osteoporosis monitoring)   Meds ordered this encounter  Medications  . ALPRAZolam (XANAX) 1 MG tablet    Sig: Take 1 tablet (1 mg total) by mouth 3 (three) times daily as needed  for sleep.    Dispense:  90 tablet    Refill:  2    Order Specific Question:  Supervising Provider    Answer:  Deborra Medina    Continue all meds Labs pending Diet and exercise encouraged Health maintenance reviewed Follow up in 3 months  Mary-Margaret Daphine Deutscher, FNP

## 2013-11-13 NOTE — Patient Instructions (Signed)

## 2013-11-15 LAB — ANEMIA PROFILE B
Basophils Absolute: 0 10*3/uL (ref 0.0–0.2)
Ferritin: 44 ng/mL (ref 15–150)
Folate: 17.4 ng/mL (ref >3.0)
Immature Grans (Abs): 0 10*3/uL (ref 0.0–0.1)
Immature Granulocytes: 0 %
Iron Saturation: 22 % (ref 15–55)
Lymphocytes Absolute: 2.9 10*3/uL (ref 0.7–3.1)
Lymphs: 48 %
Monocytes: 6 %
Neutrophils Relative %: 44 %
Platelets: 220 10*3/uL (ref 150–379)
RDW: 13.4 % (ref 12.3–15.4)
Retic Ct Pct: 0.6 % (ref 0.6–2.6)
Vitamin B-12: 272 pg/mL (ref 211–946)
WBC: 5.9 10*3/uL (ref 3.4–10.8)

## 2013-11-15 LAB — THYROID PANEL WITH TSH
Free Thyroxine Index: 3.4 (ref 1.2–4.9)
T3 Uptake Ratio: 28 % (ref 24–39)
T4, Total: 12.3 ug/dL — ABNORMAL HIGH (ref 4.5–12.0)

## 2013-11-15 LAB — CMP14+EGFR
ALT: 8 IU/L (ref 0–32)
Albumin/Globulin Ratio: 2.1 (ref 1.1–2.5)
Albumin: 4.4 g/dL (ref 3.5–4.8)
Calcium: 9.7 mg/dL (ref 8.6–10.2)
Chloride: 100 mmol/L (ref 97–108)
Creatinine, Ser: 0.89 mg/dL (ref 0.57–1.00)
GFR calc Af Amer: 75 mL/min/{1.73_m2} (ref >59)
GFR calc non Af Amer: 65 mL/min/{1.73_m2} (ref >59)
Glucose: 86 mg/dL (ref 65–99)
Potassium: 4 mmol/L (ref 3.5–5.2)
Total Bilirubin: 0.3 mg/dL (ref 0.0–1.2)
Total Protein: 6.5 g/dL (ref 6.0–8.5)

## 2013-11-15 LAB — NMR, LIPOPROFILE
Cholesterol: 134 mg/dL (ref <200)
LDL Particle Number: 429 nmol/L (ref <1000)
LDL Size: 21 nm (ref >20.5)
LDLC SERPL CALC-MCNC: 54 mg/dL (ref <100)
LP-IR Score: <25 (ref <=45)
Small LDL Particle Number: <90 nmol/L (ref <=527)

## 2013-11-16 ENCOUNTER — Ambulatory Visit: Payer: Self-pay | Admitting: Nurse Practitioner

## 2013-11-26 HISTORY — PX: COLONOSCOPY: SHX174

## 2013-12-09 ENCOUNTER — Other Ambulatory Visit: Payer: Self-pay | Admitting: Nurse Practitioner

## 2013-12-30 ENCOUNTER — Encounter: Payer: Self-pay | Admitting: Family Medicine

## 2013-12-30 ENCOUNTER — Ambulatory Visit (INDEPENDENT_AMBULATORY_CARE_PROVIDER_SITE_OTHER): Payer: Medicare Other | Admitting: Family Medicine

## 2013-12-30 VITALS — BP 130/73 | HR 72 | Temp 97.0°F | Ht 67.0 in | Wt 153.0 lb

## 2013-12-30 DIAGNOSIS — J329 Chronic sinusitis, unspecified: Secondary | ICD-10-CM

## 2013-12-30 MED ORDER — METHYLPREDNISOLONE (PAK) 4 MG PO TABS: ORAL_TABLET | ORAL | Status: DC

## 2013-12-30 MED ORDER — AMOXICILLIN 875 MG PO TABS: 875.0000 mg | ORAL_TABLET | Freq: Two times a day (BID) | ORAL | Status: DC

## 2013-12-30 NOTE — Progress Notes (Signed)
   Subjective:    Patient ID: [name], female    DOB: [birth year], 73 y.o.   MRN: [id number]  HPI This 73 y.o. female presents for evaluation of uri and facial pain.   Review of Systems No chest pain, SOB, HA, dizziness, vision change, N/V, diarrhea, constipation, dysuria, urinary urgency or frequency, myalgias, arthralgias or rash.     Objective:   Physical Exam  Vital signs noted  Well developed well nourished female.  HEENT - Head atraumatic Normocephalic                Eyes - PERRLA, Conjuctiva - clear Sclera- Clear EOMI                Ears - EAC's Wnl TM's Wnl Gross Hearing WNL                Nose - Nares patent                 Throat - oropharanx wnl Respiratory - Lungs CTA bilateral Cardiac - RRR S1 and S2 without murmur GI - Abdomen soft Nontender and bowel sounds active x 4 Extremities - No edema. Neuro - Grossly intact.      Assessment & Plan:  Sinusitis - Plan: amoxicillin (AMOXIL) 875 MG tablet, methylPREDNIsolone (MEDROL DOSPACK) 4 MG tablet Push po fluids, rest, tylenol and motrin otc prn as directed for fever, arthralgias, and myalgias.  Follow up prn if sx's continue or persist.  Lysbeth Penner FNP

## 2014-01-15 ENCOUNTER — Other Ambulatory Visit: Payer: Self-pay | Admitting: *Deleted

## 2014-01-15 MED ORDER — ALBUTEROL SULFATE HFA 108 (90 BASE) MCG/ACT IN AERS: 2.0000 | INHALATION_SPRAY | Freq: Four times a day (QID) | RESPIRATORY_TRACT | Status: DC | PRN

## 2014-01-15 NOTE — Telephone Encounter (Signed)
Received a request from [place] for a refill on Proair hfa 90 mcg inhaler. Do not see on med list. Please advise

## 2014-02-12 ENCOUNTER — Ambulatory Visit (INDEPENDENT_AMBULATORY_CARE_PROVIDER_SITE_OTHER): Payer: Medicare Other | Admitting: Nurse Practitioner

## 2014-02-12 ENCOUNTER — Encounter: Payer: Self-pay | Admitting: Nurse Practitioner

## 2014-02-12 VITALS — BP 139/70 | HR 66 | Temp 96.8°F | Ht 67.0 in | Wt 160.4 lb

## 2014-02-12 DIAGNOSIS — I10 Essential (primary) hypertension: Secondary | ICD-10-CM

## 2014-02-12 DIAGNOSIS — R5383 Other fatigue: Secondary | ICD-10-CM

## 2014-02-12 DIAGNOSIS — E039 Hypothyroidism, unspecified: Secondary | ICD-10-CM

## 2014-02-12 DIAGNOSIS — F3289 Other specified depressive episodes: Secondary | ICD-10-CM

## 2014-02-12 DIAGNOSIS — R5381 Other malaise: Secondary | ICD-10-CM

## 2014-02-12 DIAGNOSIS — R635 Abnormal weight gain: Secondary | ICD-10-CM

## 2014-02-12 DIAGNOSIS — R928 Other abnormal and inconclusive findings on diagnostic imaging of breast: Secondary | ICD-10-CM

## 2014-02-12 DIAGNOSIS — F329 Major depressive disorder, single episode, unspecified: Secondary | ICD-10-CM

## 2014-02-12 DIAGNOSIS — F411 Generalized anxiety disorder: Secondary | ICD-10-CM

## 2014-02-12 DIAGNOSIS — N63 Unspecified lump in unspecified breast: Secondary | ICD-10-CM

## 2014-02-12 MED ORDER — LISINOPRIL 40 MG PO TABS: 40.0000 mg | ORAL_TABLET | Freq: Every day | ORAL | Status: DC

## 2014-02-12 MED ORDER — DILTIAZEM HCL ER 180 MG PO CP24: 180.0000 mg | ORAL_CAPSULE | Freq: Every day | ORAL | Status: DC

## 2014-02-12 MED ORDER — LEVOTHYROXINE SODIUM 112 MCG PO TABS: ORAL_TABLET | ORAL | Status: DC

## 2014-02-12 NOTE — Patient Instructions (Signed)

## 2014-02-12 NOTE — Progress Notes (Signed)
Subjective:    Patient ID: [name], female    DOB: [birth year], 73 y.o.   MRN: [id number]  Patient here today for follow up of chronic medical problems- she has several complaints today: *Had partial mastectomy years ago but ended up not being breast cancer- now she has a lump in that breast and wants checked. * patient is also concerned about her recent weight gain- 15lbs since last visit- denies SOB or sweeling * very fatigued   Hypertension This is a chronic problem. The current episode started more than 1 year ago. The problem is unchanged. The problem is controlled. Pertinent negatives include no anxiety, blurred vision, chest pain, headaches, neck pain, palpitations, peripheral edema, PND or shortness of breath. There are no associated agents to hypertension. Risk factors for coronary artery disease include family history and post-menopausal state. Past treatments include ACE inhibitors and beta blockers. The current treatment provides significant improvement. There are no compliance problems.  Hypertensive end-organ damage includes CVA (TIA) and a thyroid problem.  Thyroid Problem Presents for follow-up (hyprothyroidism) visit. Patient reports no anxiety, cold intolerance, constipation, depressed mood, diaphoresis, diarrhea, dry skin, hair loss, heat intolerance, leg swelling, palpitations, tremors, visual change, weight gain or weight loss. The symptoms have been stable.  Smoker occassional use of flovent when she starts wheezing. GAD/depression Only thing she is taking is xanax $Remove'1mg'TsqGILB$ - takes at least 1 x a day   Review of Systems  Constitutional: Negative for weight loss, weight gain and diaphoresis.  Eyes: Negative for blurred vision.  Respiratory: Negative for shortness of breath.   Cardiovascular: Negative for chest pain, palpitations and PND.  Gastrointestinal: Negative for diarrhea and constipation.  Endocrine: Negative for cold intolerance and heat intolerance.    Musculoskeletal: Negative for neck pain.  Neurological: Negative for tremors and headaches.       Objective:   Physical Exam  Constitutional: She is oriented to person, place, and time. She appears well-developed and well-nourished.  HENT:  Nose: Nose normal.  Mouth/Throat: Oropharynx is clear and moist.  Eyes: EOM are normal.  Neck: Trachea normal, normal range of motion and full passive range of motion without pain. Neck supple. No JVD present. Carotid bruit is not present. No thyromegaly present.  Cardiovascular: Normal rate, regular rhythm, normal heart sounds and intact distal pulses.  Exam reveals no gallop and no friction rub.   No murmur heard. Pulmonary/Chest: Effort normal and breath sounds normal. Right breast exhibits mass. Right breast exhibits no inverted nipple, no nipple discharge, no skin change and no tenderness. Left breast exhibits no inverted nipple, no mass, no nipple discharge, no skin change and no tenderness.    Hard nontender nodue in scar on right breast  Abdominal: Soft. Bowel sounds are normal. She exhibits no distension and no mass. There is no tenderness.  Musculoskeletal: Normal range of motion.  Lymphadenopathy:    She has no cervical adenopathy.  Neurological: She is alert and oriented to person, place, and time. She has normal reflexes.  Skin: Skin is warm and dry.  Psychiatric: She has a normal mood and affect. Her behavior is normal. Judgment and thought content normal.   BP 139/70  Pulse 66  Temp(Src) 96.8 F (36 C) (Oral)  Ht $R'5\' 7"'TO$  (1.702 m)  Wt 160 lb 6.4 oz (72.757 kg)  BMI 25.12 kg/m2        Assessment & Plan:   1. HYPOTHYROIDISM lABS PENDING - Thyroid Panel With TSH - levothyroxine (SYNTHROID, LEVOTHROID) 112 MCG tablet;  TAKE (1) TABLET BY MOUTH ONCE DAILY.  Dispense: 30 tablet; Refill: 5  2. HYPERTENSION LOW na DIET - CMP14+EGFR - NMR, lipoprofile  3. DEPRESSION STRESS MANAGEMENT  4. ANXIETY CONTINUE XANAX1MG - ASK  PATIENT TO DECREASE TO 0.5 AND ONLY TAKE WHEN NEEDED  5. Breast nodule - MM Digital Diagnostic Unilat R; Future - MM Digital Screening Unilat L; Future  6. Other malaise and fatigue LABS PENDING - Anemia Profile B  7. Weight gain DIET AND EXERCISE  HEALTH MAINTENANCE REVIEWED   8. Hypertension Low Na diet - diltiazem (DILACOR XR) 180 MG 24 hr capsule; Take 1 capsule (180 mg total) by mouth daily.  Dispense: 30 capsule; Refill: 5 - lisinopril (PRINIVIL,ZESTRIL) 40 MG tablet; Take 1 tablet (40 mg total) by mouth daily.  Dispense: 30 tablet; Refill: [place], FNP

## 2014-02-14 LAB — ANEMIA PROFILE B
BASOS: 1 %
Basophils Absolute: 0 10*3/uL (ref 0.0–0.2)
Eos: 1 %
Eosinophils Absolute: 0.1 10*3/uL (ref 0.0–0.4)
Ferritin: 44 ng/mL (ref 15–150)
Folate: 16 ng/mL (ref >3.0)
HEMATOCRIT: 41.3 % (ref 34.0–46.6)
HEMOGLOBIN: 15 g/dL (ref 11.1–15.9)
IRON: 46 ug/dL (ref 35–155)
Immature Grans (Abs): 0 10*3/uL (ref 0.0–0.1)
Immature Granulocytes: 0 %
Iron Saturation: 13 % — ABNORMAL LOW (ref 15–55)
LYMPHS: 38 %
Lymphocytes Absolute: 3.1 10*3/uL (ref 0.7–3.1)
MCH: 32.3 pg (ref 26.6–33.0)
MCHC: 36.3 g/dL — AB (ref 31.5–35.7)
MCV: 89 fL (ref 79–97)
MONOCYTES: 6 %
Monocytes Absolute: 0.4 10*3/uL (ref 0.1–0.9)
NEUTROS ABS: 4.4 10*3/uL (ref 1.4–7.0)
Neutrophils Relative %: 54 %
Platelets: 250 10*3/uL (ref 150–379)
RBC: 4.65 x10E6/uL (ref 3.77–5.28)
RDW: 13.7 % (ref 12.3–15.4)
RETIC CT PCT: 1.1 % (ref 0.6–2.6)
TIBC: 359 ug/dL (ref 250–450)
UIBC: 313 ug/dL (ref 150–375)
Vitamin B-12: 262 pg/mL (ref 211–946)
WBC: 8.1 10*3/uL (ref 3.4–10.8)

## 2014-02-14 LAB — CMP14+EGFR
ALBUMIN: 4.4 g/dL (ref 3.5–4.8)
ALK PHOS: 84 IU/L (ref 39–117)
ALT: 6 IU/L (ref 0–32)
AST: 15 IU/L (ref 0–40)
Albumin/Globulin Ratio: 1.9 (ref 1.1–2.5)
BUN/Creatinine Ratio: 14 (ref 11–26)
BUN: 13 mg/dL (ref 8–27)
CALCIUM: 9.4 mg/dL (ref 8.7–10.3)
CO2: 23 mmol/L (ref 18–29)
CREATININE: 0.92 mg/dL (ref 0.57–1.00)
Chloride: 100 mmol/L (ref 97–108)
GFR calc Af Amer: 72 mL/min/{1.73_m2} (ref >59)
GFR calc non Af Amer: 63 mL/min/{1.73_m2} (ref >59)
GLOBULIN, TOTAL: 2.3 g/dL (ref 1.5–4.5)
Glucose: 91 mg/dL (ref 65–99)
Potassium: 4 mmol/L (ref 3.5–5.2)
SODIUM: 143 mmol/L (ref 134–144)
Total Bilirubin: 0.3 mg/dL (ref 0.0–1.2)
Total Protein: 6.7 g/dL (ref 6.0–8.5)

## 2014-02-14 LAB — NMR, LIPOPROFILE
Cholesterol: 135 mg/dL (ref <200)
HDL CHOLESTEROL BY NMR: 66 mg/dL (ref >=40)
HDL PARTICLE NUMBER: 36.3 umol/L (ref >=30.5)
LDL Particle Number: 514 nmol/L (ref <1000)
LDL Size: 21.9 nm (ref >20.5)
LDLC SERPL CALC-MCNC: 52 mg/dL (ref <100)
LP-IR SCORE: 40 (ref <=45)
Small LDL Particle Number: <90 nmol/L (ref <=527)
Triglycerides by NMR: 83 mg/dL (ref <150)

## 2014-02-14 LAB — THYROID PANEL WITH TSH
Free Thyroxine Index: 3.2 (ref 1.2–4.9)
T3 UPTAKE RATIO: 27 % (ref 24–39)
T4, Total: 11.7 ug/dL (ref 4.5–12.0)
TSH: 2.7 u[IU]/mL (ref 0.450–4.500)

## 2014-02-15 ENCOUNTER — Telehealth: Payer: Self-pay | Admitting: Nurse Practitioner

## 2014-02-15 DIAGNOSIS — F419 Anxiety disorder, unspecified: Secondary | ICD-10-CM

## 2014-02-15 DIAGNOSIS — I10 Essential (primary) hypertension: Secondary | ICD-10-CM

## 2014-02-15 MED ORDER — ALPRAZOLAM 1 MG PO TABS: 1.0000 mg | ORAL_TABLET | Freq: Three times a day (TID) | ORAL | Status: DC | PRN

## 2014-02-15 NOTE — Telephone Encounter (Signed)
Please call in xanax 1 mg 1 po TID #90  with 0 refills Diltiazem was sent to pharmacy Friday while here.

## 2014-02-15 NOTE — Telephone Encounter (Signed)
Called in.

## 2014-03-04 ENCOUNTER — Telehealth: Payer: Self-pay | Admitting: Nurse Practitioner

## 2014-03-04 NOTE — Telephone Encounter (Signed)
Patient notified that NTBS and appt given for tomorrow with Dietrich Pates per patients request

## 2014-03-04 NOTE — Telephone Encounter (Signed)
Spoke with pt and she is coughing up clear and having clear nasal drainage . SOB worse now . Denies swelling but verbalizes weight gain. States has gained 10 lbs since Nov. States her abd feels "swollen" .  Dont think running fever but had chills last week C/o extremely weak

## 2014-03-05 ENCOUNTER — Ambulatory Visit (INDEPENDENT_AMBULATORY_CARE_PROVIDER_SITE_OTHER): Payer: Medicare Other | Admitting: Family Medicine

## 2014-03-05 ENCOUNTER — Encounter: Payer: Self-pay | Admitting: Family Medicine

## 2014-03-05 VITALS — BP 152/68 | HR 68 | Temp 97.4°F | Ht 67.0 in | Wt 159.0 lb

## 2014-03-05 DIAGNOSIS — J329 Chronic sinusitis, unspecified: Secondary | ICD-10-CM

## 2014-03-05 MED ORDER — BENZONATATE 100 MG PO CAPS: 100.0000 mg | ORAL_CAPSULE | Freq: Three times a day (TID) | ORAL | Status: DC | PRN

## 2014-03-05 MED ORDER — AMOXICILLIN 875 MG PO TABS: 875.0000 mg | ORAL_TABLET | Freq: Two times a day (BID) | ORAL | Status: DC

## 2014-03-05 MED ORDER — METHYLPREDNISOLONE ACETATE 80 MG/ML IJ SUSP
80.0000 mg | Freq: Once | INTRAMUSCULAR | Status: AC
  Administered 2014-03-05: 80 mg via INTRAMUSCULAR

## 2014-03-05 NOTE — Progress Notes (Signed)
   Subjective:    Patient ID: [name], female    DOB: 11/12/1941, 73 y.o.   MRN: [id number]  HPI This 73 y.o. female presents for evaluation of URI sx's and cough.   Review of Systems C/o cough No chest pain, SOB, HA, dizziness, vision change, N/V, diarrhea, constipation, dysuria, urinary urgency or frequency, myalgias, arthralgias or rash.     Objective:   Physical Exam Vital signs noted  Well developed well nourished female.  HEENT - Head atraumatic Normocephalic                Eyes - PERRLA, Conjuctiva - clear Sclera- Clear EOMI                Ears - EAC's Wnl TM's Wnl Gross Heari                Throat - oropharanx wnl Respiratory - Lungs CTA bilateral Cardiac - RRR S1 and S2 without murmur GI - Abdomen soft Nontender and bowel sounds active x 4 Extremities - No edema. Neuro - Grossly intact.       Assessment & Plan:  Sinusitis - Plan: amoxicillin (AMOXIL) 875 MG tablet, methylPREDNISolone acetate (DEPO-MEDROL) injection 80 mg, benzonatate (TESSALON PERLES) 100 MG capsule  Push po fluids, rest, tylenol and motrin otc prn as directed for fever, arthralgias, and myalgias.  Follow up prn if sx's continue or persist. Lysbeth Penner FNP

## 2014-05-07 ENCOUNTER — Other Ambulatory Visit: Payer: Self-pay | Admitting: *Deleted

## 2014-05-07 ENCOUNTER — Telehealth: Payer: Self-pay | Admitting: Nurse Practitioner

## 2014-05-07 DIAGNOSIS — I10 Essential (primary) hypertension: Secondary | ICD-10-CM

## 2014-05-07 MED ORDER — LISINOPRIL 40 MG PO TABS: 40.0000 mg | ORAL_TABLET | Freq: Every day | ORAL | Status: DC

## 2014-05-17 ENCOUNTER — Ambulatory Visit (INDEPENDENT_AMBULATORY_CARE_PROVIDER_SITE_OTHER): Payer: Medicare Other | Admitting: Nurse Practitioner

## 2014-05-17 ENCOUNTER — Encounter: Payer: Self-pay | Admitting: Nurse Practitioner

## 2014-05-17 VITALS — BP 128/60 | HR 75 | Temp 98.4°F | Ht 67.0 in | Wt 158.2 lb

## 2014-05-17 DIAGNOSIS — F411 Generalized anxiety disorder: Secondary | ICD-10-CM

## 2014-05-17 DIAGNOSIS — F3289 Other specified depressive episodes: Secondary | ICD-10-CM

## 2014-05-17 DIAGNOSIS — F329 Major depressive disorder, single episode, unspecified: Secondary | ICD-10-CM

## 2014-05-17 DIAGNOSIS — E039 Hypothyroidism, unspecified: Secondary | ICD-10-CM

## 2014-05-17 DIAGNOSIS — Z13820 Encounter for screening for osteoporosis: Secondary | ICD-10-CM

## 2014-05-17 DIAGNOSIS — Z8679 Personal history of other diseases of the circulatory system: Secondary | ICD-10-CM

## 2014-05-17 DIAGNOSIS — I10 Essential (primary) hypertension: Secondary | ICD-10-CM

## 2014-05-17 DIAGNOSIS — E559 Vitamin D deficiency, unspecified: Secondary | ICD-10-CM

## 2014-05-17 MED ORDER — ALPRAZOLAM 1 MG PO TABS: 1.0000 mg | ORAL_TABLET | Freq: Three times a day (TID) | ORAL | Status: DC | PRN

## 2014-05-17 NOTE — Patient Instructions (Signed)

## 2014-05-17 NOTE — Addendum Note (Signed)
Addended by: Chevis Pretty on: 05/17/2014 02:40 PM   Modules accepted: Orders

## 2014-05-17 NOTE — Progress Notes (Signed)
Subjective:    Patient ID: [name], female    DOB: May 23, 1941, 73 y.o.   MRN: [id number]  Patient here today for follow up of chronic medical problems- she has several complaints today: *Had partial mastectomy years ago but ended up not being breast cancer- now she has a lump in that breast and wants checked. She still has not had breast mass checked- she is going to call when she gets home today.  * occasional cramps in feet and legs at night- hasn't occurred in the last week.  Hypertension This is a chronic problem. The current episode started more than 1 year ago. The problem is unchanged. The problem is controlled. Pertinent negatives include no anxiety, blurred vision, chest pain, headaches, neck pain, palpitations, peripheral edema, PND or shortness of breath. There are no associated agents to hypertension. Risk factors for coronary artery disease include family history and post-menopausal state. Past treatments include ACE inhibitors and beta blockers. The current treatment provides significant improvement. There are no compliance problems.  Hypertensive end-organ damage includes CVA (TIA) and a thyroid problem.  Thyroid Problem Presents for follow-up (hyprothyroidism) visit. Patient reports no anxiety, cold intolerance, constipation, depressed mood, diaphoresis, diarrhea, dry skin, hair loss, heat intolerance, leg swelling, palpitations, tremors, visual change, weight gain or weight loss. The symptoms have been stable.  Smoker occassional use of flovent when she starts wheezing. GAD/depression Only thing she is taking is xanax $Remove'1mg'HXzEPia$ - takes at least 1 x a day   Review of Systems  Constitutional: Negative for weight loss, weight gain and diaphoresis.  Eyes: Negative for blurred vision.  Respiratory: Negative for shortness of breath.   Cardiovascular: Negative for chest pain, palpitations and PND.  Gastrointestinal: Negative for diarrhea and constipation.  Endocrine: Negative for  cold intolerance and heat intolerance.  Musculoskeletal: Negative for neck pain.  Neurological: Negative for tremors and headaches.       Objective:   Physical Exam  Constitutional: She is oriented to person, place, and time. She appears well-developed and well-nourished.  HENT:  Nose: Nose normal.  Mouth/Throat: Oropharynx is clear and moist.  Eyes: EOM are normal.  Neck: Trachea normal, normal range of motion and full passive range of motion without pain. Neck supple. No JVD present. Carotid bruit is not present. No thyromegaly present.  Cardiovascular: Normal rate, regular rhythm, normal heart sounds and intact distal pulses.  Exam reveals no gallop and no friction rub.   No murmur heard. Pulmonary/Chest: Effort normal and breath sounds normal. Right breast exhibits mass. Right breast exhibits no inverted nipple, no nipple discharge, no skin change and no tenderness. Left breast exhibits no inverted nipple, no mass, no nipple discharge, no skin change and no tenderness.    Hard nontender nodue in scar on right breast  Abdominal: Soft. Bowel sounds are normal. She exhibits no distension and no mass. There is no tenderness.  Musculoskeletal: Normal range of motion.  Lymphadenopathy:    She has no cervical adenopathy.  Neurological: She is alert and oriented to person, place, and time. She has normal reflexes.  Skin: Skin is warm and dry.  Psychiatric: She has a normal mood and affect. Her behavior is normal. Judgment and thought content normal.   BP 128/60  Pulse 75  Temp(Src) 98.4 F (36.9 C) (Oral)  Ht $R'5\' 7"'WI$  (1.702 m)  Wt 158 lb 3.2 oz (71.759 kg)  BMI 24.77 kg/m2        Assessment & Plan:   1. TRANSIENT ISCHEMIC ATTACK, HX  OF   2. VITAMIN D DEFICIENCY   3. HYPOTHYROIDISM   4. HYPERTENSION   5. ANXIETY   6. DEPRESSION    Orders Placed This Encounter  Procedures  . CMP14+EGFR  . NMR, lipoprofile  . Thyroid Panel With TSH  . Vit D  25 hydroxy (rtn osteoporosis  monitoring)   Meds ordered this encounter  Medications  . ALPRAZolam (XANAX) 1 MG tablet    Sig: Take 1 tablet (1 mg total) by mouth 3 (three) times daily as needed for sleep.    Dispense:  90 tablet    Refill:  2    Order Specific Question:  Supervising Provider    Answer:  Chipper Herb [[id number]]    hemoccult cards given to patient with directions Labs pending Health maintenance reviewed Diet and exercise encouraged Continue all meds Follow up  In 3 months    [place], FNP

## 2014-05-18 ENCOUNTER — Other Ambulatory Visit: Payer: Self-pay | Admitting: Nurse Practitioner

## 2014-05-18 DIAGNOSIS — N6310 Unspecified lump in the right breast, unspecified quadrant: Secondary | ICD-10-CM

## 2014-05-18 LAB — VITAMIN D 25 HYDROXY (VIT D DEFICIENCY, FRACTURES): Vit D, 25-Hydroxy: 24.7 ng/mL — ABNORMAL LOW (ref 30.0–100.0)

## 2014-05-18 LAB — CMP14+EGFR
ALBUMIN: 4.4 g/dL (ref 3.5–4.8)
ALK PHOS: 83 IU/L (ref 39–117)
ALT: 14 IU/L (ref 0–32)
AST: 16 IU/L (ref 0–40)
Albumin/Globulin Ratio: 2 (ref 1.1–2.5)
BILIRUBIN TOTAL: 0.3 mg/dL (ref 0.0–1.2)
BUN/Creatinine Ratio: 10 — ABNORMAL LOW (ref 11–26)
BUN: 8 mg/dL (ref 8–27)
CHLORIDE: 100 mmol/L (ref 97–108)
CO2: 24 mmol/L (ref 18–29)
Calcium: 9.7 mg/dL (ref 8.7–10.3)
Creatinine, Ser: 0.77 mg/dL (ref 0.57–1.00)
GFR calc Af Amer: 89 mL/min/{1.73_m2} (ref >59)
GFR calc non Af Amer: 77 mL/min/{1.73_m2} (ref >59)
Globulin, Total: 2.2 g/dL (ref 1.5–4.5)
Glucose: 95 mg/dL (ref 65–99)
Potassium: 4.2 mmol/L (ref 3.5–5.2)
SODIUM: 141 mmol/L (ref 134–144)
Total Protein: 6.6 g/dL (ref 6.0–8.5)

## 2014-05-18 LAB — NMR, LIPOPROFILE
CHOLESTEROL: 129 mg/dL (ref 100–199)
HDL Cholesterol by NMR: 64 mg/dL (ref >39)
HDL PARTICLE NUMBER: 29.3 umol/L — AB (ref >=30.5)
LDL PARTICLE NUMBER: 522 nmol/L (ref <1000)
LDL SIZE: 21.2 nm (ref >20.5)
LDLC SERPL CALC-MCNC: 51 mg/dL (ref 0–99)
LP-IR Score: <25 (ref <=45)
Small LDL Particle Number: <90 nmol/L (ref <=527)
Triglycerides by NMR: 71 mg/dL (ref 0–149)

## 2014-05-18 LAB — THYROID PANEL WITH TSH
Free Thyroxine Index: 3.3 (ref 1.2–4.9)
T3 UPTAKE RATIO: 29 % (ref 24–39)
T4, Total: 11.5 ug/dL (ref 4.5–12.0)
TSH: 1.23 u[IU]/mL (ref 0.450–4.500)

## 2014-05-26 ENCOUNTER — Encounter (HOSPITAL_COMMUNITY): Payer: Self-pay

## 2014-05-26 ENCOUNTER — Other Ambulatory Visit: Payer: Self-pay | Admitting: Nurse Practitioner

## 2014-05-26 ENCOUNTER — Ambulatory Visit (HOSPITAL_COMMUNITY)
Admission: RE | Admit: 2014-05-26 | Discharge: 2014-05-26 | Disposition: A | Discharge status: Home / Self Care | Payer: Medicare Other | Source: Ambulatory Visit | Attending: Nurse Practitioner | Admitting: Nurse Practitioner

## 2014-05-26 DIAGNOSIS — N6310 Unspecified lump in the right breast, unspecified quadrant: Secondary | ICD-10-CM

## 2014-05-26 DIAGNOSIS — N63 Unspecified lump in unspecified breast: Secondary | ICD-10-CM | POA: Insufficient documentation

## 2014-05-26 NOTE — Procedures (Signed)
Biopsy of right breast mass 1 o'c performed under ultrasound guidance. Clip placed.Tolerated well. EBL minimal.

## 2014-05-26 NOTE — Discharge Instructions (Signed)
Breast Biopsy A breast biopsy is a test during which a sample of tissue is taken from your breast. The breast tissue is looked at under a microscope for cancer cells.  BEFORE THE PROCEDURE  Make plans to have someone drive you home after the test.  Do not smoke for 2 weeks before the test. Stop smoking, if you smoke.  Do not drink alcohol for 24 hours before the test.  Wear a good support bra to the test. PROCEDURE  You may be given one of the following:  A medicine to numb the breast area (local anesthesia).  A medicine to make you sleep (general anesthesia). There are different types of breast biopsies. They include:  Fine-needle aspiration.  A needle is put into the breast lump.  The needle takes out fluid and cells from the lump.  Ultrasound imaging may be used to help find the lump and to put the needle it the right spot.  Core-needle biopsy.  A needle is put into the breast lump.  The needle is put in your breast 3-6 times.  The needle removes breast tissue.  An ultrasound image or X-ray is often used to find the right spot to put in the needle.  Stereotactic biopsy.  X-rays and a computer are used to study X-ray pictures of the breast lump.  The computer finds where the needle needs to be put into the breast.  Tissue samples are taken out.  Vacuum-assisted biopsy.  A small cut (incision) is made in your breast.  A biopsy device is put through the cut and into the breast tissue.  The biopsy device draws abnormal breast tissue into the biopsy device.  A large tissue sample is often removed.  No stitches are needed.  Ultrasound-guided core-needle biopsy.  Ultrasound imaging helps guide the needle into the area of the breast that is not normal.  A cut is made in the breast. The needle is put in the needle.  Tissue samples are taken out.  Open biopsy.  A large cut is made in the breast.  Your doctor will try to remove the whole breast lump or as  much as possible. All tissue, fluid, or cell samples are looked at under a microscope.  AFTER THE PROCEDURE  You will be taken to an area to recover. You will be able to go home once you are doing well and are without problems.  You may have bruising on your breast. This is normal.  A pressure bandage (dressing) may be put on your breast for 24-8 hours. This type of bandage is wrapped tightly around your chest. It helps stop fluid from building up underneath tissues. Document Released: 02/04/2012 Document Reviewed: 02/04/2012 [facility] Patient Information 2015 [place]. This information is not intended to replace advice given to you by your health care provider. Make sure you discuss any questions you have with your health care provider.  Breast Biopsy Care After These instructions give you information on caring for yourself after your procedure. Your doctor may also give you more specific instructions. Call your doctor if you have any problems or questions after your procedure. HOME CARE  Only take medicine as told by your doctor.  Do not take aspirin.  Keep your sutures (stitches) dry when bathing.  Protect the biopsy area. Do not let the area get bumped.  Avoid activities that could pull the biopsy site open until your doctor approves. This includes:  Stretching.  Reaching.  Exercise.  Sports.  Lifting more  than 3lb.  Continue your normal diet.  Wear a good support bra for as long as told by your doctor.  Change any bandages (dressings) as told by your doctor.  Do not drink alcohol while taking pain medicine.  Keep all doctor visits as told. Ask when your test results will be ready. Make sure you get your test results. GET HELP RIGHT AWAY IF:   You have a fever.  You have more bleeding (more than a small spot) from the biopsy site.  You have trouble breathing.  You have yellowish-white fluid (pus) coming from the biopsy site.  You have redness,  puffiness (swelling), or more pain in the biopsy site.  You have a bad smell coming from the biopsy site.  Your biopsy site opens after sutures, staples, or sticky strips have been removed.  You have a rash.  You need stronger medicine. MAKE SURE YOU:  Understand these instructions.  Will watch your condition.  Will get help right away if you are not doing well or get worse. Document Released: 09/08/2009 Document Revised: 02/04/2012 Document Reviewed: 12/23/2011 [facility] Patient Information 2015 [place], [place]. This information is not intended to replace advice given to you by your health care provider. Make sure you discuss any questions you have with your health care provider.

## 2014-06-02 ENCOUNTER — Telehealth: Payer: Self-pay | Admitting: Nurse Practitioner

## 2014-06-03 NOTE — Telephone Encounter (Signed)
Not back yet- when were they sent off

## 2014-06-04 ENCOUNTER — Telehealth: Payer: Self-pay | Admitting: Nurse Practitioner

## 2014-06-04 NOTE — Telephone Encounter (Signed)
Calling about about labs she sent to [facility] ? Not back yet it was a FOBT?

## 2014-06-04 NOTE — Telephone Encounter (Signed)
Please check on this.

## 2014-06-07 ENCOUNTER — Encounter (INDEPENDENT_AMBULATORY_CARE_PROVIDER_SITE_OTHER): Payer: Medicare Other | Admitting: General Surgery

## 2014-06-07 ENCOUNTER — Telehealth: Payer: Self-pay | Admitting: Nurse Practitioner

## 2014-06-08 NOTE — Telephone Encounter (Signed)
Patient needs an FOBT. She was given one at her visit but she mailed it in instead of bringing to our office.  She spoke with someone yesterday who told her they would place one up front for her to pickup today.

## 2014-06-11 ENCOUNTER — Ambulatory Visit (INDEPENDENT_AMBULATORY_CARE_PROVIDER_SITE_OTHER): Payer: Medicare Other | Admitting: General Surgery

## 2014-06-11 ENCOUNTER — Other Ambulatory Visit: Payer: Medicare Other

## 2014-06-11 ENCOUNTER — Encounter (INDEPENDENT_AMBULATORY_CARE_PROVIDER_SITE_OTHER): Payer: Self-pay | Admitting: General Surgery

## 2014-06-11 VITALS — BP 116/74 | HR 66 | Temp 97.3°F | Resp 16 | Ht 67.0 in | Wt 153.6 lb

## 2014-06-11 DIAGNOSIS — R197 Diarrhea, unspecified: Secondary | ICD-10-CM

## 2014-06-11 DIAGNOSIS — Z1212 Encounter for screening for malignant neoplasm of rectum: Secondary | ICD-10-CM

## 2014-06-11 DIAGNOSIS — C50219 Malignant neoplasm of upper-inner quadrant of unspecified female breast: Secondary | ICD-10-CM

## 2014-06-11 DIAGNOSIS — C50211 Malignant neoplasm of upper-inner quadrant of right female breast: Secondary | ICD-10-CM

## 2014-06-11 DIAGNOSIS — K921 Melena: Secondary | ICD-10-CM

## 2014-06-11 NOTE — Progress Notes (Signed)
Patient ID: [name], female   DOB: 14-Jan-1941, 73 y.o.   MRN: [id number]  No chief complaint on file.   [name] is a 73 y.o. female.  We are asked to see the patient in consultation by Dr. Redge Gainer to evaluate her for a right-sided breast cancer. The patient is a 73 year old white female who first noticed a lump in the upper inner right breast about 3 months ago. She denies any pain in her breasts. She denies any discharge from her nipple. Her nipple on that side has been inverted since a lumpectomy done in 2002 for benign disease. She underwent ultrasound which did show a mass in this location which was involving the skin and measured up to 3 cm in diameter. 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 She also notes blood in her stool with intermittent diarrhea over the last 2 months. She denies any abdominal pain. Her weight has been stable. She denies any nausea and vomiting. She does have family history significant for a sister with breast cancer.  Tonya  Past Medical History  Diagnosis Date  . Depression   . Hypertension   . Glaucoma   . Anxiety   . Transient ischemic attack   . Hypothyroidism   . Glaucoma   . Hypothyroidism   . Right ankle sprain     hx of  . H/O partial mastectomy     2001 or 2002    Past Surgical History  Procedure Laterality Date  . Carotid endarterectomy  10/2001    Left, by Dr. Deon Pilling.  . Laparoscopic cholecystectomy w/ cholangiography  03/2010    With liver biopsy. Dr. Marlou Starks.  . Left brest biopsy other  2002    Benign  . Surgery for glaucoma, cataract, other      Family History  Problem Relation Age of Onset  . Breast cancer Sister     in 2002  . Glaucoma      surgery for glaucoma and cataract  . Coronary artery disease Brother     Negative history of   . Drug abuse Brother   . Hypertension Mother   . Hypothyroidism Mother   . Hypertension  Father     Social History History  Substance Use Topics  . Smoking status: Current Every Day Smoker -- 2.00 packs/day for 50 years    Types: Cigarettes  . Smokeless tobacco: Not on file  . Alcohol Use: No    Allergies  Allergen Reactions  . Lactose Intolerance (Gi)     Current Outpatient Prescriptions  Medication Sig Dispense Refill  . albuterol (PROAIR HFA) 108 (90 BASE) MCG/ACT inhaler Inhale 2 puffs into the lungs every 6 (six) hours as needed for wheezing or shortness of breath.  1 Inhaler  1  . ALPRAZolam (XANAX) 1 MG tablet Take 1 tablet (1 mg total) by mouth 3 (three) times daily as needed for sleep.  90 tablet  2  . aspirin (ASPIR-81) 81 MG EC tablet Take 81 mg by mouth daily.        Marland Kitchen diltiazem (DILACOR XR) 180 MG 24 hr capsule Take 1 capsule (180 mg total) by mouth daily.  30 capsule  5  . fluticasone (FLOVENT HFA) 44 MCG/ACT inhaler Inhale 1 puff into the lungs 2 (two) times daily.  1 Inhaler  11  . levothyroxine (SYNTHROID, LEVOTHROID) 112 MCG tablet TAKE (1) TABLET BY  MOUTH ONCE DAILY.  30 tablet  5  . lisinopril (PRINIVIL,ZESTRIL) 40 MG tablet Take 1 tablet (40 mg total) by mouth daily.  30 tablet  2   No current facility-administered medications for this visit.    Review of Systems Review of Systems  Constitutional: Negative.   HENT: Negative.   Eyes: Negative.   Respiratory: Negative.   Cardiovascular: Negative.   Gastrointestinal: Positive for diarrhea and blood in stool.  Endocrine: Negative.   Genitourinary: Negative.   Musculoskeletal: Negative.   Skin: Negative.   Allergic/Immunologic: Negative.   Neurological: Negative.   Hematological: Negative.   Psychiatric/Behavioral: Negative.     Blood pressure 116/74, pulse 66, temperature 97.3 F (36.3 C), temperature source Temporal, resp. rate 16, height $RemoveBe'5\' 7"'NdSGpfxww$  (1.702 m), weight 153 lb 9.6 oz (69.673 kg).  Physical Exam Physical Exam  Constitutional: She is oriented to person, place, and time. She  appears well-developed and well-nourished.  HENT:  Head: Normocephalic and atraumatic.  Eyes: Conjunctivae and EOM are normal. Pupils are equal, round, and reactive to light.  Neck: Normal range of motion. Neck supple.  Cardiovascular: Normal rate, regular rhythm and normal heart sounds.   Pulmonary/Chest: Effort normal and breath sounds normal.  There is a mass in the upper inner right breast approximately 3 cm in diameter that is invading the skin with a nodule in the skin. There is no redness associated with it. There is no palpable mass in the left breast. There is no palpable axillary, supraclavicular, or cervical lymphadenopathy.  Abdominal: Soft. Bowel sounds are normal. She exhibits no mass. There is no tenderness.  Musculoskeletal: Normal range of motion.  Lymphadenopathy:    She has no cervical adenopathy.  Neurological: She is alert and oriented to person, place, and time.  Skin: Skin is warm and dry.  Psychiatric: She has a normal mood and affect. Her behavior is normal.    Data Reviewed As above  Assessment    The patient appears to have a 3 cm invasive cancer in the upper inner right breast that is invading the skin. I've talked her in detail about the different options for treatment but given these findings I think she might benefit from neoadjuvant chemotherapy. If she were to require surgery today she would need a right mastectomy and sentinel node evaluation. I will plan to refer her to a medical oncologist to talk about the possibility of neoadjuvant therapy. She would like to be treated by medical oncology in [place] at [facility] and we will make that referral. A blue she also requires a GI evaluation given the blood in her stool and diarrhea. We will refer her to [facility] gastroenterology.    Plan    Plan for referrals to medical oncology and [facility] gastroenterology. If she agrees to neoadjuvant chemotherapy then she will probably require a Port-A-Cath. I discussed  with her in detail the risks and benefits of the operation to place a Port-A-Cath as well as some of the technical aspects and she understands and wishes to proceed. If she decides on surgery first and then she will require a right mastectomy with sentinel node biopsy. I've discussed this with her as well including the risks and benefits of the surgery as well as some of the technical aspects and she understands.       [name],Deneisha Dade S 06/11/2014, 4:28 PM

## 2014-06-11 NOTE — Progress Notes (Signed)
Pt came in for labs only 

## 2014-06-13 LAB — FECAL OCCULT BLOOD, IMMUNOCHEMICAL: FECAL OCCULT BLD: NEGATIVE

## 2014-06-14 ENCOUNTER — Telehealth (INDEPENDENT_AMBULATORY_CARE_PROVIDER_SITE_OTHER): Payer: Self-pay | Admitting: *Deleted

## 2014-06-14 LAB — CLOSTRIDIUM DIFFICILE BY PCR: CDIFFPCR: NEGATIVE

## 2014-06-14 NOTE — Telephone Encounter (Signed)
Tried calling pt to make her aware of her apt with Dr. Oletta Lamas, [facility], 06-28-14 @ 3:00.  Pt's voicemail wasn't set up.  I called son, Gerald Stabs, and made him aware of the apt.  He advised that he would let mother know.  I also let him know that the referral has been put in for Med Oncology in [place], and I was pretty sure they would contact them with the appt, but as soon as I heard back from the office. I would let them know as well.   Gerald Stabs verbalized understanding.  Anderson Malta

## 2014-06-15 LAB — STOOL CULTURE: E COLI SHIGA TOXIN ASSAY: NEGATIVE

## 2014-06-16 LAB — OVA AND PARASITE EXAMINATION

## 2014-06-25 ENCOUNTER — Encounter (INDEPENDENT_AMBULATORY_CARE_PROVIDER_SITE_OTHER): Payer: Medicare Other | Admitting: General Surgery

## 2014-07-02 ENCOUNTER — Other Ambulatory Visit (INDEPENDENT_AMBULATORY_CARE_PROVIDER_SITE_OTHER): Payer: Self-pay | Admitting: General Surgery

## 2014-07-02 ENCOUNTER — Telehealth (INDEPENDENT_AMBULATORY_CARE_PROVIDER_SITE_OTHER): Payer: Self-pay

## 2014-07-02 DIAGNOSIS — C50911 Malignant neoplasm of unspecified site of right female breast: Secondary | ICD-10-CM

## 2014-07-02 NOTE — Telephone Encounter (Signed)
Message copied by Carlene Coria on Fri Jul 02, 2014  2:16 PM ------      Message from: Francee Nodal      Created: Wed Jun 30, 2014  3:40 PM       Pt's daughter called and they have went for her colonoscopy today. Dr Oletta Lamas does not believe she has any ca and Dr Marlou Starks can proceed with scheduling her breast sx.      Thanks       Tonya   ------

## 2014-07-02 NOTE — Telephone Encounter (Addendum)
Surgery orders sent to [facility]. Advised pt to stop asa 5 days prior to surgery.

## 2014-07-02 NOTE — Telephone Encounter (Signed)
Pt returned call. Pt states she did see Dr Annabelle Harman in [place] and was told she will not need chemo prior to surgery. Pt wants to proceed with mastectomy. Pt not sure if she will need a pac for future. She states Dr Marlou Starks may need to verify this with Dr Annabelle Harman. I advised pt that this will be forwarded to [facility] and Dr Marlou Starks. Pt advised if she does not here from our office by Weds to call back and ask for Dr Ethlyn Gallery nurse.

## 2014-07-02 NOTE — Telephone Encounter (Signed)
LMOM to call me back. I need to know if she has seen medical oncology yet. Also which surgery is she ready to schedule? Dr Marlou Starks talked about either getting a PAC for neoadjuvant therapy or proceeding with breast surgery first.

## 2014-07-05 ENCOUNTER — Other Ambulatory Visit: Payer: Self-pay | Admitting: Nurse Practitioner

## 2014-07-05 ENCOUNTER — Other Ambulatory Visit: Payer: Self-pay | Admitting: Family Medicine

## 2014-07-05 ENCOUNTER — Other Ambulatory Visit (INDEPENDENT_AMBULATORY_CARE_PROVIDER_SITE_OTHER): Payer: Medicare Other

## 2014-07-05 ENCOUNTER — Telehealth: Payer: Self-pay | Admitting: Nurse Practitioner

## 2014-07-05 DIAGNOSIS — R300 Dysuria: Secondary | ICD-10-CM

## 2014-07-05 DIAGNOSIS — R350 Frequency of micturition: Secondary | ICD-10-CM

## 2014-07-05 LAB — POCT URINALYSIS DIPSTICK
Bilirubin, UA: NEGATIVE
Blood, UA: NEGATIVE
Glucose, UA: NEGATIVE
Ketones, UA: NEGATIVE
Nitrite, UA: POSITIVE
Protein, UA: NEGATIVE
Spec Grav, UA: 1.01
Urobilinogen, UA: NEGATIVE
pH, UA: 6

## 2014-07-05 LAB — POCT UA - MICROSCOPIC ONLY
Casts, Ur, LPF, POC: NEGATIVE
Crystals, Ur, HPF, POC: NEGATIVE
Mucus, UA: NEGATIVE

## 2014-07-05 MED ORDER — CIPROFLOXACIN HCL 500 MG PO TABS: 500.0000 mg | ORAL_TABLET | Freq: Two times a day (BID) | ORAL | Status: DC

## 2014-07-05 NOTE — Telephone Encounter (Signed)
Already taken care of

## 2014-07-09 ENCOUNTER — Encounter (HOSPITAL_COMMUNITY): Payer: Self-pay | Admitting: Pharmacy Technician

## 2014-07-13 NOTE — Pre-Procedure Instructions (Addendum)
[name]  07/13/2014   Your procedure is scheduled on:  07/15/14  Report to [facility] short stay admitting at 130 PM.  Call this number if you have problems the morning of surgery: [phone]   Remember:   Do not eat food or drink liquids after midnight.   Take these medicines the morning of surgery with A SIP OF WATER: xanax, diltiazem,eye drops, levothyroxine     STOP all herbel meds, nsaids (aleve,naproxen,advil,ibuprofen) including vitamins, aspirin    Do not wear jewelry, make-up or nail polish.  Do not wear lotions, powders, or perfumes. You may wear deodorant.  Do not shave 48 hours prior to surgery. Men may shave face and neck.  Do not bring valuables to the hospital.  [facility] is not responsible                  for any belongings or valuables.               Contacts, dentures or bridgework may not be worn into surgery.  Leave suitcase in the car. After surgery it may be brought to your room.  For patients admitted to the hospital, discharge time is determined by your                treatment team.               Patients discharged the day of surgery will not be allowed to drive  home.  Name and phone number of your driver:   Special Instructions:  Special Instructions: [facility] - Preparing for Surgery  Before surgery, you can play an important role.  Because skin is not sterile, your skin needs to be as free of germs as possible.  You can reduce the number of germs on you skin by washing with CHG (chlorahexidine gluconate) soap before surgery.  CHG is an antiseptic cleaner which kills germs and bonds with the skin to continue killing germs even after washing.  Please DO NOT use if you have an allergy to CHG or antibacterial soaps.  If your skin becomes reddened/irritated stop using the CHG and inform your nurse when you arrive at Short Stay.  Do not shave (including legs and underarms) for at least 48 hours prior to the first CHG shower.  You may shave your  face.  Please follow these instructions carefully:   1.  Shower with CHG Soap the night before surgery and the morning of Surgery.  2.  If you choose to wash your hair, wash your hair first as usual with your normal shampoo.  3.  After you shampoo, rinse your hair and body thoroughly to remove the Shampoo.  4.  Use CHG as you would any other liquid soap.  You can apply chg directly  to the skin and wash gently with scrungie or a clean washcloth.  5.  Apply the CHG Soap to your body ONLY FROM THE NECK DOWN.  Do not use on open wounds or open sores.  Avoid contact with your eyes ears, mouth and genitals (private parts).  Wash genitals (private parts)       with your normal soap.  6.  Wash thoroughly, paying special attention to the area where your surgery will be performed.  7.  Thoroughly rinse your body with warm water from the neck down.  8.  DO NOT shower/wash with your normal soap after using and rinsing off the CHG Soap.  9.  Pat yourself dry  with a clean towel.            10.  Wear clean pajamas.            11.  Place clean sheets on your bed the night of your first shower and do not sleep with pets.  Day of Surgery  Do not apply any lotions/deodorants the morning of surgery.  Please wear clean clothes to the hospital/surgery center.   Please read over the following fact sheets that you were given: Pain Booklet, Coughing and Deep Breathing and Surgical Site Infection Prevention

## 2014-07-14 ENCOUNTER — Encounter (HOSPITAL_COMMUNITY)
Admission: RE | Admit: 2014-07-14 | Discharge: 2014-07-14 | Disposition: A | Discharge status: Home / Self Care | Payer: Medicare Other | Source: Ambulatory Visit | Attending: General Surgery | Admitting: General Surgery

## 2014-07-14 ENCOUNTER — Encounter (HOSPITAL_COMMUNITY)
Admission: RE | Admit: 2014-07-14 | Discharge: 2014-07-14 | Disposition: A | Discharge status: Home / Self Care | Payer: Medicare Other | Source: Ambulatory Visit | Attending: Anesthesiology | Admitting: Anesthesiology

## 2014-07-14 ENCOUNTER — Encounter (HOSPITAL_COMMUNITY): Payer: Self-pay

## 2014-07-14 DIAGNOSIS — Z8673 Personal history of transient ischemic attack (TIA), and cerebral infarction without residual deficits: Secondary | ICD-10-CM | POA: Diagnosis not present

## 2014-07-14 DIAGNOSIS — F17200 Nicotine dependence, unspecified, uncomplicated: Secondary | ICD-10-CM | POA: Diagnosis not present

## 2014-07-14 DIAGNOSIS — F411 Generalized anxiety disorder: Secondary | ICD-10-CM | POA: Diagnosis not present

## 2014-07-14 DIAGNOSIS — E039 Hypothyroidism, unspecified: Secondary | ICD-10-CM | POA: Diagnosis not present

## 2014-07-14 DIAGNOSIS — H409 Unspecified glaucoma: Secondary | ICD-10-CM | POA: Diagnosis not present

## 2014-07-14 DIAGNOSIS — Z79899 Other long term (current) drug therapy: Secondary | ICD-10-CM | POA: Diagnosis not present

## 2014-07-14 DIAGNOSIS — Z7982 Long term (current) use of aspirin: Secondary | ICD-10-CM | POA: Diagnosis not present

## 2014-07-14 DIAGNOSIS — Z91011 Allergy to milk products: Secondary | ICD-10-CM | POA: Diagnosis not present

## 2014-07-14 DIAGNOSIS — Z9889 Other specified postprocedural states: Secondary | ICD-10-CM | POA: Diagnosis not present

## 2014-07-14 DIAGNOSIS — F329 Major depressive disorder, single episode, unspecified: Secondary | ICD-10-CM | POA: Diagnosis not present

## 2014-07-14 DIAGNOSIS — Z9089 Acquired absence of other organs: Secondary | ICD-10-CM | POA: Diagnosis not present

## 2014-07-14 DIAGNOSIS — C50919 Malignant neoplasm of unspecified site of unspecified female breast: Secondary | ICD-10-CM | POA: Diagnosis present

## 2014-07-14 DIAGNOSIS — I10 Essential (primary) hypertension: Secondary | ICD-10-CM | POA: Diagnosis not present

## 2014-07-14 DIAGNOSIS — C773 Secondary and unspecified malignant neoplasm of axilla and upper limb lymph nodes: Secondary | ICD-10-CM | POA: Diagnosis not present

## 2014-07-14 DIAGNOSIS — F3289 Other specified depressive episodes: Secondary | ICD-10-CM | POA: Diagnosis not present

## 2014-07-14 HISTORY — DX: Cerebral infarction, unspecified: I63.9

## 2014-07-14 HISTORY — DX: Personal history of other specified conditions: Z87.898

## 2014-07-14 LAB — CBC
HCT: 42.8 % (ref 36.0–46.0)
Hemoglobin: 14.9 g/dL (ref 12.0–15.0)
MCH: 32.4 pg (ref 26.0–34.0)
MCHC: 34.8 g/dL (ref 30.0–36.0)
MCV: 93 fL (ref 78.0–100.0)
PLATELETS: 254 10*3/uL (ref 150–400)
RBC: 4.6 MIL/uL (ref 3.87–5.11)
RDW: 13.1 % (ref 11.5–15.5)
WBC: 8.9 10*3/uL (ref 4.0–10.5)

## 2014-07-14 LAB — BASIC METABOLIC PANEL
Anion gap: 14 (ref 5–15)
BUN: 7 mg/dL (ref 6–23)
CALCIUM: 9.9 mg/dL (ref 8.4–10.5)
CO2: 26 mEq/L (ref 19–32)
Chloride: 101 mEq/L (ref 96–112)
Creatinine, Ser: 0.84 mg/dL (ref 0.50–1.10)
GFR, EST AFRICAN AMERICAN: 79 mL/min — AB (ref >90)
GFR, EST NON AFRICAN AMERICAN: 68 mL/min — AB (ref >90)
GLUCOSE: 84 mg/dL (ref 70–99)
Potassium: 3.8 mEq/L (ref 3.7–5.3)
SODIUM: 141 meq/L (ref 137–147)

## 2014-07-14 MED ORDER — CHLORHEXIDINE GLUCONATE 4 % EX LIQD
1.0000 | Freq: Once | CUTANEOUS | Status: DC
Start: 2014-07-14 — End: 2014-07-15
  Filled 2014-07-14: qty 15

## 2014-07-14 MED ORDER — CHLORHEXIDINE GLUCONATE 4 % EX LIQD
1.0000 "application " | Freq: Once | CUTANEOUS | Status: DC
  Filled 2014-07-14: qty 15

## 2014-07-14 MED ORDER — CEFAZOLIN SODIUM-DEXTROSE 2-3 GM-% IV SOLR
2.0000 g | INTRAVENOUS | Status: AC
Start: 2014-07-15 — End: 2014-07-15
  Administered 2014-07-15: 2 g via INTRAVENOUS
  Filled 2014-07-14: qty 50

## 2014-07-14 NOTE — Progress Notes (Signed)
Dr toth aware of cxr results 07/13/14

## 2014-07-15 ENCOUNTER — Encounter (HOSPITAL_COMMUNITY)
Admission: RE | Disposition: A | Discharge status: Home / Self Care | Payer: Self-pay | Source: Ambulatory Visit | Attending: General Surgery

## 2014-07-15 ENCOUNTER — Encounter (HOSPITAL_COMMUNITY): Payer: Self-pay | Admitting: *Deleted

## 2014-07-15 ENCOUNTER — Encounter (HOSPITAL_COMMUNITY): Payer: Medicare Other | Admitting: Vascular Surgery

## 2014-07-15 ENCOUNTER — Ambulatory Visit (HOSPITAL_COMMUNITY): Payer: Medicare Other | Admitting: Anesthesiology

## 2014-07-15 ENCOUNTER — Ambulatory Visit (HOSPITAL_COMMUNITY)
Admission: RE | Admit: 2014-07-15 | Discharge: 2014-07-16 | Disposition: A | Discharge status: Home / Self Care | Payer: Medicare Other | Source: Ambulatory Visit | Attending: General Surgery | Admitting: General Surgery

## 2014-07-15 ENCOUNTER — Ambulatory Visit (HOSPITAL_COMMUNITY)
Admission: RE | Admit: 2014-07-15 | Discharge: 2014-07-15 | Disposition: A | Discharge status: Home / Self Care | Payer: Medicare Other | Source: Ambulatory Visit | Attending: General Surgery | Admitting: General Surgery

## 2014-07-15 DIAGNOSIS — F329 Major depressive disorder, single episode, unspecified: Secondary | ICD-10-CM | POA: Insufficient documentation

## 2014-07-15 DIAGNOSIS — Z8673 Personal history of transient ischemic attack (TIA), and cerebral infarction without residual deficits: Secondary | ICD-10-CM | POA: Insufficient documentation

## 2014-07-15 DIAGNOSIS — Z91011 Allergy to milk products: Secondary | ICD-10-CM | POA: Insufficient documentation

## 2014-07-15 DIAGNOSIS — I10 Essential (primary) hypertension: Secondary | ICD-10-CM | POA: Diagnosis not present

## 2014-07-15 DIAGNOSIS — F17200 Nicotine dependence, unspecified, uncomplicated: Secondary | ICD-10-CM | POA: Insufficient documentation

## 2014-07-15 DIAGNOSIS — C50919 Malignant neoplasm of unspecified site of unspecified female breast: Secondary | ICD-10-CM | POA: Diagnosis present

## 2014-07-15 DIAGNOSIS — C50911 Malignant neoplasm of unspecified site of right female breast: Secondary | ICD-10-CM

## 2014-07-15 DIAGNOSIS — F411 Generalized anxiety disorder: Secondary | ICD-10-CM | POA: Insufficient documentation

## 2014-07-15 DIAGNOSIS — E039 Hypothyroidism, unspecified: Secondary | ICD-10-CM | POA: Insufficient documentation

## 2014-07-15 DIAGNOSIS — Z7982 Long term (current) use of aspirin: Secondary | ICD-10-CM | POA: Insufficient documentation

## 2014-07-15 DIAGNOSIS — Z79899 Other long term (current) drug therapy: Secondary | ICD-10-CM | POA: Insufficient documentation

## 2014-07-15 DIAGNOSIS — H409 Unspecified glaucoma: Secondary | ICD-10-CM | POA: Insufficient documentation

## 2014-07-15 DIAGNOSIS — Z9889 Other specified postprocedural states: Secondary | ICD-10-CM | POA: Insufficient documentation

## 2014-07-15 DIAGNOSIS — F3289 Other specified depressive episodes: Secondary | ICD-10-CM | POA: Insufficient documentation

## 2014-07-15 DIAGNOSIS — Z9089 Acquired absence of other organs: Secondary | ICD-10-CM | POA: Insufficient documentation

## 2014-07-15 DIAGNOSIS — C773 Secondary and unspecified malignant neoplasm of axilla and upper limb lymph nodes: Secondary | ICD-10-CM | POA: Diagnosis not present

## 2014-07-15 HISTORY — PX: SIMPLE MASTECTOMY WITH AXILLARY SENTINEL NODE BIOPSY: SHX6098

## 2014-07-15 SURGERY — SIMPLE MASTECTOMY WITH AXILLARY SENTINEL NODE BIOPSY
Anesthesia: General | Site: Breast | Laterality: Right

## 2014-07-15 MED ORDER — KCL IN DEXTROSE-NACL 20-5-0.9 MEQ/L-%-% IV SOLN
INTRAVENOUS | Status: DC
  Administered 2014-07-16: 01:00:00 via INTRAVENOUS
  Filled 2014-07-15 (×2): qty 1000

## 2014-07-15 MED ORDER — DEXAMETHASONE SODIUM PHOSPHATE 10 MG/ML IJ SOLN
INTRAMUSCULAR | Status: DC | PRN
  Administered 2014-07-15: 4 mg via INTRAVENOUS

## 2014-07-15 MED ORDER — LACTATED RINGERS IV SOLN
INTRAVENOUS | Status: DC
  Administered 2014-07-15: 14:00:00 via INTRAVENOUS

## 2014-07-15 MED ORDER — NICOTINE 21 MG/24HR TD PT24
21.0000 mg | MEDICATED_PATCH | TRANSDERMAL | Status: DC
  Administered 2014-07-15: 21 mg via TRANSDERMAL
  Filled 2014-07-15 (×2): qty 1

## 2014-07-15 MED ORDER — LISINOPRIL 40 MG PO TABS
40.0000 mg | ORAL_TABLET | Freq: Every day | ORAL | Status: DC
  Administered 2014-07-16: 40 mg via ORAL
  Filled 2014-07-15: qty 2
  Filled 2014-07-15 (×2): qty 1

## 2014-07-15 MED ORDER — ONDANSETRON HCL 4 MG/2ML IJ SOLN
INTRAMUSCULAR | Status: DC | PRN
  Administered 2014-07-15: 4 mg via INTRAVENOUS

## 2014-07-15 MED ORDER — LACTATED RINGERS IV SOLN
INTRAVENOUS | Status: DC | PRN
  Administered 2014-07-15 (×2): via INTRAVENOUS

## 2014-07-15 MED ORDER — ALBUTEROL SULFATE HFA 108 (90 BASE) MCG/ACT IN AERS: 2.0000 | INHALATION_SPRAY | Freq: Four times a day (QID) | RESPIRATORY_TRACT | Status: DC | PRN

## 2014-07-15 MED ORDER — GLYCOPYRROLATE 0.2 MG/ML IJ SOLN
INTRAMUSCULAR | Status: DC | PRN
  Administered 2014-07-15: 0.2 mg via INTRAVENOUS

## 2014-07-15 MED ORDER — OXYCODONE HCL 5 MG/5ML PO SOLN: 5.0000 mg | Freq: Once | ORAL | Status: DC | PRN

## 2014-07-15 MED ORDER — HYDROMORPHONE HCL PF 1 MG/ML IJ SOLN
INTRAMUSCULAR | Status: AC
  Filled 2014-07-15: qty 1

## 2014-07-15 MED ORDER — METHYLENE BLUE 1 % INJ SOLN
INTRAMUSCULAR | Status: AC
  Filled 2014-07-15: qty 10

## 2014-07-15 MED ORDER — ALBUTEROL SULFATE (2.5 MG/3ML) 0.083% IN NEBU: 2.5000 mg | INHALATION_SOLUTION | Freq: Four times a day (QID) | RESPIRATORY_TRACT | Status: DC | PRN

## 2014-07-15 MED ORDER — OXYCODONE-ACETAMINOPHEN 5-325 MG PO TABS
1.0000 | ORAL_TABLET | ORAL | Status: DC | PRN
  Administered 2014-07-16: 1 via ORAL
  Filled 2014-07-15: qty 1

## 2014-07-15 MED ORDER — HYDROMORPHONE HCL PF 1 MG/ML IJ SOLN: 0.2500 mg | INTRAMUSCULAR | Status: DC | PRN

## 2014-07-15 MED ORDER — OXYCODONE HCL 5 MG PO TABS: 5.0000 mg | ORAL_TABLET | Freq: Once | ORAL | Status: DC | PRN

## 2014-07-15 MED ORDER — PROPOFOL 10 MG/ML IV BOLUS
INTRAVENOUS | Status: DC | PRN
  Administered 2014-07-15: 120 mg via INTRAVENOUS

## 2014-07-15 MED ORDER — MORPHINE SULFATE 4 MG/ML IJ SOLN
4.0000 mg | INTRAMUSCULAR | Status: DC | PRN
  Administered 2014-07-15: 4 mg via INTRAVENOUS
  Filled 2014-07-15: qty 1

## 2014-07-15 MED ORDER — PHENYLEPHRINE HCL 10 MG/ML IJ SOLN
INTRAMUSCULAR | Status: DC | PRN
  Administered 2014-07-15 (×3): 80 ug via INTRAVENOUS
  Administered 2014-07-15: 40 ug via INTRAVENOUS

## 2014-07-15 MED ORDER — ONDANSETRON HCL 4 MG PO TABS: 4.0000 mg | ORAL_TABLET | Freq: Four times a day (QID) | ORAL | Status: DC | PRN

## 2014-07-15 MED ORDER — PROPOFOL 10 MG/ML IV BOLUS
INTRAVENOUS | Status: AC
  Filled 2014-07-15: qty 20

## 2014-07-15 MED ORDER — FENTANYL CITRATE 0.05 MG/ML IJ SOLN
INTRAMUSCULAR | Status: AC
  Filled 2014-07-15: qty 2

## 2014-07-15 MED ORDER — ROCURONIUM BROMIDE 50 MG/5ML IV SOLN
INTRAVENOUS | Status: AC
  Filled 2014-07-15: qty 1

## 2014-07-15 MED ORDER — MIDAZOLAM HCL 2 MG/2ML IJ SOLN
2.0000 mg | Freq: Once | INTRAMUSCULAR | Status: AC
  Administered 2014-07-15: 2 mg via INTRAVENOUS

## 2014-07-15 MED ORDER — DILTIAZEM HCL ER 180 MG PO CP24
180.0000 mg | ORAL_CAPSULE | Freq: Every day | ORAL | Status: DC
  Administered 2014-07-16: 180 mg via ORAL
  Filled 2014-07-15: qty 1

## 2014-07-15 MED ORDER — FENTANYL CITRATE 0.05 MG/ML IJ SOLN
INTRAMUSCULAR | Status: AC
  Filled 2014-07-15: qty 5

## 2014-07-15 MED ORDER — PROMETHAZINE HCL 25 MG/ML IJ SOLN: 6.2500 mg | INTRAMUSCULAR | Status: DC | PRN

## 2014-07-15 MED ORDER — ALPRAZOLAM 0.5 MG PO TABS: 1.0000 mg | ORAL_TABLET | Freq: Three times a day (TID) | ORAL | Status: DC | PRN

## 2014-07-15 MED ORDER — SODIUM CHLORIDE 0.9 % IJ SOLN
INTRAMUSCULAR | Status: DC | PRN
  Administered 2014-07-15: 16:00:00

## 2014-07-15 MED ORDER — MIDAZOLAM HCL 2 MG/2ML IJ SOLN
INTRAMUSCULAR | Status: AC
Start: 2014-07-15 — End: 2014-07-15
  Administered 2014-07-15: 2 mg via INTRAVENOUS
  Filled 2014-07-15: qty 2

## 2014-07-15 MED ORDER — FENTANYL CITRATE 0.05 MG/ML IJ SOLN
INTRAMUSCULAR | Status: DC | PRN
  Administered 2014-07-15: 50 ug via INTRAVENOUS
  Administered 2014-07-15 (×2): 25 ug via INTRAVENOUS
  Administered 2014-07-15: 50 ug via INTRAVENOUS
  Administered 2014-07-15 (×2): 25 ug via INTRAVENOUS

## 2014-07-15 MED ORDER — MEPERIDINE HCL 25 MG/ML IJ SOLN: 6.2500 mg | INTRAMUSCULAR | Status: DC | PRN

## 2014-07-15 MED ORDER — SUCCINYLCHOLINE CHLORIDE 20 MG/ML IJ SOLN
INTRAMUSCULAR | Status: AC
  Filled 2014-07-15: qty 1

## 2014-07-15 MED ORDER — HEPARIN SODIUM (PORCINE) 5000 UNIT/ML IJ SOLN
5000.0000 [IU] | Freq: Three times a day (TID) | INTRAMUSCULAR | Status: DC
  Administered 2014-07-16: 5000 [IU] via SUBCUTANEOUS
  Filled 2014-07-15 (×3): qty 1

## 2014-07-15 MED ORDER — LEVOTHYROXINE SODIUM 112 MCG PO TABS
112.0000 ug | ORAL_TABLET | Freq: Every day | ORAL | Status: DC
  Administered 2014-07-16: 112 ug via ORAL
  Filled 2014-07-15 (×2): qty 1

## 2014-07-15 MED ORDER — HYPROMELLOSE (GONIOSCOPIC) 2.5 % OP SOLN: 1.0000 [drp] | Freq: Three times a day (TID) | OPHTHALMIC | Status: DC | PRN

## 2014-07-15 MED ORDER — LIDOCAINE HCL (CARDIAC) 20 MG/ML IV SOLN
INTRAVENOUS | Status: AC
  Filled 2014-07-15: qty 5

## 2014-07-15 MED ORDER — SODIUM CHLORIDE 0.9 % IJ SOLN
INTRAMUSCULAR | Status: AC
  Filled 2014-07-15: qty 10

## 2014-07-15 MED ORDER — ONDANSETRON HCL 4 MG/2ML IJ SOLN
4.0000 mg | Freq: Four times a day (QID) | INTRAMUSCULAR | Status: DC | PRN
Start: 2014-07-15 — End: 2014-07-16

## 2014-07-15 MED ORDER — TECHNETIUM TC 99M SULFUR COLLOID FILTERED: 1.0000 | Freq: Once | INTRAVENOUS | Status: AC | PRN

## 2014-07-15 SURGICAL SUPPLY — 63 items
ADH SKN CLS APL DERMABOND .7 (GAUZE/BANDAGES/DRESSINGS)
APPLIER CLIP 9.375 MED OPEN (MISCELLANEOUS) ×3
APR CLP MED 9.3 20 MLT OPN (MISCELLANEOUS) ×1
BINDER BREAST LRG (GAUZE/BANDAGES/DRESSINGS) ×2 IMPLANT
BINDER BREAST XLRG (GAUZE/BANDAGES/DRESSINGS) IMPLANT
CANISTER SUCTION 2500CC (MISCELLANEOUS) ×3 IMPLANT
CHLORAPREP W/TINT 26ML (MISCELLANEOUS) ×3 IMPLANT
CLIP APPLIE 9.375 MED OPEN (MISCELLANEOUS) ×1 IMPLANT
CONT SPEC 4OZ CLIKSEAL STRL BL (MISCELLANEOUS) ×7 IMPLANT
COVER PROBE W GEL 5X96 (DRAPES) ×3 IMPLANT
COVER SURGICAL LIGHT HANDLE (MISCELLANEOUS) ×3 IMPLANT
DERMABOND ADVANCED (GAUZE/BANDAGES/DRESSINGS)
DERMABOND ADVANCED .7 DNX12 (GAUZE/BANDAGES/DRESSINGS) ×1 IMPLANT
DEVICE DISSECT PLASMABLAD 3.0S (MISCELLANEOUS) IMPLANT
DRAIN CHANNEL 19F RND (DRAIN) ×5 IMPLANT
DRAPE LAPAROSCOPIC ABDOMINAL (DRAPES) ×3 IMPLANT
DRAPE UTILITY 15X26 W/TAPE STR (DRAPE) ×6 IMPLANT
DRSG PAD ABDOMINAL 8X10 ST (GAUZE/BANDAGES/DRESSINGS) ×3 IMPLANT
ELECT CAUTERY BLADE 6.4 (BLADE) ×3 IMPLANT
ELECT REM PT RETURN 9FT ADLT (ELECTROSURGICAL) ×3
ELECTRODE REM PT RTRN 9FT ADLT (ELECTROSURGICAL) ×1 IMPLANT
EVACUATOR SILICONE 100CC (DRAIN) ×5 IMPLANT
GAUZE SPONGE 4X4 12PLY STRL (GAUZE/BANDAGES/DRESSINGS) ×3 IMPLANT
GAUZE XEROFORM 5X9 LF (GAUZE/BANDAGES/DRESSINGS) ×3 IMPLANT
GLOVE BIO SURGEON STRL SZ7.5 (GLOVE) ×3 IMPLANT
GLOVE BIOGEL PI IND STRL 6.5 (GLOVE) IMPLANT
GLOVE BIOGEL PI IND STRL 7.0 (GLOVE) IMPLANT
GLOVE BIOGEL PI IND STRL 7.5 (GLOVE) IMPLANT
GLOVE BIOGEL PI INDICATOR 6.5 (GLOVE) ×2
GLOVE BIOGEL PI INDICATOR 7.0 (GLOVE) ×2
GLOVE BIOGEL PI INDICATOR 7.5 (GLOVE) ×2
GLOVE SS BIOGEL STRL SZ 6.5 (GLOVE) IMPLANT
GLOVE SUPERSENSE BIOGEL SZ 6.5 (GLOVE) ×2
GLOVE SURG SS PI 6.5 STRL IVOR (GLOVE) ×2 IMPLANT
GOWN STRL REUS W/ TWL LRG LVL3 (GOWN DISPOSABLE) ×2 IMPLANT
GOWN STRL REUS W/TWL LRG LVL3 (GOWN DISPOSABLE) ×9
KIT BASIN OR (CUSTOM PROCEDURE TRAY) ×3 IMPLANT
KIT ROOM TURNOVER OR (KITS) ×3 IMPLANT
METHYLENE BLUE ×2 IMPLANT
NDL 18GX1X1/2 (RX/OR ONLY) (NEEDLE) ×1 IMPLANT
NDL HYPO 25GX1X1/2 BEV (NEEDLE) ×1 IMPLANT
NEEDLE 18GX1X1/2 (RX/OR ONLY) (NEEDLE) ×3 IMPLANT
NEEDLE HYPO 25GX1X1/2 BEV (NEEDLE) ×3 IMPLANT
NS IRRIG 1000ML POUR BTL (IV SOLUTION) ×3 IMPLANT
PACK GENERAL/GYN (CUSTOM PROCEDURE TRAY) ×3 IMPLANT
PAD ARMBOARD 7.5X6 YLW CONV (MISCELLANEOUS) ×3 IMPLANT
PLASMABLADE 3.0S (MISCELLANEOUS) ×3
SHEARS HARMONIC 9CM CVD (BLADE) IMPLANT
SPECIMEN JAR LG PLASTIC EMPTY (MISCELLANEOUS) ×2 IMPLANT
SPECIMEN JAR X LARGE (MISCELLANEOUS) ×1 IMPLANT
SPONGE GAUZE 4X4 12PLY STER LF (GAUZE/BANDAGES/DRESSINGS) ×2 IMPLANT
STAPLER VISISTAT 35W (STAPLE) ×2 IMPLANT
SUT ETHILON 3 0 FSL (SUTURE) ×3 IMPLANT
SUT MON AB 4-0 PC3 18 (SUTURE) ×3 IMPLANT
SUT VIC AB 3-0 54X BRD REEL (SUTURE) ×1 IMPLANT
SUT VIC AB 3-0 BRD 54 (SUTURE) ×3
SUT VIC AB 3-0 SH 18 (SUTURE) ×3 IMPLANT
SYR CONTROL 10ML LL (SYRINGE) ×3 IMPLANT
TOWEL OR 17X24 6PK STRL BLUE (TOWEL DISPOSABLE) ×3 IMPLANT
TOWEL OR 17X26 10 PK STRL BLUE (TOWEL DISPOSABLE) ×3 IMPLANT
TUBE CONNECTING 20'X1/4 (TUBING) ×1
TUBE CONNECTING 20X1/4 (TUBING) ×1 IMPLANT
WATER STERILE IRR 1000ML POUR (IV SOLUTION) IMPLANT

## 2014-07-15 NOTE — H&P (Signed)
[name]  06/11/2014 3:30 PM   Office Visit  MRN:  [id number]   Description: 73 year old female  Provider: Merrie Roof, MD  Department: [facility]         Diagnoses      Breast cancer of upper-inner quadrant of right female breast    -  Primary      ICD-9-CM: 174.2 ICD-10-CM: C50.211      Bloody stools          ICD-9-CM: 578.1 ICD-10-CM: K92.1                Current Vitals Most recent update: 06/11/2014  3:46 PM by Ivor Costa, CMA      BP Pulse Temp(Src) Resp Ht Wt      116/74 66 97.3 F (36.3 C) (Temporal) 16 _0  (1.702 m) 153 lb 9.6 oz (69.673 kg)      BMI              24.05 kg/m2                     Progress Notes      Merrie Roof, MD at 06/11/2014  4:28 PM      Status: Signed            Patient ID: [name], female   DOB: [birth year], 73 y.o.   MRN: [id number]   No chief complaint on file.     HPI [name] is a 73 y.o. female.  We are asked to see the patient in consultation by Dr. Redge Gainer to evaluate her for a right-sided breast cancer. The patient is a 73 year old white female who first noticed a lump in the upper inner right breast about 3 months ago. She denies any pain in her breasts. She denies any discharge from her nipple. Her nipple on that side has been inverted since a lumpectomy done in 2002 for benign disease. She underwent ultrasound which did show a mass in this location which was involving the skin and measured up to 3 cm in diameter. 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 She also notes blood in her stool with intermittent diarrhea over the last 2 months. She denies any abdominal pain. Her weight has been stable. She denies any nausea and vomiting. She does have family history significant for a sister with breast cancer.  HPI    Past Medical History   Diagnosis  Date   .  Depression     .  Hypertension     .   Glaucoma     .  Anxiety     .  Transient ischemic attack     .  Hypothyroidism     .  Glaucoma     .  Hypothyroidism     .  Right ankle sprain         hx of   .  H/O partial mastectomy         2001 or 2002         Past Surgical History   Procedure  Laterality  Date   .  Carotid endarterectomy    10/2001       Left, by Dr. Deon Pilling.   .  Laparoscopic cholecystectomy w/ cholangiography    03/2010       With liver biopsy. Dr. Marlou Starks.   Marland Kitchen  Left brest biopsy other    2002       Benign   .  Surgery for glaucoma, cataract, other             Family History   Problem  Relation  Age of Onset   .  Breast cancer  Sister         in 2002   .  Glaucoma           surgery for glaucoma and cataract   .  Coronary artery disease  Brother         Negative history of    .  Drug abuse  Brother     .  Hypertension  Mother     .  Hypothyroidism  Mother     .  Hypertension  Father          Social History History   Substance Use Topics   .  Smoking status:  Current Every Day Smoker -- 2.00 packs/day for 50 years       Types:  Cigarettes   .  Smokeless tobacco:  Not on file   .  Alcohol Use:  No         Allergies   Allergen  Reactions   .  Lactose Intolerance (Gi)           Current Outpatient Prescriptions   Medication  Sig  Dispense  Refill   .  albuterol (PROAIR HFA) 108 (90 BASE) MCG/ACT inhaler  Inhale 2 puffs into the lungs every 6 (six) hours as needed for wheezing or shortness of breath.   1 Inhaler   1   .  ALPRAZolam (XANAX) 1 MG tablet  Take 1 tablet (1 mg total) by mouth 3 (three) times daily as needed for sleep.   90 tablet   2   .  aspirin (ASPIR-81) 81 MG EC tablet  Take 81 mg by mouth daily.           Marland Kitchen  diltiazem (DILACOR XR) 180 MG 24 hr capsule  Take 1 capsule (180 mg total) by mouth daily.   30 capsule   5   .  fluticasone (FLOVENT HFA) 44 MCG/ACT inhaler  Inhale 1 puff into the lungs 2 (two) times daily.   1 Inhaler   11   .  levothyroxine (SYNTHROID,  LEVOTHROID) 112 MCG tablet  TAKE (1) TABLET BY MOUTH ONCE DAILY.   30 tablet   5   .  lisinopril (PRINIVIL,ZESTRIL) 40 MG tablet  Take 1 tablet (40 mg total) by mouth daily.   30 tablet   2       No current facility-administered medications for this visit.        Review of Systems Review of Systems  Constitutional: Negative.   HENT: Negative.   Eyes: Negative.   Respiratory: Negative.   Cardiovascular: Negative.   Gastrointestinal: Positive for diarrhea and blood in stool.  Endocrine: Negative.   Genitourinary: Negative.   Musculoskeletal: Negative.   Skin: Negative.   Allergic/Immunologic: Negative.   Neurological: Negative.   Hematological: Negative.   Psychiatric/Behavioral: Negative.       Blood pressure 116/74, pulse 66, temperature 97.3 F (36.3 C), temperature source Temporal, resp. rate 16, height _0  (1.702 m), weight 153 lb 9.6 oz (69.673 kg).   Physical Exam Physical Exam  Constitutional: She is oriented to person, place, and time. She appears well-developed and well-nourished.  HENT:   Head: Normocephalic and atraumatic.  Eyes: Conjunctivae and EOM are normal. Pupils are equal, round, and reactive to light.  Neck: Normal range of motion. Neck supple.  Cardiovascular: Normal rate, regular rhythm and normal heart sounds.   Pulmonary/Chest: Effort normal and breath sounds normal.  There is a mass in the upper inner right breast approximately 3 cm in diameter that is invading the skin with a nodule in the skin. There is no redness associated with it. There is no palpable mass in the left breast. There is no palpable axillary, supraclavicular, or cervical lymphadenopathy.  Abdominal: Soft. Bowel sounds are normal. She exhibits no mass. There is no tenderness.  Musculoskeletal: Normal range of motion.  Lymphadenopathy:    She has no cervical adenopathy.  Neurological: She is alert and oriented to person, place, and time.  Skin: Skin is warm and dry.   Psychiatric: She has a normal mood and affect. Her behavior is normal.      Data Reviewed As above   Assessment    The patient appears to have a 3 cm invasive cancer in the upper inner right breast that is invading the skin. I've talked her in detail about the different options for treatment but given these findings I think she might benefit from neoadjuvant chemotherapy. If she were to require surgery today she would need a right mastectomy and sentinel node evaluation. I will plan to refer her to a medical oncologist to talk about the possibility of neoadjuvant therapy. She would like to be treated by medical oncology in [place] at [facility] and we will make that referral. A blue she also requires a GI evaluation given the blood in her stool and diarrhea. We will refer her to [facility] gastroenterology.     Plan    Plan for referrals to medical oncology and [facility] gastroenterology. If she agrees to neoadjuvant chemotherapy then she will probably require a Port-A-Cath. I discussed with her in detail the risks and benefits of the operation to place a Port-A-Cath as well as some of the technical aspects and she understands and wishes to proceed. If she decides on surgery first and then she will require a right mastectomy with sentinel node biopsy. I've discussed this with her as well including the risks and benefits of the surgery as well as some of the technical aspects and she understands.

## 2014-07-15 NOTE — Anesthesia Procedure Notes (Signed)
Procedure Name: LMA Insertion Date/Time: 07/15/2014 3:50 PM Performed by: Maeola Harman Pre-anesthesia Checklist: Patient identified, Emergency Drugs available, Suction available, Patient being monitored and Timeout performed Patient Re-evaluated:Patient Re-evaluated prior to inductionOxygen Delivery Method: Circle system utilized Preoxygenation: Pre-oxygenation with 100% oxygen Intubation Type: IV induction Ventilation: Mask ventilation without difficulty LMA: LMA inserted LMA Size: 4.0 Number of attempts: 1 Placement Confirmation: positive ETCO2 Tube secured with: Tape Dental Injury: Teeth and Oropharynx as per pre-operative assessment

## 2014-07-15 NOTE — Anesthesia Preprocedure Evaluation (Addendum)
Anesthesia Evaluation  Patient identified by MRN, date of birth, ID band Patient awake    Reviewed: Allergy & Precautions, H&P , NPO status , Patient's Chart, lab work & pertinent test results, reviewed documented beta blocker date and time   Airway Mallampati: II TM Distance: >3 FB Neck ROM: Full    Dental no notable dental hx. (+) Edentulous Lower, Edentulous Upper   Pulmonary Current Smoker,  breath sounds clear to auscultation  Pulmonary exam normal       Cardiovascular hypertension, Pt. on medications Rhythm:Regular Rate:Normal     Neuro/Psych PSYCHIATRIC DISORDERS Anxiety Depression CVA, Residual Symptoms    GI/Hepatic negative GI ROS, Neg liver ROS,   Endo/Other  Type 2Hypothyroidism   Renal/GU negative Renal ROS     Musculoskeletal negative musculoskeletal ROS (+)   Abdominal (+)  Abdomen: soft. Bowel sounds: normal.  Peds  Hematology negative hematology ROS (+)   Anesthesia Other Findings   Reproductive/Obstetrics negative OB ROS                        Anesthesia Physical Anesthesia Plan  ASA: III  Anesthesia Plan: General   Post-op Pain Management:    Induction: Intravenous  Airway Management Planned: LMA  Additional Equipment:   Intra-op Plan:   Post-operative Plan: Extubation in OR  Informed Consent: I have reviewed the patients History and Physical, chart, labs and discussed the procedure including the risks, benefits and alternatives for the proposed anesthesia with the patient or authorized representative who has indicated his/her understanding and acceptance.   Dental advisory given  Plan Discussed with: CRNA  Anesthesia Plan Comments:        Anesthesia Quick Evaluation

## 2014-07-15 NOTE — Interval H&P Note (Signed)
History and Physical Interval Note:  07/15/2014 3:24 PM  [name]  has presented today for surgery, with the diagnosis of right breast cancer  The various methods of treatment have been discussed with the patient and family. After consideration of risks, benefits and other options for treatment, the patient has consented to  Procedure(s): [place] (Right) as a surgical intervention .  The patient's history has been reviewed, patient examined, no change in status, stable for surgery.  I have reviewed the patient's chart and labs.  Questions were answered to the patient's satisfaction.     [name],PAUL S

## 2014-07-15 NOTE — Op Note (Addendum)
07/15/2014  5:34 PM  PATIENT:  [name]  73 y.o. female  PRE-OPERATIVE DIAGNOSIS:  right breast cancer  POST-OPERATIVE DIAGNOSIS:  right breast cancer  PROCEDURE:  Procedure(s): RIGHT MASTECTOMY AND SENTINEL NODE MAPPING WITH BIOPSY (Right) Right axillary lymph node dissection Right modified radical mastectomy  SURGEON:  Surgeon(s) and Role:    * Merrie Roof, MD - Primary  PHYSICIAN ASSISTANT:   ASSISTANTS: none   ANESTHESIA:   general  EBL:  Total I/O In: 1000 [I.V.:1000] Out: 50 [Blood:50]  BLOOD ADMINISTERED:none  DRAINS: (2 ) Jackson-Pratt drain(s) with closed bulb suction in the prepectoral space   LOCAL MEDICATIONS USED:  NONE  SPECIMEN:  Source of Specimen:  right breast and axillary contents  DISPOSITION OF SPECIMEN:  PATHOLOGY  COUNTS:  YES  TOURNIQUET:  * No tourniquets in log *  DICTATION: .Dragon Dictation After informed consent was obtained the patient was brought to the operating room and placed in the supine position on the operating table. After adequate induction of general anesthesia the patient's right chest and breast and axillary areas were prepped with ChloraPrep, dried, and draped in usual sterile manner. An elliptical incision was made around the nipple and areolar complex in order to get around the tumor. This incision was carried through the skin and subcutaneous tissue sharply with the plasma blade. Breast hooks were then used to elevate the skin flaps anteriorly towards the ceiling. Then skin flaps were then created between the breast tissue and subcutaneous fat. This dissection was carried circumferentially until the dissection reached the chest wall muscles. Laterally the neoprobe was used to identify a hot spot in the right axilla.  Blunt dissection was carried out in this area until a lymph node was identified. This lymph node was excised sharply with the plasma blade. This was sent for touch preps which were positive for breast  cancer. The breast was then removed from the chest wall with the pectoralis fascia. Once the breast was removed it was oriented with a short stitch on the superior surface and a long stitch on the lateral surface. The breast was then sent to pathology for further evaluation. Next the latissimus muscle laterally, the serratus muscle medially, and the axillary veins superiorly were identified. The contents within the boundaries was then removed by blunt right angle dissection. Several small vessels were controlled with clips. Thus a rectal dorsal and long thoracic neurovascular complexes were identified and spared. The specimen was sent as right axillary contents. The wound was then irrigated with copious amounts of saline. The wound is examined and found to be hemostatic. 2 small stab incisions were made in the anterior axillary line beneath the operative area. A tonsil clamp was placed through each of these openings and used to bring a 19 [place] round Blake drain into the operative bed. The lateral drain was placed in the axilla and the medial drain was probed along the chest wall. The superior and inferior skin flaps were then grossly reapproximated with interrupted 3-0 Vicryl stitches. The skin was then closed with staples. The drains were placed to bulb suction and there was a good seal. Sterile dressings were applied. The patient tolerated the procedure well. At the end of the case all needle sponge and instrument counts were correct. The patient was then awakened and taken to recovery in stable condition.   PLAN OF CARE: Admit for overnight observation  PATIENT DISPOSITION:  PACU - hemodynamically stable.   Delay start of Pharmacological VTE agent (>  24hrs) due to surgical blood loss or risk of bleeding: no

## 2014-07-15 NOTE — Transfer of Care (Signed)
Immediate Anesthesia Transfer of Care Note  Patient: [name]  Procedure(s) Performed: Procedure(s): RIGHT MASTECTOMY AND SENTINEL NODE MAPPING WITH BIOPSY (Right)  Patient Location: PACU  Anesthesia Type:General  Level of Consciousness: awake, alert  and oriented  Airway & Oxygen Therapy: Patient Spontanous Breathing  Post-op Assessment: Report given to PACU RN  Post vital signs: stable  Complications: No apparent anesthesia complications

## 2014-07-16 DIAGNOSIS — C50919 Malignant neoplasm of unspecified site of unspecified female breast: Secondary | ICD-10-CM | POA: Diagnosis not present

## 2014-07-16 NOTE — Anesthesia Postprocedure Evaluation (Signed)
  Anesthesia Post-op Note  Patient: [name]  Procedure(s) Performed: Procedure(s): RIGHT MASTECTOMY AND SENTINEL NODE MAPPING WITH BIOPSY (Right)  Patient Location: PACU  Anesthesia Type:General  Level of Consciousness: awake and alert   Airway and Oxygen Therapy: Patient Spontanous Breathing  Post-op Pain: mild  Post-op Assessment: Post-op Vital signs reviewed, Patient's Cardiovascular Status Stable and Respiratory Function Stable  Post-op Vital Signs: Reviewed  Filed Vitals:   07/16/14 0524  BP: 136/58  Pulse: 65  Temp: 36.4 C  Resp: 18    Complications: No apparent anesthesia complications

## 2014-07-16 NOTE — Progress Notes (Signed)
Pt demonstrated ability to manage her JP drains. Discharged by wheelchair accompanied by family members.

## 2014-07-16 NOTE — Progress Notes (Signed)
1 Day Post-Op  Subjective: No complaints  Objective: Vital signs in last 24 hours: Temp:  [97.3 F (36.3 C)-97.9 F (36.6 C)] 97.6 F (36.4 C) (08/21 0524) Pulse Rate:  [46-76] 65 (08/21 0524) Resp:  [11-28] 18 (08/21 0524) BP: (116-152)/(40-97) 136/58 mmHg (08/21 0524) SpO2:  [93 %-100 %] 94 % (08/21 0524) Weight:  [153 lb (69.4 kg)-174 lb 6.4 oz (79.107 kg)] 174 lb 6.4 oz (79.107 kg) (08/20 1950) Last BM Date: 07/13/14  Intake/Output from previous day: 08/20 0701 - 08/21 0700 In: 1677 [I.V.:1677] Out: 619 [Urine:300; Drains:269; Blood:50] Intake/Output this shift:    Resp: clear to auscultation bilaterally Chest wall: skin flaps look good Cardio: regular rate and rhythm GI: soft, non-tender; bowel sounds normal; no masses,  no organomegaly  Lab Results:   Recent Labs  07/14/14 0958  WBC 8.9  HGB 14.9  HCT 42.8  PLT 254   BMET  Recent Labs  07/14/14 0958  NA 141  K 3.8  CL 101  CO2 26  GLUCOSE 84  BUN 7  CREATININE 0.84  CALCIUM 9.9   PT/INR No results found for this basename: LABPROT, INR,  in the last 72 hours ABG No results found for this basename: PHART, PCO2, PO2, HCO3,  in the last 72 hours  Studies/Results: Dg Chest 2 View  07/14/2014   CLINICAL DATA:  Breast carcinoma  EXAM: CHEST  2 VIEW  COMPARISON:  January 26, 2010  FINDINGS: There is a nodular opacity in the medial aspect of the posterior segment of the right upper lobe measuring 3.5 x 2.1 x 2.6 cm. There is a smaller nodular opacity in the right mid lung region measuring 1.3 x 1.0 cm, seen only on the frontal view. No other parenchymal lung nodule/mass lesions identified. There is no edema or consolidation. There is pleural plaque with calcification along the lateral right hemithorax. Heart size and pulmonary vascularity are normal. No adenopathy. No bone lesions.  IMPRESSION: Nodular opacity in the posterior segment of the right upper lobe measuring 3.5 x 2.1 x 2.6 cm. More subtle nodular  opacity in the right mid lung measuring 1.3 x 1.0 cm. Neoplastic involvement in these areas must be of concern. Chest CT, ideally with intravenous contrast, advised to further evaluate. No edema or consolidation.  These results will be called to the ordering clinician or representative by the Radiologist Assistant, and communication documented in the PACS or zVision Dashboard.   Electronically Signed   By: Lowella Grip M.D.   On: 07/14/2014 12:26   Nm Sentinel Node Inj-no Rpt (breast)  07/15/2014   CLINICAL DATA: right breast cancer   Sulfur colloid was injected intradermally by the nuclear medicine  technologist for breast cancer sentinel node localization.     Anti-infectives: Anti-infectives   Start     Dose/Rate Route Frequency Ordered Stop   07/15/14 0600  ceFAZolin (ANCEF) IVPB 2 g/50 mL premix     2 g 100 mL/hr over 30 Minutes Intravenous On call to O.R. 07/14/14 1422 07/15/14 1550      Assessment/Plan: [name]/p Procedure([name]): RIGHT MASTECTOMY AND SENTINEL NODE MAPPING WITH BIOPSY (Right) Discharge  LOS: 1 day    [name],[name] 07/16/2014

## 2014-07-17 NOTE — Discharge Summary (Signed)
Physician Discharge Summary  Patient ID: [name] MRN: [id number] DOB/AGE: 05/14/1941 73 y.o.  Admit date: 07/15/2014 Discharge date: 07/17/2014  Admission Diagnoses:  Discharge Diagnoses:  Active Problems:   Breast cancer, female   Discharged Condition: good  Hospital Course: the pt underwent right mastectomy and axillary lymph node dissection(modified radical mastectomy). She tolerated surgery well. On pod 1 she was ready for discharge home  Consults: None  Significant Diagnostic Studies: none  Treatments: surgery: as above  Discharge Exam: Blood pressure 135/59, pulse 65, temperature 97.6 F (36.4 C), temperature source Oral, resp. rate 18, height 5\' 4"  (1.626 m), weight 174 lb 6.4 oz (79.107 kg), SpO2 94.00%. Resp: clear to auscultation bilaterally Chest wall: skin flaps look good Cardio: regular rate and rhythm GI: soft, non-tender; bowel sounds normal; no masses,  no organomegaly  Disposition: 01-Home or Self Care  Discharge Instructions   Call MD for:  difficulty breathing, headache or visual disturbances    Complete by:  As directed      Call MD for:  extreme fatigue    Complete by:  As directed      Call MD for:  hives    Complete by:  As directed      Call MD for:  persistant dizziness or light-headedness    Complete by:  As directed      Call MD for:  persistant nausea and vomiting    Complete by:  As directed      Call MD for:  redness, tenderness, or signs of infection (pain, swelling, redness, odor or green/yellow discharge around incision site)    Complete by:  As directed      Call MD for:  severe uncontrolled pain    Complete by:  As directed      Call MD for:  temperature >100.4    Complete by:  As directed      Diet - low sodium heart healthy    Complete by:  As directed      Discharge instructions    Complete by:  As directed   Sponge bathe while drains are in. No overhead activity. Empty drains, record output, and recharge bulb twice a day      Increase activity slowly    Complete by:  As directed      No wound care    Complete by:  As directed             Medication List         albuterol 108 (90 BASE) MCG/ACT inhaler  Commonly known as:  PROVENTIL HFA;VENTOLIN HFA  Inhale 2 puffs into the lungs every 6 (six) hours as needed for wheezing or shortness of breath.     ALPRAZolam 1 MG tablet  Commonly known as:  XANAX  Take 1 tablet (1 mg total) by mouth 3 (three) times daily as needed for sleep.     ASPIR-81 81 MG EC tablet  Generic drug:  aspirin  Take 81 mg by mouth daily.     cholecalciferol 1000 UNITS tablet  Commonly known as:  VITAMIN D  Take 1,000 Units by mouth daily.     ciprofloxacin 500 MG tablet  Commonly known as:  CIPRO  Take 1 tablet (500 mg total) by mouth 2 (two) times daily.     diltiazem 180 MG 24 hr capsule  Commonly known as:  DILACOR XR  Take 1 capsule (180 mg total) by mouth daily.     hydroxypropyl methylcellulose 2.5 %  ophthalmic solution  Commonly known as:  ISOPTO TEARS  Place 1 drop into both eyes 3 (three) times daily as needed for dry eyes.     levothyroxine 112 MCG tablet  Commonly known as:  SYNTHROID, LEVOTHROID  Take 112 mcg by mouth daily before breakfast.     lisinopril 40 MG tablet  Commonly known as:  PRINIVIL,ZESTRIL  Take 1 tablet (40 mg total) by mouth daily.           Follow-up Information   Follow up with Merrie Roof, MD In 1 week.   Specialty:  General Surgery   Contact information:   [address] [place] [address] [phone]       Signed: Merrie Roof 07/17/2014, 2:25 AM

## 2014-07-20 ENCOUNTER — Encounter (HOSPITAL_COMMUNITY): Payer: Self-pay | Admitting: General Surgery

## 2014-07-22 ENCOUNTER — Encounter (INDEPENDENT_AMBULATORY_CARE_PROVIDER_SITE_OTHER): Payer: Self-pay | Admitting: General Surgery

## 2014-07-22 ENCOUNTER — Ambulatory Visit (INDEPENDENT_AMBULATORY_CARE_PROVIDER_SITE_OTHER): Payer: Medicare Other | Admitting: General Surgery

## 2014-07-22 VITALS — BP 122/76 | HR 67 | Temp 98.3°F | Ht 67.0 in | Wt 155.0 lb

## 2014-07-22 DIAGNOSIS — C50211 Malignant neoplasm of upper-inner quadrant of right female breast: Secondary | ICD-10-CM

## 2014-07-22 DIAGNOSIS — C50219 Malignant neoplasm of upper-inner quadrant of unspecified female breast: Secondary | ICD-10-CM

## 2014-07-22 NOTE — Progress Notes (Signed)
Subjective:     Patient ID: [name], female   DOB: [birth year], 73 y.o.   MRN: [id number]  HPI The patient is a 73 year old white female who is one-week status post right modified radical mastectomy For a T2 N1 A. Breast cancer. She has tolerated the surgery well. She has no complaints today. Drain #1 is putting out about 5-10 cc of fluid a day. During #2 is putting out 45-60 cc a day.  Review of Systems     Objective:   Physical Exam On exam her right mastectomy incision is healing nicely. She does have a slight bit of redness at the incision which looks like a reaction to the metal staples. The lateral drain was removed today without difficulty and she tolerated this well. The medial drain was left in place. About half of the staples were removed and she also tolerated this well    Assessment:     The patient is one-week status post right modified radical mastectomy for breast cancer     Plan:     At this point she will continue to record the output from the remaining drain. I will plan to see her back in one week to check her progress. Her preoperative chest x-ray also showed a 3 cm nodule on one of her lungs. We will defer this back to her medical oncologist for workup.

## 2014-07-22 NOTE — Patient Instructions (Signed)
Sponge bathe until drains are out Record output from remaining drain No overhead activity

## 2014-07-29 ENCOUNTER — Encounter (INDEPENDENT_AMBULATORY_CARE_PROVIDER_SITE_OTHER): Payer: Medicare Other | Admitting: General Surgery

## 2014-08-04 ENCOUNTER — Ambulatory Visit: Payer: Medicare Other

## 2014-08-04 ENCOUNTER — Other Ambulatory Visit (HOSPITAL_COMMUNITY): Payer: Self-pay | Admitting: Internal Medicine

## 2014-08-04 ENCOUNTER — Other Ambulatory Visit: Payer: Medicare Other

## 2014-08-04 DIAGNOSIS — R918 Other nonspecific abnormal finding of lung field: Secondary | ICD-10-CM

## 2014-08-11 ENCOUNTER — Ambulatory Visit (HOSPITAL_COMMUNITY)
Admission: RE | Admit: 2014-08-11 | Discharge: 2014-08-11 | Disposition: A | Discharge status: Home / Self Care | Payer: Medicare Other | Source: Ambulatory Visit | Attending: Internal Medicine | Admitting: Internal Medicine

## 2014-08-11 ENCOUNTER — Encounter (HOSPITAL_COMMUNITY): Payer: Self-pay

## 2014-08-11 DIAGNOSIS — R918 Other nonspecific abnormal finding of lung field: Secondary | ICD-10-CM

## 2014-08-11 DIAGNOSIS — R222 Localized swelling, mass and lump, trunk: Secondary | ICD-10-CM | POA: Diagnosis not present

## 2014-08-11 LAB — GLUCOSE, CAPILLARY: Glucose-Capillary: 81 mg/dL (ref 70–99)

## 2014-08-11 MED ORDER — FLUDEOXYGLUCOSE F - 18 (FDG) INJECTION: 7.2000 | Freq: Once | INTRAVENOUS | Status: AC | PRN

## 2014-08-18 ENCOUNTER — Ambulatory Visit (INDEPENDENT_AMBULATORY_CARE_PROVIDER_SITE_OTHER): Payer: Medicare Other | Admitting: Nurse Practitioner

## 2014-08-18 ENCOUNTER — Encounter: Payer: Self-pay | Admitting: Nurse Practitioner

## 2014-08-18 VITALS — BP 129/69 | HR 73 | Temp 97.4°F | Ht 67.0 in | Wt 150.0 lb

## 2014-08-18 DIAGNOSIS — R5381 Other malaise: Secondary | ICD-10-CM

## 2014-08-18 DIAGNOSIS — E559 Vitamin D deficiency, unspecified: Secondary | ICD-10-CM

## 2014-08-18 DIAGNOSIS — F329 Major depressive disorder, single episode, unspecified: Secondary | ICD-10-CM

## 2014-08-18 DIAGNOSIS — F3289 Other specified depressive episodes: Secondary | ICD-10-CM

## 2014-08-18 DIAGNOSIS — F411 Generalized anxiety disorder: Secondary | ICD-10-CM

## 2014-08-18 DIAGNOSIS — H409 Unspecified glaucoma: Secondary | ICD-10-CM

## 2014-08-18 DIAGNOSIS — R5383 Other fatigue: Secondary | ICD-10-CM

## 2014-08-18 DIAGNOSIS — I10 Essential (primary) hypertension: Secondary | ICD-10-CM

## 2014-08-18 MED ORDER — ALPRAZOLAM 1 MG PO TABS: 1.0000 mg | ORAL_TABLET | Freq: Three times a day (TID) | ORAL | Status: DC | PRN

## 2014-08-18 MED ORDER — DILTIAZEM HCL ER 180 MG PO CP24: 180.0000 mg | ORAL_CAPSULE | Freq: Every day | ORAL | Status: DC

## 2014-08-18 MED ORDER — LISINOPRIL 40 MG PO TABS: 40.0000 mg | ORAL_TABLET | Freq: Every day | ORAL | Status: DC

## 2014-08-18 NOTE — Progress Notes (Signed)
Subjective:    Patient ID: [name], female    DOB: 06-Jan-1941, 73 y.o.   MRN: [id number]  Patient here today for follow up of chronic medical problems- she is c/o fatigue. -patient had a right total mastectomy with lymph node removal on august 20 th.     Hypertension This is a chronic problem. The current episode started more than 1 year ago. The problem is unchanged. The problem is controlled. Pertinent negatives include no anxiety, blurred vision, chest pain, headaches, neck pain, palpitations, peripheral edema, PND or shortness of breath. There are no associated agents to hypertension. Risk factors for coronary artery disease include family history and post-menopausal state. Past treatments include ACE inhibitors and beta blockers. The current treatment provides significant improvement. There are no compliance problems.  Hypertensive end-organ damage includes CVA (TIA) and a thyroid problem.  Thyroid Problem Presents for follow-up (hyprothyroidism) visit. Patient reports no anxiety, cold intolerance, constipation, depressed mood, diaphoresis, diarrhea, dry skin, hair loss, heat intolerance, leg swelling, palpitations, tremors, visual change, weight gain or weight loss. The symptoms have been stable.  Smoker occassional use of flovent when she starts wheezing. GAD/depression Only thing she is taking is xanax $Remove'1mg'[id number]$ - takes at least 1 x a day  *Patient had a PET scan done on 08/11/14 and has appointment with her oncologist and surgeon coming up.   Review of Systems  Constitutional: Negative for weight loss, weight gain and diaphoresis.  Eyes: Negative for blurred vision.  Respiratory: Negative for shortness of breath.   Cardiovascular: Negative for chest pain, palpitations and PND.  Gastrointestinal: Negative for diarrhea and constipation.  Endocrine: Negative for cold intolerance and heat intolerance.  Musculoskeletal: Negative for neck pain.  Neurological: Negative for tremors and  headaches.       Objective:   Physical Exam  Constitutional: She is oriented to person, place, and time. She appears well-developed and well-nourished.  HENT:  Nose: Nose normal.  Mouth/Throat: Oropharynx is clear and moist.  Eyes: EOM are normal.  Neck: Trachea normal, normal range of motion and full passive range of motion without pain. Neck supple. No JVD present. Carotid bruit is not present. No thyromegaly present.  Cardiovascular: Normal rate, regular rhythm, normal heart sounds and intact distal pulses.  Exam reveals no gallop and no friction rub.   No murmur heard. Pulmonary/Chest: Effort normal and breath sounds normal. Right breast exhibits no inverted nipple, no nipple discharge, no skin change and no tenderness. Left breast exhibits no inverted nipple, no mass, no nipple discharge, no skin change and no tenderness.  Right breast mastectomy with lymph node removal.   Abdominal: Soft. Bowel sounds are normal. She exhibits no distension and no mass. There is no tenderness.  Musculoskeletal: Normal range of motion.  Lymphadenopathy:    She has no cervical adenopathy.  Neurological: She is alert and oriented to person, place, and time. She has normal reflexes.  Skin: Skin is warm and dry.  Psychiatric: She has a normal mood and affect. Her behavior is normal. Judgment and thought content normal.   BP 129/69  Pulse 73  Temp(Src) 97.4 F (36.3 C) (Oral)  Ht $R'5\' 7"'qK$  (1.702 m)  Wt 150 lb (68.04 kg)  BMI 23.49 kg/m2         Assessment & Plan:  1. ANXIETY   2. DEPRESSION  3. GLAUCOMA NOS  4. HYPERTENSION  - CMP14+EGFR - NMR, lipoprofile  5. VITAMIN D DEFICIENCY  6. Generalized anxiety disorder - ALPRAZolam (XANAX) 1 MG tablet;  Take 1 tablet (1 mg total) by mouth 3 (three) times daily as needed for sleep.  Dispense: 90 tablet; Refill: 2  7. Essential hypertension - lisinopril (PRINIVIL,ZESTRIL) 40 MG tablet; Take 1 tablet (40 mg total) by mouth daily.  Dispense:  30 tablet; Refill: 5 - diltiazem (DILACOR XR) 180 MG 24 hr capsule; Take 1 capsule (180 mg total) by mouth daily.  Dispense: 30 capsule; Refill: 5  8. Fatigue Thyroid and anemia panel    Labs pending Health maintenance reviewed Diet and exercise encouraged Continue all meds Follow up  In 3 months    [place], FNP

## 2014-08-18 NOTE — Patient Instructions (Signed)

## 2014-08-19 LAB — ANEMIA PROFILE B
BASOS: 0 %
Basophils Absolute: 0 10*3/uL (ref 0.0–0.2)
EOS ABS: 0.1 10*3/uL (ref 0.0–0.4)
Eos: 1 %
Ferritin: 54 ng/mL (ref 15–150)
Folate: 7.7 ng/mL (ref >3.0)
HCT: 40.7 % (ref 34.0–46.6)
HEMOGLOBIN: 14.3 g/dL (ref 11.1–15.9)
IMMATURE GRANS (ABS): 0 10*3/uL (ref 0.0–0.1)
IRON: 76 ug/dL (ref 35–155)
Immature Granulocytes: 0 %
Iron Saturation: 24 % (ref 15–55)
Lymphocytes Absolute: 3 10*3/uL (ref 0.7–3.1)
Lymphs: 42 %
MCH: 32 pg (ref 26.6–33.0)
MCHC: 35.1 g/dL (ref 31.5–35.7)
MCV: 91 fL (ref 79–97)
Monocytes Absolute: 0.4 10*3/uL (ref 0.1–0.9)
Monocytes: 6 %
NEUTROS ABS: 3.6 10*3/uL (ref 1.4–7.0)
NEUTROS PCT: 51 %
Platelets: 248 10*3/uL (ref 150–379)
RBC: 4.47 x10E6/uL (ref 3.77–5.28)
RDW: 13.4 % (ref 12.3–15.4)
Retic Ct Pct: 0.8 % (ref 0.6–2.6)
TIBC: 322 ug/dL (ref 250–450)
UIBC: 246 ug/dL (ref 150–375)
VITAMIN B 12: 1558 pg/mL — AB (ref 211–946)
WBC: 7.1 10*3/uL (ref 3.4–10.8)

## 2014-08-19 LAB — NMR, LIPOPROFILE
Cholesterol: 124 mg/dL (ref 100–199)
HDL Cholesterol by NMR: 75 mg/dL (ref >39)
HDL PARTICLE NUMBER: 31.5 umol/L (ref >=30.5)
LDL PARTICLE NUMBER: 400 nmol/L (ref <1000)
LDL SIZE: 20.9 nm (ref >20.5)
LDLC SERPL CALC-MCNC: 39 mg/dL (ref 0–99)
LP-IR Score: <25 (ref <=45)
TRIGLYCERIDES BY NMR: 48 mg/dL (ref 0–149)

## 2014-08-19 LAB — THYROID PANEL WITH TSH
Free Thyroxine Index: 3.5 (ref 1.2–4.9)
T3 UPTAKE RATIO: 26 % (ref 24–39)
T4, Total: 13.6 ug/dL — ABNORMAL HIGH (ref 4.5–12.0)
TSH: 0.28 u[IU]/mL — ABNORMAL LOW (ref 0.450–4.500)

## 2014-08-19 LAB — CMP14+EGFR
A/G RATIO: 1.7 (ref 1.1–2.5)
ALBUMIN: 4.1 g/dL (ref 3.5–4.8)
ALT: 8 IU/L (ref 0–32)
AST: 17 IU/L (ref 0–40)
Alkaline Phosphatase: 84 IU/L (ref 39–117)
BILIRUBIN TOTAL: 0.4 mg/dL (ref 0.0–1.2)
BUN/Creatinine Ratio: 8 — ABNORMAL LOW (ref 11–26)
BUN: 6 mg/dL — AB (ref 8–27)
CO2: 24 mmol/L (ref 18–29)
Calcium: 9.5 mg/dL (ref 8.7–10.3)
Chloride: 99 mmol/L (ref 97–108)
Creatinine, Ser: 0.8 mg/dL (ref 0.57–1.00)
GFR, EST AFRICAN AMERICAN: 85 mL/min/{1.73_m2} (ref >59)
GFR, EST NON AFRICAN AMERICAN: 74 mL/min/{1.73_m2} (ref >59)
GLUCOSE: 84 mg/dL (ref 65–99)
Globulin, Total: 2.4 g/dL (ref 1.5–4.5)
Potassium: 3.7 mmol/L (ref 3.5–5.2)
Sodium: 142 mmol/L (ref 134–144)
Total Protein: 6.5 g/dL (ref 6.0–8.5)

## 2014-08-23 ENCOUNTER — Other Ambulatory Visit (HOSPITAL_COMMUNITY): Payer: Self-pay | Admitting: Internal Medicine

## 2014-08-23 DIAGNOSIS — C50919 Malignant neoplasm of unspecified site of unspecified female breast: Secondary | ICD-10-CM

## 2014-08-23 DIAGNOSIS — R918 Other nonspecific abnormal finding of lung field: Secondary | ICD-10-CM

## 2014-08-24 ENCOUNTER — Other Ambulatory Visit: Payer: Self-pay | Admitting: Radiology

## 2014-08-25 ENCOUNTER — Ambulatory Visit (HOSPITAL_COMMUNITY)
Admission: RE | Admit: 2014-08-25 | Discharge: 2014-08-25 | Disposition: A | Discharge status: Home / Self Care | Payer: Medicare Other | Source: Ambulatory Visit | Attending: Internal Medicine | Admitting: Internal Medicine

## 2014-08-25 ENCOUNTER — Ambulatory Visit (HOSPITAL_COMMUNITY): Payer: Medicare Other

## 2014-08-25 ENCOUNTER — Other Ambulatory Visit (HOSPITAL_COMMUNITY): Payer: Self-pay | Admitting: Internal Medicine

## 2014-08-25 DIAGNOSIS — C50919 Malignant neoplasm of unspecified site of unspecified female breast: Secondary | ICD-10-CM

## 2014-08-25 DIAGNOSIS — K769 Liver disease, unspecified: Secondary | ICD-10-CM | POA: Insufficient documentation

## 2014-08-25 DIAGNOSIS — R918 Other nonspecific abnormal finding of lung field: Secondary | ICD-10-CM

## 2014-08-25 LAB — CBC
HCT: 43 % (ref 36.0–46.0)
HEMOGLOBIN: 14.3 g/dL (ref 12.0–15.0)
MCH: 31.6 pg (ref 26.0–34.0)
MCHC: 33.3 g/dL (ref 30.0–36.0)
MCV: 95.1 fL (ref 78.0–100.0)
Platelets: 249 10*3/uL (ref 150–400)
RBC: 4.52 MIL/uL (ref 3.87–5.11)
RDW: 13.1 % (ref 11.5–15.5)
WBC: 7.2 10*3/uL (ref 4.0–10.5)

## 2014-08-25 LAB — PROTIME-INR
INR: 0.93 (ref 0.00–1.49)
Prothrombin Time: 12.5 seconds (ref 11.6–15.2)

## 2014-08-25 LAB — APTT: aPTT: 27 seconds (ref 24–37)

## 2014-08-25 MED ORDER — MIDAZOLAM HCL 2 MG/2ML IJ SOLN
INTRAMUSCULAR | Status: AC
  Filled 2014-08-25: qty 2

## 2014-08-25 MED ORDER — FENTANYL CITRATE 0.05 MG/ML IJ SOLN
INTRAMUSCULAR | Status: AC
  Filled 2014-08-25: qty 2

## 2014-08-25 MED ORDER — SODIUM CHLORIDE 0.9 % IV SOLN: Freq: Once | INTRAVENOUS | Status: DC

## 2014-08-25 MED ORDER — LIDOCAINE HCL (PF) 1 % IJ SOLN
INTRAMUSCULAR | Status: AC
  Filled 2014-08-25: qty 10

## 2014-08-25 NOTE — H&P (Signed)
Chief Complaint: "I'm having a liver biopsy"   Referring Physician(s): Darovsky,Boris M  History of Present Illness: [name] is a 73 y.o. 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 She presents today for US guided liver lesion biopsy.  Past Medical History  Diagnosis Date  . Depression   . Hypertension   . Glaucoma   . Transient ischemic attack   . Hypothyroidism   . Glaucoma   . Hypothyroidism   . Right ankle sprain     hx of  . H/O partial mastectomy     2001 or 2002  . Stroke 02    tia  . H/O wheezing     with uri  . Anxiety     ptsd    Past Surgical History  Procedure Laterality Date  . Carotid endarterectomy  10/2001    Left, by Dr. Deon Pilling.  . Laparoscopic cholecystectomy w/ cholangiography  03/2010    With liver biopsy. Dr. Marlou Starks.  . Left brest biopsy other  2002    Benign  partial mastectomy  . Eye surgery Bilateral 11    cataracts  . Surgery for glaucoma, cataract, other      no glaucoma  . Cholecystectomy    . Simple mastectomy with axillary sentinel node biopsy Right 07/15/2014    Procedure: RIGHT MASTECTOMY AND SENTINEL NODE MAPPING WITH BIOPSY;  Surgeon: Merrie Roof, MD;  Location: [place];  Service: General;  Laterality: Right;    Allergies: Lactose intolerance (gi)  Medications: Prior to Admission medications   Medication Sig Start Date End Date Taking? Authorizing Provider  albuterol (PROVENTIL HFA;VENTOLIN HFA) 108 (90 BASE) MCG/ACT inhaler Inhale 2 puffs into the lungs every 6 (six) hours as needed for wheezing or shortness of breath.    Historical Provider, MD  ALPRAZolam Duanne Moron) 1 MG tablet Take 1 tablet (1 mg total) by mouth 3 (three) times daily as needed for sleep. 08/18/14   Mary-Margaret Hassell Done, FNP  aspirin (ASPIR-81) 81 MG EC tablet Take 81 mg by mouth daily.      Historical Provider, MD  cholecalciferol (VITAMIN  D) 1000 UNITS tablet Take 1,000 Units by mouth daily.    Historical Provider, MD  diltiazem (DILACOR XR) 180 MG 24 hr capsule Take 1 capsule (180 mg total) by mouth daily. 08/18/14   Mary-Margaret Hassell Done, FNP  hydroxypropyl methylcellulose (ISOPTO TEARS) 2.5 % ophthalmic solution Place 1 drop into both eyes 3 (three) times daily as needed for dry eyes.    Historical Provider, MD  levothyroxine (SYNTHROID, LEVOTHROID) 112 MCG tablet Take 112 mcg by mouth daily before breakfast.    Historical Provider, MD  lisinopril (PRINIVIL,ZESTRIL) 40 MG tablet Take 1 tablet (40 mg total) by mouth daily. 08/18/14   Mary-Margaret Hassell Done, FNP    Family History  Problem Relation Age of Onset  . Breast cancer Sister     in 2002  . Glaucoma      surgery for glaucoma and cataract  . Coronary artery disease Brother     Negative history of   . Drug abuse Brother   . Hypertension Mother   . Hypothyroidism Mother   . Hypertension Father     History   Social History  . Marital Status: Single    Spouse Name: N/A    Number of Children: N/A  . Years of Education: N/A   Social History Main Topics  . Smoking status:  Current Every Day Smoker -- 1.50 packs/day for 50 years    Types: Cigarettes  . Smokeless tobacco: Not on file  . Alcohol Use: Yes     Comment: occ wine  . Drug Use: No  . Sexual Activity: Not on file   Other Topics Concern  . Not on file   Social History Narrative   Retired - previously an Conservation officer, nature   Lives with her son and his family which is stressful   Current smoker 1.5ppd            Public librarian approved for 85% discount, after Medicare pays, for  [facility] only not eligible for [facility] card per [employer] August 18, 2010 11:02AM         Review of Systems  Constitutional: Positive for fatigue and unexpected weight change. Negative for fever and chills.  Respiratory: Positive for shortness of breath. Negative for cough.   Cardiovascular: Negative for  chest pain.  Gastrointestinal: Negative for nausea, vomiting, abdominal pain and blood in stool.  Genitourinary: Negative for dysuria and hematuria.  Musculoskeletal:       Some intermittent pain in right upper/post chest region  Neurological: Positive for headaches.  Hematological: Does not bruise/bleed easily.  Psychiatric/Behavioral: The patient is nervous/anxious.     Vital Signs: BP 120/62  Pulse 69  Temp(Src) 97.8 F (36.6 C) (Oral)  Resp 20  Ht 5\' 7"  (1.702 m)  Wt 150 lb (68.04 kg)  BMI 23.49 kg/m2  SpO2 99%  Physical Exam  Constitutional: She is oriented to person, place, and time. She appears well-developed and well-nourished.  Cardiovascular: Normal rate and regular rhythm.   Pulmonary/Chest: Effort normal and breath sounds normal.  Abdominal: Soft. Bowel sounds are normal. There is no tenderness.  Musculoskeletal: Normal range of motion.  Trace LE edema  Neurological: She is alert and oriented to person, place, and time.    Imaging: Nm Pet Image Initial (pi) Skull Base To Thigh  08/11/2014   CLINICAL DATA:  Initial treatment strategy for lung mass.  EXAM: NUCLEAR MEDICINE PET SKULL BASE TO THIGH  TECHNIQUE: 7.2 mCi F-18 FDG was injected intravenously. Full-ring PET imaging was performed from the skull base to thigh after the radiotracer. CT data was obtained and used for attenuation correction and anatomic localization.  FASTING BLOOD GLUCOSE:  Value: 81 mg/dl  COMPARISON:  None.  FINDINGS: NECK  Tiny less than 1 cm hypermetabolic left level 2 cervical lymph nodes are seen with SUV max measuring 3.7. No other hypermetabolic cervical lymph nodes identified.  CHEST  A 3 cm spiculated mass in the medial right upper lobe is hypermetabolic with SUV max measuring 25.6. A satellite 8 mm nodule in the posterior right upper lobe is hypermetabolic, with SUV max of 5.4 (image 70) a third 1.5 cm pulmonary nodule in the right middle lobe on image 92 is hypermetabolic with SUV max of  14.2. These are suspicious for synchronous bronchogenic carcinomas.  Hypermetabolic lymphadenopathy is seen in the right paratracheal and subcarinal regions, with SUV max of 25.0. No definite hypermetabolic hilar lymph nodes identified.  Hypermetabolic activity is seen around the periphery of a large fluid collection measuring approximately 4 x 12 cm in the right anterior chest wall, at site of previous right mastectomy and axillary lymph node dissection. This is suspicious for a postop lymphocele.  ABDOMEN/PELVIS  At least 1 hypermetabolic lesion is seen in the peripheral anterior aspect of the liver which has an SUV max of 9.4. No  other focal hypermetabolic liver lesions are seen. No other hypermetabolic soft tissue masses or lymphadenopathy identified within the abdomen or pelvis.  SKELETON  Multiple hypermetabolic bone metastases are seen involving the right iliac crest, cervical and thoracic spine, sternum, and several right ribs.  IMPRESSION: 3 cm hypermetabolic right upper lobe mass and 2 other smaller hypermetabolic nodules in the right upper and middle lobes. These are suspicious for synchronous bronchogenic carcinomas, with metastatic disease considered less likely.  Metastatic lymphadenopathy in the mediastinum and left neck.  At least 1 hypermetabolic liver metastasis in the anterior subcapsular region. Consider abdomen MRI without and with contrast for further evaluation if clinically warranted.  Hypermetabolic bone metastases involving the spine, sternum, right ribs, and right iliac crest.  Right anterior chest wall fluid collection at site of previous right mastectomy and axillary lymph node dissection, suspicious for postop lymphocele.   Electronically Signed   By: Earle Gell M.D.   On: 08/11/2014 16:22    Labs: Lab Results  Component Value Date   WBC 7.2 08/25/2014   HCT 43.0 08/25/2014   MCV 95.1 08/25/2014   PLT 249 08/25/2014   NA 142 08/18/2014   K 3.7 08/18/2014   CL 99 08/18/2014   CO2  24 08/18/2014   GLUCOSE 84 08/18/2014   BUN 6* 08/18/2014   CREATININE 0.80 08/18/2014   CALCIUM 9.5 08/18/2014   PROT 6.5 08/18/2014   ALBUMIN 4.7 05/15/2013   AST 17 08/18/2014   ALT 8 08/18/2014   ALKPHOS 84 08/18/2014   BILITOT 0.4 08/18/2014   GFRNONAA 74 08/18/2014   GFRAA 85 08/18/2014    Assessment and Plan: [name] is a 73 y.o. 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 She presents today for US guided liver lesion biopsy. Details/risks of procedure d/w pt with her understanding and consent.          Signed: Autumn Messing 08/25/2014, 1:23 PM

## 2014-08-25 NOTE — Sedation Documentation (Signed)
MD at bedside to discuss procedure. Dr Earleen Newport canceled procedure in ultrasound today.  See MD notes for further evaluation.  Pt discharged from ultrasound.

## 2014-08-27 ENCOUNTER — Ambulatory Visit (INDEPENDENT_AMBULATORY_CARE_PROVIDER_SITE_OTHER): Payer: Medicare Other | Admitting: Internal Medicine

## 2014-08-27 ENCOUNTER — Encounter: Payer: Self-pay | Admitting: Internal Medicine

## 2014-08-27 VITALS — BP 108/60 | HR 68 | Temp 97.8°F | Ht 67.0 in | Wt 151.0 lb

## 2014-08-27 DIAGNOSIS — R918 Other nonspecific abnormal finding of lung field: Secondary | ICD-10-CM

## 2014-08-27 MED ORDER — VARENICLINE TARTRATE 0.5 MG X 11 & 1 MG X 42 PO MISC: ORAL | Status: DC

## 2014-08-27 NOTE — Patient Instructions (Addendum)
I will call you with recommendations after I review your xrays and pet scan with radiology   In meantime, my main recommendation is The key is to stop smoking completely before smoking completely stops you - this will be very important going forward

## 2014-08-27 NOTE — Progress Notes (Signed)
   Subjective:    Patient ID: [name], female    DOB: [birth year]  MRN: [id number]  HPI  44 yowf active smoker referred by Dr Audree Camel for ? Need for fob   08/27/2014 1st [facility] office visit/ Aayana Reinertsen   Chief Complaint  Patient presents with  . Pulmonary Consult    Referred by Dr. Janae Sauce for eval of Lung Mass.    pt was dx'd with breast Ca with pos nodes in 07/15/14 and PET suggested met dz but ? Lung primary also. Not limited by breathing from desired activities  But not very active Has saba but not using, not really sure it helps with any symptoms   No   chronic cough or h/o hemoptysis or  cp or chest tightness, subjective wheeze overt sinus or hb symptoms. No unusual exp hx or h/o childhood pna/ asthma or knowledge of premature birth.  Sleeping ok without nocturnal  or early am exacerbation  of respiratory  c/o's or need for noct saba. Also denies any obvious fluctuation of symptoms with weather or environmental changes or other aggravating or alleviating factors except as outlined above   Current Medications, Allergies, Complete Past Medical History, Past Surgical History, Family History, and Social History were reviewed in [employer] record.             Review of Systems  Constitutional: Positive for appetite change and unexpected weight change. Negative for fever and chills.  HENT: Negative for congestion, dental problem, ear pain, nosebleeds, postnasal drip, rhinorrhea, sinus pressure, sneezing, sore throat, trouble swallowing and voice change.   Eyes: Negative for visual disturbance.  Respiratory: Negative for cough, choking and shortness of breath.   Cardiovascular: Negative for chest pain and leg swelling.  Gastrointestinal: Negative for vomiting, abdominal pain and diarrhea.  Genitourinary: Negative for difficulty urinating.  Musculoskeletal: Negative for arthralgias.  Skin: Negative for rash.  Neurological: Negative for  tremors, syncope and headaches.  Hematological: Does not bruise/bleed easily.       Objective:   Physical Exam Wt Readings from Last 3 Encounters:  08/27/14 151 lb (68.493 kg)  08/25/14 150 lb (68.04 kg)  08/18/14 150 lb (68.04 kg)      HEENT: full dentures/ nl turbinates, and orophanx. Nl external ear canals without cough reflex   NECK :  without JVD/Nodes/TM/ nl carotid upstrokes bilaterally   LUNGS: no acc muscle use, clear to A and P bilaterally without cough on insp or exp maneuvers   CV:  RRR  no s3 or murmur or increase in P2, no edema   ABD:  soft and nontender with nl excursion in the supine position. No bruits or organomegaly, bowel sounds nl  MS:  warm without deformities, calf tenderness, cyanosis or clubbing  SKIN: warm and dry without lesions    NEURO:  alert, approp, no deficits    PET 08/11/14  3 cm hypermetabolic right upper lobe mass and 2 other smaller  hypermetabolic nodules in the right upper and middle lobes. These  are suspicious for synchronous bronchogenic carcinomas, with  metastatic disease considered less likely.  Metastatic lymphadenopathy in the mediastinum and left neck.  At least 1 hypermetabolic liver metastasis in the anterior  subcapsular region. Consider abdomen MRI without and with contrast  for further evaluation if clinically warranted.  Hypermetabolic bone metastases involving the spine, sternum, right  ribs, and right iliac crest.        Assessment & Plan:

## 2014-08-28 DIAGNOSIS — R918 Other nonspecific abnormal finding of lung field: Secondary | ICD-10-CM | POA: Insufficient documentation

## 2014-08-28 NOTE — Assessment & Plan Note (Signed)
After review of all the pet results with radiology the best bet is to bx the lung mass directly as it is unlikely to be a met from breast and then refer to [place] with tissue dx to sort out whether to w/u the mets to see whether they are breast or lung related - none are easily accessible for tissue dx, however, per IR   FOB bx of the lung mass is difficult given how medial it is and would require gen anesthesia if choose to w/u the lymph nodes as a separate issue but they would more likely be from lung than breast based on overall clinical picture.

## 2014-08-30 ENCOUNTER — Other Ambulatory Visit: Payer: Self-pay | Admitting: Internal Medicine

## 2014-08-30 ENCOUNTER — Telehealth: Payer: Self-pay | Admitting: Internal Medicine

## 2014-08-30 DIAGNOSIS — R918 Other nonspecific abnormal finding of lung field: Secondary | ICD-10-CM

## 2014-08-30 NOTE — Telephone Encounter (Signed)
Called spoke with pt daughter Davy Pique. She reports MW was suppose to call and speak with pathologists and then give them a call.  Pt is scheduled to oncology Wednesday, Dr. Shawnee Knapp over in [place] center. Please advise MW thanks

## 2014-08-30 NOTE — Telephone Encounter (Signed)
Discussed at length with IR and daughter, best approach is go ahead with CT directed lung bx as the nodes and bones are really not good targets

## 2014-08-31 ENCOUNTER — Telehealth: Payer: Self-pay | Admitting: Nurse Practitioner

## 2014-09-01 ENCOUNTER — Other Ambulatory Visit: Payer: Self-pay | Admitting: Radiology

## 2014-09-01 ENCOUNTER — Encounter (HOSPITAL_COMMUNITY): Payer: Self-pay

## 2014-09-02 ENCOUNTER — Other Ambulatory Visit: Payer: Self-pay | Admitting: Radiology

## 2014-09-03 ENCOUNTER — Encounter: Payer: Self-pay | Admitting: Internal Medicine

## 2014-09-03 ENCOUNTER — Ambulatory Visit (HOSPITAL_COMMUNITY)
Admission: RE | Admit: 2014-09-03 | Discharge: 2014-09-03 | Disposition: A | Discharge status: Home / Self Care | Payer: Medicare Other | Source: Ambulatory Visit | Attending: Internal Medicine | Admitting: Internal Medicine

## 2014-09-03 VITALS — BP 120/70 | HR 59 | Temp 97.7°F | Resp 18 | Ht 67.0 in | Wt 150.0 lb

## 2014-09-03 DIAGNOSIS — C3411 Malignant neoplasm of upper lobe, right bronchus or lung: Secondary | ICD-10-CM | POA: Diagnosis not present

## 2014-09-03 DIAGNOSIS — R911 Solitary pulmonary nodule: Secondary | ICD-10-CM

## 2014-09-03 DIAGNOSIS — R918 Other nonspecific abnormal finding of lung field: Secondary | ICD-10-CM | POA: Insufficient documentation

## 2014-09-03 LAB — CBC WITH DIFFERENTIAL/PLATELET
Basophils Absolute: 0 10*3/uL (ref 0.0–0.1)
Basophils Relative: 0 % (ref 0–1)
EOS ABS: 0.1 10*3/uL (ref 0.0–0.7)
Eosinophils Relative: 1 % (ref 0–5)
HEMATOCRIT: 40.5 % (ref 36.0–46.0)
HEMOGLOBIN: 13.8 g/dL (ref 12.0–15.0)
Lymphocytes Relative: 33 % (ref 12–46)
Lymphs Abs: 2.4 10*3/uL (ref 0.7–4.0)
MCH: 31.6 pg (ref 26.0–34.0)
MCHC: 34.1 g/dL (ref 30.0–36.0)
MCV: 92.7 fL (ref 78.0–100.0)
MONO ABS: 0.4 10*3/uL (ref 0.1–1.0)
MONOS PCT: 6 % (ref 3–12)
NEUTROS PCT: 60 % (ref 43–77)
Neutro Abs: 4.3 10*3/uL (ref 1.7–7.7)
Platelets: 241 10*3/uL (ref 150–400)
RBC: 4.37 MIL/uL (ref 3.87–5.11)
RDW: 13 % (ref 11.5–15.5)
WBC: 7.2 10*3/uL (ref 4.0–10.5)

## 2014-09-03 LAB — PROTIME-INR
INR: 2.44 — ABNORMAL HIGH (ref 0.00–1.49)
INR: 0.99 (ref 0.00–1.49)
Prothrombin Time: 26.5 seconds — ABNORMAL HIGH (ref 11.6–15.2)
Prothrombin Time: 13.1 seconds (ref 11.6–15.2)

## 2014-09-03 LAB — BASIC METABOLIC PANEL
Anion gap: 13 (ref 5–15)
BUN: 8 mg/dL (ref 6–23)
CHLORIDE: 100 meq/L (ref 96–112)
CO2: 27 mEq/L (ref 19–32)
CREATININE: 0.79 mg/dL (ref 0.50–1.10)
Calcium: 9.4 mg/dL (ref 8.4–10.5)
GFR calc non Af Amer: 81 mL/min — ABNORMAL LOW (ref >90)
Glucose, Bld: 90 mg/dL (ref 70–99)
Potassium: 3.6 mEq/L — ABNORMAL LOW (ref 3.7–5.3)
Sodium: 140 mEq/L (ref 137–147)

## 2014-09-03 LAB — APTT
aPTT: 31 seconds (ref 24–37)
aPTT: 30 seconds (ref 24–37)

## 2014-09-03 MED ORDER — LIDOCAINE HCL (PF) 1 % IJ SOLN
INTRAMUSCULAR | Status: AC
  Filled 2014-09-03: qty 30

## 2014-09-03 MED ORDER — LIDOCAINE HCL 1 % IJ SOLN
INTRAMUSCULAR | Status: AC
  Filled 2014-09-03: qty 20

## 2014-09-03 MED ORDER — MIDAZOLAM HCL 2 MG/2ML IJ SOLN
INTRAMUSCULAR | Status: AC
  Filled 2014-09-03: qty 4

## 2014-09-03 MED ORDER — SODIUM CHLORIDE 0.9 % IV SOLN
INTRAVENOUS | Status: DC
  Administered 2014-09-03: 09:00:00 via INTRAVENOUS

## 2014-09-03 MED ORDER — FENTANYL CITRATE 0.05 MG/ML IJ SOLN
INTRAMUSCULAR | Status: AC
  Filled 2014-09-03: qty 4

## 2014-09-03 NOTE — H&P (Signed)
Chief Complaint: "I'm here now for a lung biopsy"  Referring Physician(s): Wert,Michael B  History of Present Illness: [name] is a 73 y.o. 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 She presented to [facility] on 08/25/14 for OP liver lesion biopsy; however no lesion was identified on subsequent [place]. She now presents for CT guided RUL lung mass biopsy.   Past Medical History  Diagnosis Date  . Depression   . Hypertension   . Glaucoma   . Transient ischemic attack   . Hypothyroidism   . Glaucoma   . Hypothyroidism   . Right ankle sprain     hx of  . H/O partial mastectomy     2001 or 2002  . Stroke 02    tia  . H/O wheezing     with uri  . Anxiety     ptsd    Past Surgical History  Procedure Laterality Date  . Carotid endarterectomy  10/2001    Left, by Dr. Deon Pilling.  . Laparoscopic cholecystectomy w/ cholangiography  03/2010    With liver biopsy. Dr. Marlou Starks.  . Left brest biopsy other  2002    Benign  partial mastectomy  . Eye surgery Bilateral 11    cataracts  . Surgery for glaucoma, cataract, other      no glaucoma  . Cholecystectomy    . Simple mastectomy with axillary sentinel node biopsy Right 07/15/2014    Procedure: RIGHT MASTECTOMY AND SENTINEL NODE MAPPING WITH BIOPSY;  Surgeon: Merrie Roof, MD;  Location: [facility];  Service: General;  Laterality: Right;    Allergies: Lactose intolerance (gi)  Medications: Prior to Admission medications   Medication Sig Start Date End Date Taking? Authorizing Provider  albuterol (PROVENTIL HFA;VENTOLIN HFA) 108 (90 BASE) MCG/ACT inhaler Inhale 2 puffs into the lungs every 6 (six) hours as needed for wheezing or shortness of breath.   Yes Historical Provider, MD  ALPRAZolam Duanne Moron) 1 MG tablet Take 1 tablet (1 mg total) by mouth 3 (three) times daily as needed for sleep. 08/18/14  Yes Mary-Margaret Hassell Done, FNP    aspirin (ASPIR-81) 81 MG EC tablet Take 81 mg by mouth daily.     Yes Historical Provider, MD  cholecalciferol (VITAMIN D) 1000 UNITS tablet Take 3,000 Units by mouth daily.    Yes Historical Provider, MD  diltiazem (DILACOR XR) 180 MG 24 hr capsule Take 1 capsule (180 mg total) by mouth daily. 08/18/14  Yes Mary-Margaret Hassell Done, FNP  hydroxypropyl methylcellulose (ISOPTO TEARS) 2.5 % ophthalmic solution Place 1 drop into both eyes 3 (three) times daily as needed for dry eyes.   Yes Historical Provider, MD  levothyroxine (SYNTHROID, LEVOTHROID) 112 MCG tablet Take 112 mcg by mouth daily before breakfast.   Yes Historical Provider, MD  lisinopril (PRINIVIL,ZESTRIL) 40 MG tablet Take 1 tablet (40 mg total) by mouth daily. 08/18/14  Yes Mary-Margaret Hassell Done, FNP  varenicline (CHANTIX PAK) 0.5 MG X 11 & 1 MG X 42 tablet Take one 0.5 mg tablet by mouth once daily for 3 days, then increase to one 0.5 mg tablet twice daily for 4 days, then increase to one 1 mg tablet twice daily. 08/27/14   Tanda Rockers, MD    Family History  Problem Relation Age of Onset  . Breast cancer Sister     in 2002  . Glaucoma      surgery for glaucoma and  cataract  . Coronary artery disease Brother     Negative history of   . Drug abuse Brother   . Hypertension Mother   . Hypothyroidism Mother   . Hypertension Father   . COPD Daughter     smoker    History   Social History  . Marital Status: Single    Spouse Name: N/A    Number of Children: N/A  . Years of Education: N/A   Occupational History  . Retired    Social History Main Topics  . Smoking status: Current Every Day Smoker -- 1.50 packs/day for 50 years    Types: Cigarettes  . Smokeless tobacco: Never Used  . Alcohol Use: Yes     Comment: occ wine  . Drug Use: No  . Sexual Activity: Not on file   Other Topics Concern  . Not on file   Social History Narrative   Retired - previously an Conservation officer, nature   Lives with her son and his  family which is stressful   Current smoker 1.5ppd            Public librarian approved for 85% discount, after Medicare pays, for  [facility] only not eligible for [facility] card per [employer] August 18, 2010 11:02AM         Review of Systems   Constitutional: Positive for fatigue and unexpected weight change. Negative for fever and chills.  Respiratory:       Occ dyspnea with exertion  Cardiovascular: Negative for chest pain.  Gastrointestinal: Negative for nausea, vomiting, abdominal pain and blood in stool.  Genitourinary: Negative for dysuria and hematuria.  Musculoskeletal: Negative for back pain.       Occ discomfort rt upper/post chest region  Neurological:       Occ HA's  Hematological: Does not bruise/bleed easily.  Psychiatric/Behavioral: The patient is nervous/anxious.     Vital Signs: BP 107/63  Pulse 65  Temp(Src) 97.7 F (36.5 C) (Oral)  Resp 18  Ht 5\' 7"  (1.702 m)  Wt 150 lb (68.04 kg)  BMI 23.49 kg/m2  SpO2 100%  Physical Exam  Constitutional: She is oriented to person, place, and time. She appears well-developed and well-nourished.  Cardiovascular: Normal rate and regular rhythm.   Pulmonary/Chest: Effort normal and breath sounds normal.  Abdominal: Soft. Bowel sounds are normal. There is no tenderness.  Musculoskeletal: Normal range of motion.  Trace LE edema  Neurological: She is alert and oriented to person, place, and time.    Imaging: Nm Pet Image Initial (pi) Skull Base To Thigh  08/11/2014   CLINICAL DATA:  Initial treatment strategy for lung mass.  EXAM: NUCLEAR MEDICINE PET SKULL BASE TO THIGH  TECHNIQUE: 7.2 mCi F-18 FDG was injected intravenously. Full-ring PET imaging was performed from the skull base to thigh after the radiotracer. CT data was obtained and used for attenuation correction and anatomic localization.  FASTING BLOOD GLUCOSE:  Value: 81 mg/dl  COMPARISON:  None.  FINDINGS: NECK  Tiny less than 1 cm hypermetabolic left level 2  cervical lymph nodes are seen with SUV max measuring 3.7. No other hypermetabolic cervical lymph nodes identified.  CHEST  A 3 cm spiculated mass in the medial right upper lobe is hypermetabolic with SUV max measuring 25.6. A satellite 8 mm nodule in the posterior right upper lobe is hypermetabolic, with SUV max of 5.4 (image 70) a third 1.5 cm pulmonary nodule in the right middle lobe on image 92 is hypermetabolic with SUV  max of 14.2. These are suspicious for synchronous bronchogenic carcinomas.  Hypermetabolic lymphadenopathy is seen in the right paratracheal and subcarinal regions, with SUV max of 25.0. No definite hypermetabolic hilar lymph nodes identified.  Hypermetabolic activity is seen around the periphery of a large fluid collection measuring approximately 4 x 12 cm in the right anterior chest wall, at site of previous right mastectomy and axillary lymph node dissection. This is suspicious for a postop lymphocele.  ABDOMEN/PELVIS  At least 1 hypermetabolic lesion is seen in the peripheral anterior aspect of the liver which has an SUV max of 9.4. No other focal hypermetabolic liver lesions are seen. No other hypermetabolic soft tissue masses or lymphadenopathy identified within the abdomen or pelvis.  SKELETON  Multiple hypermetabolic bone metastases are seen involving the right iliac crest, cervical and thoracic spine, sternum, and several right ribs.  IMPRESSION: 3 cm hypermetabolic right upper lobe mass and 2 other smaller hypermetabolic nodules in the right upper and middle lobes. These are suspicious for synchronous bronchogenic carcinomas, with metastatic disease considered less likely.  Metastatic lymphadenopathy in the mediastinum and left neck.  At least 1 hypermetabolic liver metastasis in the anterior subcapsular region. Consider abdomen MRI without and with contrast for further evaluation if clinically warranted.  Hypermetabolic bone metastases involving the spine, sternum, right ribs, and  right iliac crest.  Right anterior chest wall fluid collection at site of previous right mastectomy and axillary lymph node dissection, suspicious for postop lymphocele.   Electronically Signed   By: Earle Gell M.D.   On: 08/11/2014 16:22   US Abdomen Limited  08/25/2014   CLINICAL DATA:  73 year old female referred for possible liver mass biopsy. The patient has a history of breast cancer, with PET CT showing suspicious lesion in the right liver lobe.  EXAM: US ABDOMEN LIMITED - RIGHT UPPER QUADRANT  COMPARISON:  PET-CT 08/11/2014  FINDINGS: Liver:  Grayscale and color ultrasound imaging of the liver was performed. The ultrasound was targeting a possible segment 8 lesion which was observed on the FDG/ fused images of recent PET-CT.  Thorough ultrasound survey of the right liver lobe was performed. Relatively uniform echogenicity of the liver was observed, with no identifiable lesion to target. Segment 8 was thoroughly interrogated, again with no observable lesion.  No percutaneous biopsy was attempted. Findings were discussed with both the patient's family, as well as the referring physician's office 08/25/2014.  IMPRESSION: Limited ultrasound survey of the liver demonstrates no identifiable target for percutaneous biopsy.  Signed,  Dulcy Fanny. Earleen Newport, DO  [facility]  [facility]   Electronically Signed   By: Corrie Mckusick O.D.   On: 08/25/2014 15:26    Labs:  CBC:  Recent Labs  07/14/14 0958 08/18/14 1524 08/25/14 1250 09/03/14 0905  WBC 8.9 7.1 7.2 7.2  HGB 14.9 14.3 14.3 13.8  HCT 42.8 40.7 43.0 40.5  PLT 254 248 249 241    COAGS:  Recent Labs  08/25/14 1250  INR 0.93  APTT 27    BMP:  Recent Labs  02/12/14 1514 05/17/14 1443 07/14/14 0958 08/18/14 1524  NA 143 141 141 142  K 4.0 4.2 3.8 3.7  CL 100 100 101 99  CO2 23 24 26 24   GLUCOSE 91 95 84 84  BUN 13 8 7  6*  CALCIUM 9.4 9.7 9.9 9.5  CREATININE 0.92 0.77 0.84 0.80    GFRNONAA 63 77 68* 74  GFRAA 72 89 79* 85  LIVER FUNCTION TESTS:  Recent Labs  11/13/13 1453 02/12/14 1514 05/17/14 1443 08/18/14 1524  BILITOT 0.3 0.3 0.3 0.4  AST 13 15 16 17   ALT 8 6 14 8   ALKPHOS 62 84 83 84  PROT 6.5 6.7 6.6 6.5   Results for orders placed during the hospital encounter of 09/03/14  APTT      Result Value Ref Range   aPTT 31  24 - 37 seconds  CBC WITH DIFFERENTIAL      Result Value Ref Range   WBC 7.2  4.0 - 10.5 K/uL   RBC 4.37  3.87 - 5.11 MIL/uL   Hemoglobin 13.8  12.0 - 15.0 g/dL   HCT 40.5  36.0 - 46.0 %   MCV 92.7  78.0 - 100.0 fL   MCH 31.6  26.0 - 34.0 pg   MCHC 34.1  30.0 - 36.0 g/dL   RDW 13.0  11.5 - 15.5 %   Platelets 241  150 - 400 K/uL   Neutrophils Relative % 60  43 - 77 %   Neutro Abs 4.3  1.7 - 7.7 K/uL   Lymphocytes Relative 33  12 - 46 %   Lymphs Abs 2.4  0.7 - 4.0 K/uL   Monocytes Relative 6  3 - 12 %   Monocytes Absolute 0.4  0.1 - 1.0 K/uL   Eosinophils Relative 1  0 - 5 %   Eosinophils Absolute 0.1  0.0 - 0.7 K/uL   Basophils Relative 0  0 - 1 %   Basophils Absolute 0.0  0.0 - 0.1 K/uL  PROTIME-INR      Result Value Ref Range   Prothrombin Time 26.5 (*) 11.6 - 15.2 seconds   INR 2.44 (*) 0.00 - 1.49     TUMOR MARKERS: No results found for this basename: AFPTM, CEA, CA199, CHROMGRNA,  in the last 8760 hours  Assessment and Plan: [name] is a 74 y.o. 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 She presented to [facility] on 08/25/14 for OP liver lesion biopsy; however no lesion was identified on subsequent [place]. She now presents for CT guided RUL lung mass biopsy. Details/risks of procedure d/w pt/family with their understanding and consent. Initial PT/INR abnormal this am; f/u at 1000 was 13.1/0.99.          Signed: Autumn Messing 09/03/2014, 9:39 AM

## 2014-09-03 NOTE — Discharge Instructions (Signed)
Needle Biopsy of Lung, Care After °Refer to this sheet in the next few weeks. These instructions provide you with information on caring for yourself after your procedure. Your health care provider may also give you more specific instructions. Your treatment has been planned according to current medical practices, but problems sometimes occur. Call your health care provider if you have any problems or questions after your procedure. °WHAT TO EXPECT AFTER THE PROCEDURE °· A bandage will be applied over the area where the needle was inserted. You may be asked to apply pressure to the bandage for several minutes to ensure there is minimal bleeding. °· In most cases, you can leave when your needle biopsy procedure is completed. Do not drive yourself home. Someone else should take you home. °· If you received an IV sedative or general anesthetic, you will be taken to a comfortable place to relax while the medicine wears off. °· If you have upcoming travel scheduled, talk to your health care provider about when it is safe to travel by air after the procedure. °HOME CARE INSTRUCTIONS °· Expect to take it easy for the rest of the day. °· Protect the area where you received the needle biopsy by keeping the bandage in place for as long as instructed. °· You may feel some mild pain or discomfort in the area, but this should stop in a day or two. °· Take medicines only as directed by your health care provider. °SEEK MEDICAL CARE IF:  °· You have pain at the biopsy site that worsens or is not helped by medicine. °· You have swelling or drainage at the needle biopsy site. °· You have a fever. °SEEK IMMEDIATE MEDICAL CARE IF:  °· You have new or worsening shortness of breath. °· You have chest pain. °· You are coughing up blood. °· You have bleeding that does not stop with pressure or a bandage. °· You develop light-headedness or fainting. °Document Released: 09/09/2007 Document Revised: 03/29/2014 Document Reviewed:  04/06/2013 °ExitCare® Patient Information ©2015 [employer]. This information is not intended to replace advice given to you by your health care provider. Make sure you discuss any questions you have with your health care provider. ° °

## 2014-09-03 NOTE — Sedation Documentation (Signed)
Procedure complete patient toleratedwell

## 2014-09-03 NOTE — Sedation Documentation (Signed)
Biopsy being performed, Patient tolerating well.

## 2014-09-03 NOTE — Procedures (Signed)
Successful RUL MASS 18G CORE BX NO COMP STABLE PATH PENDING FULL REPORT IN PACS

## 2014-09-03 NOTE — Sedation Documentation (Signed)
Patient is resting comfortably. 

## 2014-09-06 ENCOUNTER — Telehealth: Payer: Self-pay | Admitting: Nurse Practitioner

## 2014-09-06 DIAGNOSIS — F411 Generalized anxiety disorder: Secondary | ICD-10-CM

## 2014-09-06 NOTE — Telephone Encounter (Signed)
PATIENT FOUND RX

## 2014-09-06 NOTE — Telephone Encounter (Signed)
Xanax rx was written on 08/18/14- was given 2 refills just call pharmacy

## 2014-09-07 DIAGNOSIS — C50919 Malignant neoplasm of unspecified site of unspecified female breast: Secondary | ICD-10-CM | POA: Insufficient documentation

## 2014-09-07 NOTE — Telephone Encounter (Signed)
Several attempts have been made to contact patient.  

## 2014-09-08 ENCOUNTER — Encounter: Payer: Self-pay | Admitting: Internal Medicine

## 2014-09-09 ENCOUNTER — Telehealth: Payer: Self-pay | Admitting: Internal Medicine

## 2014-09-09 NOTE — Telephone Encounter (Signed)
Dr Melvyn Novas please advise if you have tried contacting this patient? I do not see anything in EPIC where a nurse has attempted to call her.  Thanks.

## 2014-09-10 ENCOUNTER — Encounter (HOSPITAL_COMMUNITY): Payer: Self-pay | Admitting: *Deleted

## 2014-09-10 NOTE — Telephone Encounter (Signed)
Spoke with the pt  She states that it was [name] who left her a VM 2-3 days ago She wants to hear from him asap b/c she states that she never heard back about her biopsy results  Please advise thanks

## 2014-09-10 NOTE — Telephone Encounter (Signed)
I have faxed the Path report to Dr Drue Stager via [employer]

## 2014-09-10 NOTE — Telephone Encounter (Signed)
My understanding from her is that she had appt with oncology w/in the next week so it was probably them See if you can confirm with her who she is seeing (she didn't know the last time I talked to her and I want to make sure she sees them asap)

## 2014-09-10 NOTE — Telephone Encounter (Signed)
Again reviewed dx of lung ca related to smoking and metastatic but responds to treatment once it gets started  Planning to see Dr Drue Stager next week but family wants her seen at [facility] - defer to Dr Drue Stager  Be sure to send him copy of path report from lung bx

## 2014-09-10 NOTE — Progress Notes (Signed)
No pre-op orders in EPIC, called Dr. Ethlyn Gallery office and spoke with Jearld F[place] requesting orders. She states she will send Dr. Marlou Starks a message to put orders in.

## 2014-09-10 NOTE — Progress Notes (Signed)
Instructed pt during pre-op call to stop Aspirin as of today. She voiced understanding.

## 2014-09-13 ENCOUNTER — Ambulatory Visit (HOSPITAL_COMMUNITY): Payer: Medicare Other | Admitting: Certified Registered Nurse Anesthetist

## 2014-09-13 ENCOUNTER — Ambulatory Visit (HOSPITAL_COMMUNITY): Payer: Medicare Other

## 2014-09-13 ENCOUNTER — Encounter (HOSPITAL_COMMUNITY): Payer: Medicare Other | Admitting: Certified Registered Nurse Anesthetist

## 2014-09-13 ENCOUNTER — Encounter (HOSPITAL_COMMUNITY)
Admission: RE | Disposition: A | Discharge status: Home / Self Care | Payer: Self-pay | Source: Ambulatory Visit | Attending: General Surgery

## 2014-09-13 ENCOUNTER — Ambulatory Visit (HOSPITAL_COMMUNITY)
Admission: RE | Admit: 2014-09-13 | Discharge: 2014-09-15 | Disposition: A | Discharge status: Home / Self Care | Payer: Medicare Other | Source: Ambulatory Visit | Attending: General Surgery | Admitting: General Surgery

## 2014-09-13 ENCOUNTER — Encounter (HOSPITAL_COMMUNITY): Payer: Self-pay | Admitting: *Deleted

## 2014-09-13 DIAGNOSIS — Z720 Tobacco use: Secondary | ICD-10-CM | POA: Diagnosis not present

## 2014-09-13 DIAGNOSIS — Y838 Other surgical procedures as the cause of abnormal reaction of the patient, or of later complication, without mention of misadventure at the time of the procedure: Secondary | ICD-10-CM | POA: Diagnosis not present

## 2014-09-13 DIAGNOSIS — E039 Hypothyroidism, unspecified: Secondary | ICD-10-CM | POA: Insufficient documentation

## 2014-09-13 DIAGNOSIS — Y836 Removal of other organ (partial) (total) as the cause of abnormal reaction of the patient, or of later complication, without mention of misadventure at the time of the procedure: Secondary | ICD-10-CM | POA: Insufficient documentation

## 2014-09-13 DIAGNOSIS — Z452 Encounter for adjustment and management of vascular access device: Secondary | ICD-10-CM | POA: Diagnosis present

## 2014-09-13 DIAGNOSIS — C3490 Malignant neoplasm of unspecified part of unspecified bronchus or lung: Secondary | ICD-10-CM | POA: Diagnosis not present

## 2014-09-13 DIAGNOSIS — Z9011 Acquired absence of right breast and nipple: Secondary | ICD-10-CM | POA: Insufficient documentation

## 2014-09-13 DIAGNOSIS — C50911 Malignant neoplasm of unspecified site of right female breast: Secondary | ICD-10-CM

## 2014-09-13 DIAGNOSIS — J95811 Postprocedural pneumothorax: Secondary | ICD-10-CM | POA: Diagnosis not present

## 2014-09-13 DIAGNOSIS — I10 Essential (primary) hypertension: Secondary | ICD-10-CM | POA: Diagnosis not present

## 2014-09-13 DIAGNOSIS — Z8673 Personal history of transient ischemic attack (TIA), and cerebral infarction without residual deficits: Secondary | ICD-10-CM | POA: Diagnosis not present

## 2014-09-13 DIAGNOSIS — C50411 Malignant neoplasm of upper-outer quadrant of right female breast: Secondary | ICD-10-CM | POA: Insufficient documentation

## 2014-09-13 DIAGNOSIS — Z79899 Other long term (current) drug therapy: Secondary | ICD-10-CM | POA: Diagnosis not present

## 2014-09-13 DIAGNOSIS — Z7982 Long term (current) use of aspirin: Secondary | ICD-10-CM | POA: Diagnosis not present

## 2014-09-13 DIAGNOSIS — L7622 Postprocedural hemorrhage and hematoma of skin and subcutaneous tissue following other procedure: Secondary | ICD-10-CM | POA: Insufficient documentation

## 2014-09-13 DIAGNOSIS — Z95828 Presence of other vascular implants and grafts: Secondary | ICD-10-CM

## 2014-09-13 DIAGNOSIS — F419 Anxiety disorder, unspecified: Secondary | ICD-10-CM | POA: Insufficient documentation

## 2014-09-13 HISTORY — DX: Post-traumatic stress disorder, unspecified: F43.10

## 2014-09-13 HISTORY — PX: PORTACATH PLACEMENT: SHX2246

## 2014-09-13 HISTORY — DX: Malignant (primary) neoplasm, unspecified: C80.1

## 2014-09-13 HISTORY — PX: INCISION AND DRAINAGE: SHX5863

## 2014-09-13 SURGERY — INCISION AND DRAINAGE
Anesthesia: General | Site: Chest | Laterality: Right

## 2014-09-13 MED ORDER — BUPIVACAINE-EPINEPHRINE 0.25% -1:200000 IJ SOLN
INTRAMUSCULAR | Status: DC | PRN
  Administered 2014-09-13: 8 mL

## 2014-09-13 MED ORDER — HEPARIN SOD (PORK) LOCK FLUSH 100 UNIT/ML IV SOLN
INTRAVENOUS | Status: AC
  Filled 2014-09-13: qty 5

## 2014-09-13 MED ORDER — MEPERIDINE HCL 25 MG/ML IJ SOLN: 6.2500 mg | INTRAMUSCULAR | Status: DC | PRN

## 2014-09-13 MED ORDER — OXYCODONE HCL 5 MG/5ML PO SOLN: 5.0000 mg | Freq: Once | ORAL | Status: DC | PRN

## 2014-09-13 MED ORDER — PROPOFOL 10 MG/ML IV BOLUS
INTRAVENOUS | Status: AC
  Filled 2014-09-13: qty 20

## 2014-09-13 MED ORDER — ALBUTEROL SULFATE (2.5 MG/3ML) 0.083% IN NEBU: 3.0000 mL | INHALATION_SOLUTION | Freq: Four times a day (QID) | RESPIRATORY_TRACT | Status: DC | PRN

## 2014-09-13 MED ORDER — LIDOCAINE HCL (CARDIAC) 20 MG/ML IV SOLN
INTRAVENOUS | Status: DC | PRN
  Administered 2014-09-13: 60 mg via INTRAVENOUS

## 2014-09-13 MED ORDER — LACTATED RINGERS IV SOLN
INTRAVENOUS | Status: DC | PRN
  Administered 2014-09-13: 13:00:00 via INTRAVENOUS

## 2014-09-13 MED ORDER — EPHEDRINE SULFATE 50 MG/ML IJ SOLN
INTRAMUSCULAR | Status: DC | PRN
  Administered 2014-09-13: 10 mg via INTRAVENOUS

## 2014-09-13 MED ORDER — OXYCODONE-ACETAMINOPHEN 5-325 MG PO TABS: 1.0000 | ORAL_TABLET | ORAL | Status: DC | PRN

## 2014-09-13 MED ORDER — HYPROMELLOSE (GONIOSCOPIC) 2.5 % OP SOLN: 1.0000 [drp] | Freq: Three times a day (TID) | OPHTHALMIC | Status: DC | PRN

## 2014-09-13 MED ORDER — PROPOFOL 10 MG/ML IV BOLUS
INTRAVENOUS | Status: DC | PRN
  Administered 2014-09-13: 150 mg via INTRAVENOUS

## 2014-09-13 MED ORDER — FENTANYL CITRATE 0.05 MG/ML IJ SOLN
INTRAMUSCULAR | Status: DC | PRN
  Administered 2014-09-13: 50 ug via INTRAVENOUS

## 2014-09-13 MED ORDER — LACTATED RINGERS IV SOLN
INTRAVENOUS | Status: DC
  Administered 2014-09-13: 11:00:00 via INTRAVENOUS

## 2014-09-13 MED ORDER — DEXAMETHASONE SODIUM PHOSPHATE 4 MG/ML IJ SOLN
INTRAMUSCULAR | Status: DC | PRN
  Administered 2014-09-13: 4 mg via INTRAVENOUS

## 2014-09-13 MED ORDER — SODIUM CHLORIDE 0.9 % IR SOLN
Status: DC | PRN
  Administered 2014-09-13: 14:00:00

## 2014-09-13 MED ORDER — CEFAZOLIN SODIUM-DEXTROSE 2-3 GM-% IV SOLR
2.0000 g | Freq: Once | INTRAVENOUS | Status: AC
  Administered 2014-09-13: 2 g via INTRAVENOUS
  Filled 2014-09-13: qty 50

## 2014-09-13 MED ORDER — OXYCODONE HCL 5 MG PO TABS: 5.0000 mg | ORAL_TABLET | Freq: Once | ORAL | Status: DC | PRN

## 2014-09-13 MED ORDER — 0.9 % SODIUM CHLORIDE (POUR BTL) OPTIME
TOPICAL | Status: DC | PRN
  Administered 2014-09-13: 1000 mL

## 2014-09-13 MED ORDER — HEPARIN SOD (PORK) LOCK FLUSH 100 UNIT/ML IV SOLN
INTRAVENOUS | Status: DC | PRN
Start: 2014-09-13 — End: 2014-09-13
  Administered 2014-09-13: 450 [IU] via INTRAVENOUS

## 2014-09-13 MED ORDER — ALPRAZOLAM 0.5 MG PO TABS
1.0000 mg | ORAL_TABLET | Freq: Three times a day (TID) | ORAL | Status: DC | PRN
  Administered 2014-09-13 – 2014-09-14 (×3): 1 mg via ORAL
  Filled 2014-09-13 (×3): qty 2

## 2014-09-13 MED ORDER — ONDANSETRON HCL 4 MG/2ML IJ SOLN: 4.0000 mg | Freq: Once | INTRAMUSCULAR | Status: DC | PRN

## 2014-09-13 MED ORDER — BUPIVACAINE-EPINEPHRINE (PF) 0.25% -1:200000 IJ SOLN
INTRAMUSCULAR | Status: AC
  Filled 2014-09-13: qty 30

## 2014-09-13 MED ORDER — HYDROMORPHONE HCL 1 MG/ML IJ SOLN: 0.2500 mg | INTRAMUSCULAR | Status: DC | PRN

## 2014-09-13 MED ORDER — LEVOTHYROXINE SODIUM 112 MCG PO TABS
112.0000 ug | ORAL_TABLET | Freq: Every day | ORAL | Status: DC
  Administered 2014-09-14 – 2014-09-15 (×2): 112 ug via ORAL
  Filled 2014-09-13 (×3): qty 1

## 2014-09-13 MED ORDER — POLYVINYL ALCOHOL 1.4 % OP SOLN
1.0000 [drp] | OPHTHALMIC | Status: DC | PRN
  Filled 2014-09-13: qty 15

## 2014-09-13 MED ORDER — ONDANSETRON HCL 4 MG PO TABS: 4.0000 mg | ORAL_TABLET | Freq: Four times a day (QID) | ORAL | Status: DC | PRN

## 2014-09-13 MED ORDER — FENTANYL CITRATE 0.05 MG/ML IJ SOLN
INTRAMUSCULAR | Status: AC
  Filled 2014-09-13: qty 5

## 2014-09-13 MED ORDER — MORPHINE SULFATE 2 MG/ML IJ SOLN: 2.0000 mg | INTRAMUSCULAR | Status: DC | PRN

## 2014-09-13 MED ORDER — DILTIAZEM HCL ER 180 MG PO CP24
180.0000 mg | ORAL_CAPSULE | Freq: Every day | ORAL | Status: DC
  Administered 2014-09-14 – 2014-09-15 (×2): 180 mg via ORAL
  Filled 2014-09-13 (×2): qty 1

## 2014-09-13 MED ORDER — ONDANSETRON HCL 4 MG/2ML IJ SOLN: 4.0000 mg | Freq: Four times a day (QID) | INTRAMUSCULAR | Status: DC | PRN

## 2014-09-13 MED ORDER — PHENYLEPHRINE HCL 10 MG/ML IJ SOLN
INTRAMUSCULAR | Status: DC | PRN
  Administered 2014-09-13: 80 ug via INTRAVENOUS
  Administered 2014-09-13: 40 ug via INTRAVENOUS
  Administered 2014-09-13: 120 ug via INTRAVENOUS
  Administered 2014-09-13: 80 ug via INTRAVENOUS
  Administered 2014-09-13 (×2): 40 ug via INTRAVENOUS

## 2014-09-13 MED ORDER — KCL IN DEXTROSE-NACL 20-5-0.9 MEQ/L-%-% IV SOLN
INTRAVENOUS | Status: DC
  Administered 2014-09-13 – 2014-09-14 (×2): via INTRAVENOUS
  Filled 2014-09-13 (×3): qty 1000

## 2014-09-13 MED ORDER — LISINOPRIL 40 MG PO TABS
40.0000 mg | ORAL_TABLET | Freq: Every day | ORAL | Status: DC
  Administered 2014-09-14 – 2014-09-15 (×2): 40 mg via ORAL
  Filled 2014-09-13 (×2): qty 1

## 2014-09-13 MED ORDER — ONDANSETRON HCL 4 MG/2ML IJ SOLN
INTRAMUSCULAR | Status: DC | PRN
  Administered 2014-09-13: 4 mg via INTRAVENOUS

## 2014-09-13 MED ORDER — OXYCODONE-ACETAMINOPHEN 5-325 MG PO TABS
1.0000 | ORAL_TABLET | ORAL | Status: DC | PRN
Start: 2014-09-13 — End: 2014-09-15
  Administered 2014-09-13: 2 via ORAL
  Administered 2014-09-14: 1 via ORAL
  Filled 2014-09-13: qty 1
  Filled 2014-09-13: qty 2

## 2014-09-13 MED ORDER — MIDAZOLAM HCL 2 MG/2ML IJ SOLN
INTRAMUSCULAR | Status: AC
  Filled 2014-09-13: qty 2

## 2014-09-13 SURGICAL SUPPLY — 53 items
ADH SKN CLS APL DERMABOND .7 (GAUZE/BANDAGES/DRESSINGS) ×2
BAG DECANTER FOR FLEXI CONT (MISCELLANEOUS) ×4 IMPLANT
BLADE SURG 11 STRL SS (BLADE) ×4 IMPLANT
BLADE SURG 15 STRL LF DISP TIS (BLADE) ×2 IMPLANT
BLADE SURG 15 STRL SS (BLADE) ×4
CHLORAPREP W/TINT 10.5 ML (MISCELLANEOUS) ×4 IMPLANT
CHLORAPREP W/TINT 26ML (MISCELLANEOUS) ×2 IMPLANT
COVER SURGICAL LIGHT HANDLE (MISCELLANEOUS) ×4 IMPLANT
CRADLE DONUT ADULT HEAD (MISCELLANEOUS) ×4 IMPLANT
DERMABOND ADVANCED (GAUZE/BANDAGES/DRESSINGS) ×2
DERMABOND ADVANCED .7 DNX12 (GAUZE/BANDAGES/DRESSINGS) ×2 IMPLANT
DRAPE C-ARM 42X72 X-RAY (DRAPES) ×4 IMPLANT
DRAPE CHEST BREAST 15X10 FENES (DRAPES) ×4 IMPLANT
DRAPE UTILITY 15X26 W/TAPE STR (DRAPE) ×8 IMPLANT
ELECT CAUTERY BLADE 6.4 (BLADE) ×4 IMPLANT
ELECT REM PT RETURN 9FT ADLT (ELECTROSURGICAL) ×4
ELECTRODE REM PT RTRN 9FT ADLT (ELECTROSURGICAL) ×2 IMPLANT
GAUZE SPONGE 4X4 16PLY XRAY LF (GAUZE/BANDAGES/DRESSINGS) ×4 IMPLANT
GLOVE BIO SURGEON STRL SZ7.5 (GLOVE) ×6 IMPLANT
GLOVE BIOGEL PI IND STRL 7.5 (GLOVE) IMPLANT
GLOVE BIOGEL PI INDICATOR 7.5 (GLOVE) ×4
GOWN STRL REUS W/ TWL LRG LVL3 (GOWN DISPOSABLE) ×4 IMPLANT
GOWN STRL REUS W/TWL LRG LVL3 (GOWN DISPOSABLE) ×8
INTRODUCER COOK 11FR (CATHETERS) IMPLANT
KIT BASIN OR (CUSTOM PROCEDURE TRAY) ×4 IMPLANT
KIT PORT POWER 8FR ISP CVUE (Catheter) ×2 IMPLANT
KIT PORT POWER 9.6FR MRI PREA (Catheter) IMPLANT
KIT PORT POWER ISP 8FR (Catheter) IMPLANT
KIT POWER CATH 8FR (Catheter) IMPLANT
KIT ROOM TURNOVER OR (KITS) ×4 IMPLANT
NDL HYPO 25GX1X1/2 BEV (NEEDLE) ×2 IMPLANT
NEEDLE 22X1 1/2 (OR ONLY) (NEEDLE) ×2 IMPLANT
NEEDLE HYPO 25GX1X1/2 BEV (NEEDLE) ×4 IMPLANT
NS IRRIG 1000ML POUR BTL (IV SOLUTION) ×4 IMPLANT
PACK SURGICAL SETUP 50X90 (CUSTOM PROCEDURE TRAY) ×4 IMPLANT
PAD ARMBOARD 7.5X6 YLW CONV (MISCELLANEOUS) ×4 IMPLANT
PENCIL BUTTON HOLSTER BLD 10FT (ELECTRODE) ×4 IMPLANT
SET INTRODUCER 12FR PACEMAKER (SHEATH) IMPLANT
SET SHEATH INTRODUCER 10FR (MISCELLANEOUS) IMPLANT
SHEATH COOK PEEL AWAY SET 9F (SHEATH) IMPLANT
SUT MNCRL AB 4-0 PS2 18 (SUTURE) ×4 IMPLANT
SUT PROLENE 2 0 SH 30 (SUTURE) ×8 IMPLANT
SUT SILK 2 0 (SUTURE)
SUT SILK 2-0 18XBRD TIE 12 (SUTURE) IMPLANT
SUT VIC AB 3-0 SH 27 (SUTURE) ×4
SUT VIC AB 3-0 SH 27XBRD (SUTURE) ×2 IMPLANT
SYR 20CC LL (SYRINGE) ×2 IMPLANT
SYR 20ML ECCENTRIC (SYRINGE) ×8 IMPLANT
SYR 5ML LUER SLIP (SYRINGE) ×4 IMPLANT
SYR CONTROL 10ML LL (SYRINGE) ×4 IMPLANT
SYRINGE 10CC LL (SYRINGE) IMPLANT
TOWEL OR 17X24 6PK STRL BLUE (TOWEL DISPOSABLE) ×2 IMPLANT
TOWEL OR 17X26 10 PK STRL BLUE (TOWEL DISPOSABLE) ×4 IMPLANT

## 2014-09-13 NOTE — Anesthesia Postprocedure Evaluation (Signed)
Anesthesia Post Note  Patient: [name]  Procedure(s) Performed: Procedure(s) (LRB): DRAINAGE OF RIGHT MASTECTOMY SEROMA (Right) INSERTION PORT-A-CATH LEFT SUBCLAVIAN (Left)  Anesthesia type: general  Patient location: PACU  Post pain: Pain level controlled  Post assessment: Patient's Cardiovascular Status Stable  Last Vitals:  Filed Vitals:   09/13/14 1530  BP: 131/54  Pulse:   Temp:   Resp:     Post vital signs: Reviewed and stable  Level of consciousness: sedated  Complications: No apparent anesthesia complications

## 2014-09-13 NOTE — Anesthesia Preprocedure Evaluation (Addendum)
Anesthesia Evaluation  Patient identified by MRN, date of birth, ID band Patient awake    Reviewed: Allergy & Precautions, H&P , NPO status , Patient's Chart, lab work & pertinent test results  Airway Mallampati: II TM Distance: >3 FB Neck ROM: Full    Dental no notable dental hx. (+) Edentulous Lower, Edentulous Upper, Dental Advisory Given   Pulmonary Current Smoker,  breath sounds clear to auscultation  Pulmonary exam normal       Cardiovascular hypertension, Pt. on medications     Neuro/Psych PSYCHIATRIC DISORDERS Anxiety Depression CVA, Residual Symptoms    GI/Hepatic negative GI ROS, Neg liver ROS,   Endo/Other  Hypothyroidism   Renal/GU negative Renal ROS     Musculoskeletal negative musculoskeletal ROS (+)   Abdominal   Peds  Hematology negative hematology ROS (+)   Anesthesia Other Findings   Reproductive/Obstetrics negative OB ROS                          Anesthesia Physical  Anesthesia Plan  ASA: III  Anesthesia Plan: General   Post-op Pain Management:    Induction: Intravenous  Airway Management Planned: LMA  Additional Equipment:   Intra-op Plan:   Post-operative Plan: Extubation in OR  Informed Consent: I have reviewed the patients History and Physical, chart, labs and discussed the procedure including the risks, benefits and alternatives for the proposed anesthesia with the patient or authorized representative who has indicated his/her understanding and acceptance.   Dental advisory given  Plan Discussed with: CRNA and Surgeon  Anesthesia Plan Comments:        Anesthesia Quick Evaluation

## 2014-09-13 NOTE — Progress Notes (Signed)
CXR results called to Dr. Marlou Starks. Order to keep patient overnight for observation. Family and patient updated. Will request [place] bed

## 2014-09-13 NOTE — Transfer of Care (Signed)
Immediate Anesthesia Transfer of Care Note  Patient: [name]  Procedure(s) Performed: Procedure(s): DRAINAGE OF RIGHT MASTECTOMY SEROMA (Right) INSERTION PORT-A-CATH LEFT SUBCLAVIAN (Left)  Patient Location: PACU  Anesthesia Type:General  Level of Consciousness: awake, alert  and oriented  Airway & Oxygen Therapy: Patient Spontanous Breathing and Patient connected to nasal cannula oxygen  Post-op Assessment: Report given to PACU RN and Post -op Vital signs reviewed and stable  Post vital signs: Reviewed and stable  Complications: No apparent anesthesia complications

## 2014-09-13 NOTE — Anesthesia Procedure Notes (Signed)
Procedure Name: LMA Insertion Date/Time: 09/13/2014 1:55 PM Performed by: Garner Nash Pre-anesthesia Checklist: Patient identified, Timeout performed, Emergency Drugs available, Suction available and Patient being monitored Patient Re-evaluated:Patient Re-evaluated prior to inductionOxygen Delivery Method: Circle system utilized Preoxygenation: Pre-oxygenation with 100% oxygen Intubation Type: IV induction Ventilation: Mask ventilation without difficulty LMA: LMA inserted LMA Size: 4.0 Placement Confirmation: positive ETCO2,  CO2 detector and breath sounds checked- equal and bilateral Tube secured with: Tape Dental Injury: Teeth and Oropharynx as per pre-operative assessment

## 2014-09-13 NOTE — H&P (Signed)
[name] 09/07/2014 9:13 AM Location: [facility] Patient #: [id number] DOB: [birth year] Single / Language: Cleophus Molt / Race: White Female  History of Present Illness Sammuel Hines. Marlou Starks MD; 72/13/2015 10:02 AM) Patient words: mastectomy seroma check.  The patient is a 73 year old female who presents for a follow-up for breast cancer. The patient is a 73 year old white female who is 2 months status post right modified radical mastectomy for a T2 N1 a right breast cancer. Her course has been complicated by a persistent seroma laterally under the skin flap. She states that last Friday she also had a lung biopsy performed. She will be following up today with the oncologist for the results of this biopsy.   Other Problems Ventura Sellers, CMA; 09/07/2014 9:14 AM) Thyroid Disease High blood pressure Anxiety Disorder Cholelithiasis Cerebrovascular Accident MALIGNANT NEOPLASM OF RIGHT FEMALE BREAST, UNSPECIFIED SITE OF BREAST (174.9  C50.911) MALIGNANT NEOPLASM OF UPPER-OUTER QUADRANT OF RIGHT FEMALE BREAST (174.4  C50.411)  Past Surgical History Ventura Sellers, [place]; 09/07/2014 9:14 AM) Cataract Surgery Bilateral. Carotid Artery Surgery Left. Gallbladder Surgery - Laparoscopic Colon Polyp Removal - Colonoscopy Breast Mass; Local Excision Right. Breast Biopsy Right. multiple Mastectomy Right.  Diagnostic Studies History Ventura Sellers, [place]; 09/07/2014 9:14 AM) Mammogram within last year Colonoscopy within last year Pap Smear >5 years ago  Allergies Ventura Sellers, CMA; 09/07/2014 9:14 AM) Lactose Intolerance *DIGESTIVE AIDS*  Medication History Ventura Sellers, CMA; 09/07/2014 9:14 AM) Albuterol Sulfate (108 (90 Base)MCG/ACT Aero Pow Br Act, Inhalation) Active. Xanax (1MG  Tablet, Oral) Active. Aspirin EC Low Strength (81MG  Tablet DR, Oral) Active. Vitamin D (Cholecalciferol) (1000UNIT Tablet, Oral) Active. Dilacor XR (180MG  Capsule  ER 24HR, Oral) Active. Isopto Alkaline (1% Solution, Ophthalmic) Active. Levothyroxine Sodium (112MCG Tablet, Oral) Active. Lisinopril (40MG  Tablet, Oral) Active.  Social History Ventura Sellers, [place]; 09/07/2014 9:14 AM) Caffeine use Carbonated beverages. Tobacco use Current every day smoker. Alcohol use Occasional alcohol use. No drug use  Family History Ventura Sellers, [place]; 09/07/2014 9:14 AM) Thyroid problems Mother. Migraine Headache Family Members In General, Father. Hypertension Father. Respiratory Condition Daughter. Prostate Cancer Father. Diabetes Mellitus Family Members In General. Alcohol Abuse Brother. Breast Cancer Family Members In General, Sister. Arthritis Daughter, Father.  Pregnancy / Birth History Ventura Sellers, [place]; 09/07/2014 9:14 AM) Para 3 Maternal age 36-20 Age at menarche 58 years. Gravida 3 Age of menopause <45  Vitals Sharyn Lull R. Brooks CMA; 09/07/2014 9:14 AM) 09/07/2014 9:13 AM Weight: 146.5 lb Height: 67in Body Surface Area: 1.77 m Body Mass Index: 22.94 kg/m Temp.: 41F(Oral)  Pulse: 72 (Regular)  BP: 138/60 (Sitting, Left Arm, Standard)    Physical Exam Eddie Dibbles S. Marlou Starks MD; 09/07/2014 10:03 AM) Breast Note: Her mastectomy incision is healing nicely. Her skin flaps are healthy. She has still has a small seroma that is palpable laterally. This area was prepped with ChloraPrep and infiltrated with 1% lidocaine. Approximately 50 cc of serous fluid was evacuated with an 18-gauge needle and 60 cc syringe and she tolerated this well.     Assessment & Plan Eddie Dibbles S. Marlou Starks MD; 09/07/2014 9:26 AM) MALIGNANT NEOPLASM OF UPPER-OUTER QUADRANT OF RIGHT FEMALE BREAST (174.4  C50.411) Impression: The patient is about 2 months status post right modified radical mastectomy for breast cancer. She has followups later today with the oncologist for her lung biopsy. I will plan to see her back in about 2 weeks to  check for any residual seroma fluid Current Plans  Follow  up in 2 weeks or as needed The oncologist has asked for a port a cath to be placed. I have discussed with her the risks and benefits of this as well as some of the technical aspects and she understands and wishes to proceed.  Signed by Luella Cook, MD (09/07/2014 10:03 AM)

## 2014-09-13 NOTE — Op Note (Signed)
09/13/2014  2:33 PM  PATIENT:  [name]  73 y.o. female  PRE-OPERATIVE DIAGNOSIS:  NEEDS CHEMOTHERAPY  POST-OPERATIVE DIAGNOSIS:  NEEDS CHEMOTHERAPY  PROCEDURE:  Procedure(s): DRAINAGE OF RIGHT MASTECTOMY SEROMA (Right) INSERTION PORT-A-CATH LEFT SUBCLAVIAN (Left)  SURGEON:  Surgeon(s) and Role:    * Jovita Kussmaul, MD - Primary  PHYSICIAN ASSISTANT:   ASSISTANTS: Sharyn Dross, RNFA   ANESTHESIA:   general  EBL:  Total I/O In: 750 [I.V.:750] Out: 5 [Blood:5]  BLOOD ADMINISTERED:none  DRAINS: none   LOCAL MEDICATIONS USED:  MARCAINE     SPECIMEN:  No Specimen  DISPOSITION OF SPECIMEN:  N/A  COUNTS:  YES  TOURNIQUET:  * No tourniquets in log *  DICTATION: .Dragon Dictation After informed consent was obtained the patient was brought to the operating room and placed in the supine position on the operating room table. After adequate induction of general anesthesia a roll was placed between the patient's shoulder blades to extend the shoulder slightly. The patient's chest and neck area was then prepped with ChloraPrep, allowed to dry, and draped in usual sterile manner. The patient was placed in Trendelenburg position. The area lateral to the bend of the clavicle and the left chest was infiltrated with quarter Marcaine. A small incision was made with a 15 blade knife just lateral to the bend of the clavicle. A large bore needle from the Port-A-Cath kit was then used to slide beneath the bend of the clavicle heading towards the sternal notch and in doing so we were able to readily accessed the left subclavian vein. A wire was fed through the needle using the Seldinger technique without difficulty. The wire was confirmed in the central venous system using real-time fluoroscopy. Next the incision on the left chest wall was extended slightly. A subcutaneous pocket was created inferior to the incision using blunt finger dissection and some sharp dissection with the electrocautery.  Next the tubing was attached to the reservoir and the reservoir was placed in the pocket. The length of the tubing was again estimated using real-time fluoroscopy and the tubing was cut to the appropriate length. Next a sheath and dilator were fed over the wire using the Seldinger technique without difficulty. The dilator and wire were removed. The tubing was fed through the sheath as far as it would go and held in place while the sheath was gently cracked and separated. Another real-time fluoroscopy image showed that the tip of the catheter was in the superior vena cava. Next the tubing was permanently anchored to the reservoir. The reservoir was anchored in the subcutaneous pocket with 2 2-0 Prolene stitches. The port was then aspirated and it aspirated blood easily. The port was then flushed initially with a dilute heparin solution and then with a more concentrated heparin solution. The subcutaneous tissue was closed over the port with interrupted 3-0 Vicryl stitches. The skin was then closed with a running 4-0 Monocryl subcuticular stitch. Dermabond dressings were applied. The patient tolerated the procedure well. At the end of the case on needle sponge and instrument counts were correct. The patient was then awakened and taken to recovery in stable condition.  PLAN OF CARE: Discharge to home after PACU  PATIENT DISPOSITION:  PACU - hemodynamically stable.   Delay start of Pharmacological VTE agent (>24hrs) due to surgical blood loss or risk of bleeding: not applicable

## 2014-09-13 NOTE — Interval H&P Note (Signed)
History and Physical Interval Note:  09/13/2014 1:15 PM  [name]  has presented today for surgery, with the diagnosis of NEEDS CHEMOTHERAPY  The various methods of treatment have been discussed with the patient and family. After consideration of risks, benefits and other options for treatment, the patient has consented to  Procedure(s): INSERTION PORT-A-CATH (N/A) as a surgical intervention .  The patient's history has been reviewed, patient examined, no change in status, stable for surgery.  I have reviewed the patient's chart and labs.  Questions were answered to the patient's satisfaction.     [name],Ashaun Gaughan S

## 2014-09-14 ENCOUNTER — Ambulatory Visit (HOSPITAL_COMMUNITY): Payer: Medicare Other

## 2014-09-14 ENCOUNTER — Encounter (HOSPITAL_COMMUNITY): Payer: Self-pay | Admitting: General Practice

## 2014-09-14 DIAGNOSIS — Z452 Encounter for adjustment and management of vascular access device: Secondary | ICD-10-CM | POA: Diagnosis not present

## 2014-09-14 NOTE — Progress Notes (Signed)
1 Day Post-Op  Subjective: No complaints  Objective: Vital signs in last 24 hours: Temp:  [97 F (36.1 C)-98.2 F (36.8 C)] 97.9 F (36.6 C) (10/20 0616) Pulse Rate:  [59-73] 70 (10/20 0616) Resp:  [15-20] 16 (10/20 0616) BP: (91-136)/(39-75) 108/62 mmHg (10/20 0616) SpO2:  [94 %-100 %] 96 % (10/20 0616) Weight:  [150 lb (68.04 kg)] 150 lb (68.04 kg) (10/19 1042) Last BM Date: 09/12/14  Intake/Output from previous day: 10/19 0701 - 10/20 0700 In: 1235 [I.V.:1235] Out: 305 [Urine:300; Blood:5] Intake/Output this shift:    Resp: clear to auscultation bilaterally Cardio: regular rate and rhythm GI: soft, non-tender; bowel sounds normal; no masses,  no organomegaly  Lab Results:  No results found for this basename: WBC, HGB, HCT, PLT,  in the last 72 hours BMET No results found for this basename: NA, K, CL, CO2, GLUCOSE, BUN, CREATININE, CALCIUM,  in the last 72 hours PT/INR No results found for this basename: LABPROT, INR,  in the last 72 hours ABG No results found for this basename: PHART, PCO2, PO2, HCO3,  in the last 72 hours  Studies/Results: Dg Chest Portable 1 View  09/14/2014   CLINICAL DATA:  Line placement.  EXAM: PORTABLE CHEST - 1 VIEW  COMPARISON:  09/13/2014  FINDINGS: Left subclavian Port-A-Cath has tip overlying the SVC unchanged. Lungs are adequately inflated with a small left-sided pneumothorax slightly worse within the apex and now seen over the lateral left lower thorax also. Subtle opacification in the left base likely atelectasis. Cardiomediastinal silhouette and remainder of the exam is unchanged.  IMPRESSION: Mild worsening of a small left pneumothorax. Minimal left basilar atelectasis.  These results will be called to the ordering clinician or representative by the Radiologist Assistant, and communication documented in the PACS or zVision Dashboard.   Electronically Signed   By: Marin Olp M.D.   On: 09/14/2014 08:19   Dg Chest Port 1  View  09/13/2014   CLINICAL DATA:  Lung cancer, post LEFT side Port-A-Cath placement, personal history hypertension, TIA, breast cancer, partial mastectomy, stroke, wheezing, PTSD  EXAM: PORTABLE CHEST - 1 VIEW  COMPARISON:  Portable exam 1534 hr compared to 09/03/2014  FINDINGS: New LEFT subclavian Port-A-Cath with tip projecting over SVC.  Upper normal heart size.  Normal mediastinal contours and pulmonary vascularity.  Post RIGHT mastectomy and axillary node dissection.  Emphysematous and bronchitic changes.  Calcified LEFT hilar adenopathy.  No acute infiltrate, pleural effusion, or definite pulmonary mass/nodule.  Tiny LEFT apex pneumothorax medially.  Bones demineralized.  IMPRESSION: Tiny LEFT apex pneumothorax post LEFT subclavian Port-A-Cath insertion.  Changes of COPD and old granulomatous disease.  Critical Value/emergent results were called by telephone at the time of interpretation on 09/13/2014 at 1550 hr to [facility] in PACU, who verbally acknowledged these results.   Electronically Signed   By: Lavonia Dana M.D.   On: 09/13/2014 15:53   Dg Fluoro Guide Cv Line-no Report  09/13/2014   CLINICAL DATA:    FLOURO GUIDE CV LINE  Fluoroscopy was utilized by the requesting physician.  No radiographic  interpretation.     Anti-infectives: Anti-infectives   Start     Dose/Rate Route Frequency Ordered Stop   09/13/14 1200  ceFAZolin (ANCEF) IVPB 2 g/50 mL premix     2 g 100 mL/hr over 30 Minutes Intravenous  Once 09/13/14 1146 09/13/14 1352      Assessment/Plan: s/p Procedure(s): DRAINAGE OF RIGHT MASTECTOMY SEROMA (Right) INSERTION PORT-A-CATH LEFT SUBCLAVIAN (Left) Pneumo a  little larger on xray today. Will keep in hospital another day and check another cxr later today  LOS: 1 day    [name],Jeanpaul Biehl S 09/14/2014

## 2014-09-15 ENCOUNTER — Ambulatory Visit (HOSPITAL_COMMUNITY): Payer: Medicare Other

## 2014-09-15 DIAGNOSIS — Z452 Encounter for adjustment and management of vascular access device: Secondary | ICD-10-CM | POA: Diagnosis not present

## 2014-09-15 NOTE — Discharge Summary (Signed)
Physician Discharge Summary  Patient ID: [name] MRN: [id number] DOB/AGE: [birth year] 72 y.o.  Admit date: 09/13/2014 Discharge date: 09/15/2014  Admission Diagnoses:  Discharge Diagnoses:  Active Problems:   Lung cancer   Discharged Condition: good  Hospital Course: the pt underwent placement of a port. She did well with surgery. Her postop cxr showed a small pneumothorax. She was kept in the hospital to monitor this. The pneumo has been stable over the last 36 hours and she is asymptomatic. She is ready for d/c home  Consults: None  Significant Diagnostic Studies: none  Treatments: surgery: as above  Discharge Exam: Blood pressure 127/54, pulse 67, temperature 97.7 F (36.5 C), temperature source Oral, resp. rate 16, height 5\' 7"  (1.702 m), weight 150 lb (68.04 kg), SpO2 96.00%. Resp: clear to auscultation bilaterally  Disposition: 01-Home or Self Care  Discharge Instructions   Call MD for:  difficulty breathing, headache or visual disturbances    Complete by:  As directed      Call MD for:  difficulty breathing, headache or visual disturbances    Complete by:  As directed      Call MD for:  extreme fatigue    Complete by:  As directed      Call MD for:  extreme fatigue    Complete by:  As directed      Call MD for:  hives    Complete by:  As directed      Call MD for:  hives    Complete by:  As directed      Call MD for:  persistant dizziness or light-headedness    Complete by:  As directed      Call MD for:  persistant dizziness or light-headedness    Complete by:  As directed      Call MD for:  persistant nausea and vomiting    Complete by:  As directed      Call MD for:  persistant nausea and vomiting    Complete by:  As directed      Call MD for:  redness, tenderness, or signs of infection (pain, swelling, redness, odor or green/yellow discharge around incision site)    Complete by:  As directed      Call MD for:  redness, tenderness, or signs of  infection (pain, swelling, redness, odor or green/yellow discharge around incision site)    Complete by:  As directed      Call MD for:  severe uncontrolled pain    Complete by:  As directed      Call MD for:  severe uncontrolled pain    Complete by:  As directed      Call MD for:  temperature >100.4    Complete by:  As directed      Call MD for:  temperature >100.4    Complete by:  As directed      Diet - low sodium heart healthy    Complete by:  As directed      Diet - low sodium heart healthy    Complete by:  As directed      Discharge instructions    Complete by:  As directed   May shower. Diet and activity as tolerated     Discharge instructions    Complete by:  As directed   May shower. Diet and activity as tolerated     Increase activity slowly    Complete by:  As directed  Increase activity slowly    Complete by:  As directed      No wound care    Complete by:  As directed      No wound care    Complete by:  As directed             Medication List         albuterol 108 (90 BASE) MCG/ACT inhaler  Commonly known as:  PROVENTIL HFA;VENTOLIN HFA  Inhale 2 puffs into the lungs every 6 (six) hours as needed for wheezing or shortness of breath.     ALPRAZolam 1 MG tablet  Commonly known as:  XANAX  Take 1 tablet (1 mg total) by mouth 3 (three) times daily as needed for sleep.     ASPIR-81 81 MG EC tablet  Generic drug:  aspirin  Take 81 mg by mouth daily.     cholecalciferol 1000 UNITS tablet  Commonly known as:  VITAMIN D  Take 3,000 Units by mouth daily.     diltiazem 180 MG 24 hr capsule  Commonly known as:  DILACOR XR  Take 1 capsule (180 mg total) by mouth daily.     hydroxypropyl methylcellulose / hypromellose 2.5 % ophthalmic solution  Commonly known as:  ISOPTO TEARS / GONIOVISC  Place 1 drop into both eyes 3 (three) times daily as needed for dry eyes.     levothyroxine 112 MCG tablet  Commonly known as:  SYNTHROID, LEVOTHROID  Take 112 mcg by  mouth daily before breakfast.     lisinopril 40 MG tablet  Commonly known as:  PRINIVIL,ZESTRIL  Take 1 tablet (40 mg total) by mouth daily.     oxyCODONE-acetaminophen 5-325 MG per tablet  Commonly known as:  ROXICET  Take 1-2 tablets by mouth every 4 (four) hours as needed.     varenicline 0.5 MG X 11 & 1 MG X 42 tablet  Commonly known as:  CHANTIX PAK  Take one 0.5 mg tablet by mouth once daily for 3 days, then increase to one 0.5 mg tablet twice daily for 4 days, then increase to one 1 mg tablet twice daily.           Follow-up Information   Follow up with TOTH Joneen Boers, MD In 1 month.   Specialty:  General Surgery   Contact information:   [address] [place] [address] [phone]       Follow up with Merrie Roof, MD In 1 month.   Specialty:  General Surgery   Contact information:   [address] [place] [address] [phone]       Signed: Merrie Roof 09/15/2014, 11:33 AM

## 2014-09-15 NOTE — Progress Notes (Signed)
2 Days Post-Op  Subjective: No complaints  Objective: Vital signs in last 24 hours: Temp:  [97.6 F (36.4 C)-98.1 F (36.7 C)] 97.7 F (36.5 C) (10/21 0509) Pulse Rate:  [67-78] 67 (10/21 0509) Resp:  [16-18] 16 (10/21 0509) BP: (105-134)/(52-65) 127/54 mmHg (10/21 0509) SpO2:  [96 %-98 %] 96 % (10/21 0509) Last BM Date: 09/12/14  Intake/Output from previous day: 10/20 0701 - 10/21 0700 In: 1957.7 [P.O.:840; I.V.:1117.7] Out: -  Intake/Output this shift:    Resp: clear to auscultation bilaterally Cardio: regular rate and rhythm GI: soft, non-tender; bowel sounds normal; no masses,  no organomegaly  Lab Results:  No results found for this basename: WBC, HGB, HCT, PLT,  in the last 72 hours BMET No results found for this basename: NA, K, CL, CO2, GLUCOSE, BUN, CREATININE, CALCIUM,  in the last 72 hours PT/INR No results found for this basename: LABPROT, INR,  in the last 72 hours ABG No results found for this basename: PHART, PCO2, PO2, HCO3,  in the last 72 hours  Studies/Results: Dg Chest 2 View  09/15/2014   CLINICAL DATA:  Followup left pneumothorax post Port-A-Cath placement  EXAM: CHEST  2 VIEW  COMPARISON:  09/14/2014  FINDINGS: Stable left apical and left lateral basilar pneumothorax. Small hydro pneumothorax in the left base is stable. Port-A-Cath tip in the SVC  Right medial apical lung mass unchanged. Right mid lung nodule unchanged. Negative for heart failure or pneumonia.  IMPRESSION: Left-sided pneumothorax unchanged.   Electronically Signed   By: Franchot Gallo M.D.   On: 09/15/2014 09:55   Dg Chest 2 View  09/14/2014   CLINICAL DATA:  Post Port-A-Cath insertion, lung cancer, personal history hypertension, stroke, followup pneumothorax.  EXAM: CHEST  2 VIEW  COMPARISON:  Earlier portable radiograph 09/14/2014 ; correlation PET-CT 08/11/2014  FINDINGS: LEFT subclavian Port-A-Cath tip projecting over mid SVC.  Upper normal heart size.  Atherosclerotic  calcification aorta.  Mediastinal contours and pulmonary vascularity normal.  Calcified adenopathy LEFT hilum.  Emphysematous and bronchitic changes consistent with COPD.  Small LEFT pneumothorax again identified, with slight decrease in a basilar component increase in the apical component versus the prior study likely due to shifting.  Persistent LEFT basilar atelectasis.  Lungs otherwise clear.  Nodular density in RIGHT middle lobe 16 mm diameter again noted.  Larger mass at medial RIGHT upper lobe, less well defined than on prior PET-CT.  Post RIGHT mastectomy and axillary node dissection.  Diffuse osseous demineralization.  IMPRESSION: Shift in small LEFT pneumothorax from base to apex.  COPD changes with evidence of old granulomatous disease.  Known RIGHT middle lobe nodule and RIGHT upper lobe mass.   Electronically Signed   By: Lavonia Dana M.D.   On: 09/14/2014 15:02   Dg Chest Portable 1 View  09/14/2014   CLINICAL DATA:  Line placement.  EXAM: PORTABLE CHEST - 1 VIEW  COMPARISON:  09/13/2014  FINDINGS: Left subclavian Port-A-Cath has tip overlying the SVC unchanged. Lungs are adequately inflated with a small left-sided pneumothorax slightly worse within the apex and now seen over the lateral left lower thorax also. Subtle opacification in the left base likely atelectasis. Cardiomediastinal silhouette and remainder of the exam is unchanged.  IMPRESSION: Mild worsening of a small left pneumothorax. Minimal left basilar atelectasis.  These results will be called to the ordering clinician or representative by the Radiologist Assistant, and communication documented in the PACS or zVision Dashboard.   Electronically Signed   By: Marin Olp  M.D.   On: 09/14/2014 08:19   Dg Chest Port 1 View  09/13/2014   CLINICAL DATA:  Lung cancer, post LEFT side Port-A-Cath placement, personal history hypertension, TIA, breast cancer, partial mastectomy, stroke, wheezing, PTSD  EXAM: PORTABLE CHEST - 1 VIEW   COMPARISON:  Portable exam 1534 hr compared to 09/03/2014  FINDINGS: New LEFT subclavian Port-A-Cath with tip projecting over SVC.  Upper normal heart size.  Normal mediastinal contours and pulmonary vascularity.  Post RIGHT mastectomy and axillary node dissection.  Emphysematous and bronchitic changes.  Calcified LEFT hilar adenopathy.  No acute infiltrate, pleural effusion, or definite pulmonary mass/nodule.  Tiny LEFT apex pneumothorax medially.  Bones demineralized.  IMPRESSION: Tiny LEFT apex pneumothorax post LEFT subclavian Port-A-Cath insertion.  Changes of COPD and old granulomatous disease.  Critical Value/emergent results were called by telephone at the time of interpretation on 09/13/2014 at 1550 hr to [facility] in PACU, who verbally acknowledged these results.   Electronically Signed   By: Lavonia Dana M.D.   On: 09/13/2014 15:53   Dg Fluoro Guide Cv Line-no Report  09/13/2014   CLINICAL DATA:    FLOURO GUIDE CV LINE  Fluoroscopy was utilized by the requesting physician.  No radiographic  interpretation.     Anti-infectives: Anti-infectives   Start     Dose/Rate Route Frequency Ordered Stop   09/13/14 1200  ceFAZolin (ANCEF) IVPB 2 g/50 mL premix     2 g 100 mL/hr over 30 Minutes Intravenous  Once 09/13/14 1146 09/13/14 1352      Assessment/Plan: s/p Procedure(s): DRAINAGE OF RIGHT MASTECTOMY SEROMA (Right) INSERTION PORT-A-CATH LEFT SUBCLAVIAN (Left) Discharge  LOS: 2 days    [name],Caprisha Bridgett S 09/15/2014

## 2014-09-15 NOTE — Progress Notes (Signed)
Discharge instructions gone over with patient. Home medications gone over. Follow up appointment is made. Diet, activity, and incisional care gone over. Power port information and cared sent with patient. My chart discussed. Patient verbalized understanding of instructions.  Family member is picking up patient's prescription for pain medicine. Script was sent home with another patient who is returning prescription to the unit. Dr. Marlou Starks, unit director, and assistant director are aware.

## 2014-09-16 ENCOUNTER — Other Ambulatory Visit (INDEPENDENT_AMBULATORY_CARE_PROVIDER_SITE_OTHER): Payer: Medicare Other

## 2014-09-16 ENCOUNTER — Telehealth: Payer: Self-pay | Admitting: Internal Medicine

## 2014-09-16 DIAGNOSIS — R6889 Other general symptoms and signs: Secondary | ICD-10-CM

## 2014-09-16 NOTE — Telephone Encounter (Signed)
Should go ahead and get flu and prevnar now then

## 2014-09-16 NOTE — Telephone Encounter (Signed)
Pt's daughter aware of recs.  Nothing further needed.

## 2014-09-16 NOTE — Telephone Encounter (Signed)
Called spoke with pt daughter Davy Pique. Wanting to know if pt can get flu vaccine and PNA vaccine. Pt last PNA vaccine was 2010. She was told by PCP it was fine burt they are wanting the okay from [facility]. Pt is going to start chemo on Monday. Please advise MW thanks

## 2014-09-16 NOTE — Progress Notes (Signed)
Lab only 

## 2014-09-17 ENCOUNTER — Ambulatory Visit (INDEPENDENT_AMBULATORY_CARE_PROVIDER_SITE_OTHER): Payer: Medicare Other | Admitting: *Deleted

## 2014-09-17 DIAGNOSIS — Z23 Encounter for immunization: Secondary | ICD-10-CM

## 2014-09-17 LAB — THYROID PANEL WITH TSH
Free Thyroxine Index: 3.2 (ref 1.2–4.9)
T3 UPTAKE RATIO: 29 % (ref 24–39)
T4 TOTAL: 11.1 ug/dL (ref 4.5–12.0)
TSH: 1.54 u[IU]/mL (ref 0.450–4.500)

## 2014-09-17 NOTE — Progress Notes (Signed)
Patient ID: [name], female   DOB: May 16, 1941, 73 y.o.   MRN: [id number] prevnar and influenza given and tolerated well

## 2014-09-17 NOTE — Patient Instructions (Signed)
Pneumococcal Conjugate Vaccine: What You Need to Know Your doctor recommends that you, or your child, get a dose of PCV13 today. 1. Why get vaccinated? Pneumococcal conjugate vaccine (called PCV13 or Prevnar 13) is recommended to protect infants and toddlers, and some older children and adults with certain health conditions, from pneumococcal disease. Pneumococcal disease is caused by infection with Streptococcus pneumoniae bacteria. These bacteria can spread from person to person through close contact. Pneumococcal disease can lead to severe health problems, including pneumonia, blood infections, and meningitis. Meningitis is an infection of the covering of the brain. Pneumococcal meningitis is fairly rare (less than 1 case per 100,000 people each year), but it leads to other health problems, including deafness and brain damage. In children, it is fatal in about 1 case out of 10. Children younger than two are at higher risk for serious disease than older children. People with certain medical conditions, people over age 84, and cigarette smokers are also at higher risk. Before vaccine, pneumococcal infections caused many problems each year in the [place] in children younger than 5, including:  more than 700 cases of meningitis,  13,000 blood infections,  about 5 million ear infections, and  about 200 deaths. About 4,000 adults still die each year because of pneumococcal infections. Pneumococcal infections can be hard to treat because some strains are resistant to antibiotics. This makes prevention through vaccination even more important. 2. PCV13 vaccine There are more than 90 types of pneumococcal bacteria. PCV13 protects against 13 of them. These 13 strains cause most severe infections in children and about half of infections in adults.  PCV13 is routinely given to children at 2, 4, 6, and 51-49 months of age. Children in this age range are at greatest risk for serious diseases caused  by pneumococcal infection. PCV13 vaccine may also be recommended for some older children or adults. Your doctor can give you details. A second type of pneumococcal vaccine, called PPSV23, may also be given to some children and adults, including anyone over age 53. There is a separate Vaccine Information Statement for this vaccine. 3. Precautions  Anyone who has ever had a life-threatening allergic reaction to a dose of this vaccine, to an earlier pneumococcal vaccine called PCV7 (or Prevnar), or to any vaccine containing diphtheria toxoid (for example, DTaP), should not get PCV13. Anyone with a severe allergy to any component of PCV13 should not get the vaccine. Tell your doctor if the person being vaccinated has any severe allergies. If the person scheduled for vaccination is sick, your doctor might decide to reschedule the shot on another day. Your doctor can give you more information about any of these precautions. 4. What are the risks of PCV13 vaccine?  With any medicine, including vaccines, there is a chance of side effects. These are usually mild and go away on their own, but serious reactions are also possible. Reported problems associated with PCV13 vary by dose and age, but generally:  About half of children became drowsy after the shot, had a temporary loss of appetite, or had redness or tenderness where the shot was given.  About 1 out of 3 had swelling where the shot was given.  About 1 out of 3 had a mild fever, and about 1 in 20 had a higher fever (over 102.69F).  Up to about 8 out of 10 became fussy or irritable. Adults receiving the vaccine have reported redness, pain, and swelling where the shot was given. Mild fever, fatigue, headache, chills, or  muscle pain have also been reported. Life-threatening allergic reactions from any vaccine are very rare. 5. What if there is a serious reaction? What should I look for?  Look for anything that concerns you, such as signs of a  severe allergic reaction, very high fever, or behavior changes. Signs of a severe allergic reaction can include hives, swelling of the face and throat, difficulty breathing, a fast heartbeat, dizziness, and weakness. These would start a few minutes to a few hours after the vaccination. What should I do?  If you think it is a severe allergic reaction or other emergency that can't wait, call 9-1-1 or get the person to the nearest hospital. Otherwise, call your doctor.  Afterward, the reaction should be reported to the Vaccine Adverse Event Reporting System (VAERS). Your doctor might file this report, or you can do it yourself through the VAERS web site at www.vaers.SamedayNews.es, or by calling 559-141-0005. VAERS is only for reporting reactions. They do not give medical advice. 6. The National Vaccine Injury Compensation Program The [employer] Vaccine Injury Compensation Program (VICP) is a federal program that was created to compensate people who may have been injured by certain vaccines. Persons who believe they may have been injured by a vaccine can learn about the program and about filing a claim by calling 870-211-8100 or visiting the [place] website at GoldCloset.com.ee. 7. How can I learn more?  Ask your doctor.  Call your local or state health department.  Contact the Centers for Disease Control and Prevention (CDC):  Call 801-042-0417 (1-800-CDC-INFO) or  Visit CDC's website at http://hunter.com/ CDC PCV13 Vaccine VIS (Interim) (01/23/12) Document Released: 09/09/2006 Document Revised: 03/29/2014 Document Reviewed: 01/01/2014 [facility] Patient Information 2015 [place], [name]. This information is not intended to replace advice given to you by your health care provider. Make sure you discuss any questions you have with your health care provider.

## 2014-09-25 ENCOUNTER — Emergency Department (HOSPITAL_COMMUNITY): Payer: Medicare Other

## 2014-09-25 ENCOUNTER — Emergency Department (HOSPITAL_COMMUNITY)
Admission: EM | Admit: 2014-09-25 | Discharge: 2014-09-25 | Disposition: A | Discharge status: Home / Self Care | Payer: Medicare Other | Attending: Emergency Medicine | Admitting: Emergency Medicine

## 2014-09-25 ENCOUNTER — Encounter (HOSPITAL_COMMUNITY): Payer: Self-pay | Admitting: Emergency Medicine

## 2014-09-25 DIAGNOSIS — Z853 Personal history of malignant neoplasm of breast: Secondary | ICD-10-CM | POA: Insufficient documentation

## 2014-09-25 DIAGNOSIS — Z79899 Other long term (current) drug therapy: Secondary | ICD-10-CM | POA: Diagnosis not present

## 2014-09-25 DIAGNOSIS — I10 Essential (primary) hypertension: Secondary | ICD-10-CM | POA: Insufficient documentation

## 2014-09-25 DIAGNOSIS — Z85118 Personal history of other malignant neoplasm of bronchus and lung: Secondary | ICD-10-CM | POA: Insufficient documentation

## 2014-09-25 DIAGNOSIS — F329 Major depressive disorder, single episode, unspecified: Secondary | ICD-10-CM | POA: Diagnosis not present

## 2014-09-25 DIAGNOSIS — R400 Somnolence: Secondary | ICD-10-CM | POA: Diagnosis present

## 2014-09-25 DIAGNOSIS — Z720 Tobacco use: Secondary | ICD-10-CM | POA: Diagnosis not present

## 2014-09-25 DIAGNOSIS — N390 Urinary tract infection, site not specified: Secondary | ICD-10-CM | POA: Diagnosis not present

## 2014-09-25 DIAGNOSIS — H409 Unspecified glaucoma: Secondary | ICD-10-CM | POA: Diagnosis not present

## 2014-09-25 DIAGNOSIS — Z7982 Long term (current) use of aspirin: Secondary | ICD-10-CM | POA: Diagnosis not present

## 2014-09-25 DIAGNOSIS — Z8673 Personal history of transient ischemic attack (TIA), and cerebral infarction without residual deficits: Secondary | ICD-10-CM | POA: Insufficient documentation

## 2014-09-25 DIAGNOSIS — R64 Cachexia: Secondary | ICD-10-CM | POA: Diagnosis not present

## 2014-09-25 DIAGNOSIS — W1830XA Fall on same level, unspecified, initial encounter: Secondary | ICD-10-CM | POA: Diagnosis not present

## 2014-09-25 DIAGNOSIS — Z87828 Personal history of other (healed) physical injury and trauma: Secondary | ICD-10-CM | POA: Diagnosis not present

## 2014-09-25 DIAGNOSIS — R52 Pain, unspecified: Secondary | ICD-10-CM

## 2014-09-25 DIAGNOSIS — E039 Hypothyroidism, unspecified: Secondary | ICD-10-CM | POA: Diagnosis not present

## 2014-09-25 DIAGNOSIS — Y939 Activity, unspecified: Secondary | ICD-10-CM | POA: Diagnosis not present

## 2014-09-25 DIAGNOSIS — S9001XA Contusion of right ankle, initial encounter: Secondary | ICD-10-CM | POA: Insufficient documentation

## 2014-09-25 DIAGNOSIS — F419 Anxiety disorder, unspecified: Secondary | ICD-10-CM | POA: Insufficient documentation

## 2014-09-25 DIAGNOSIS — Y929 Unspecified place or not applicable: Secondary | ICD-10-CM | POA: Diagnosis not present

## 2014-09-25 DIAGNOSIS — W19XXXA Unspecified fall, initial encounter: Secondary | ICD-10-CM

## 2014-09-25 LAB — COMPREHENSIVE METABOLIC PANEL
ALBUMIN: 3.5 g/dL (ref 3.5–5.2)
ALT: 16 U/L (ref 0–35)
AST: 17 U/L (ref 0–37)
Alkaline Phosphatase: 102 U/L (ref 39–117)
Anion gap: 11 (ref 5–15)
BUN: 19 mg/dL (ref 6–23)
CALCIUM: 8.9 mg/dL (ref 8.4–10.5)
CHLORIDE: 93 meq/L — AB (ref 96–112)
CO2: 28 mEq/L (ref 19–32)
CREATININE: 0.75 mg/dL (ref 0.50–1.10)
GFR calc Af Amer: >90 mL/min (ref >90)
GFR calc non Af Amer: 83 mL/min — ABNORMAL LOW (ref >90)
Glucose, Bld: 101 mg/dL — ABNORMAL HIGH (ref 70–99)
Potassium: 4.2 mEq/L (ref 3.7–5.3)
Sodium: 132 mEq/L — ABNORMAL LOW (ref 137–147)
Total Bilirubin: 0.7 mg/dL (ref 0.3–1.2)
Total Protein: 6.7 g/dL (ref 6.0–8.3)

## 2014-09-25 LAB — CBC WITH DIFFERENTIAL/PLATELET
BASOS ABS: 0 10*3/uL (ref 0.0–0.1)
Basophils Relative: 0 % (ref 0–1)
Eosinophils Absolute: 0 10*3/uL (ref 0.0–0.7)
Eosinophils Relative: 0 % (ref 0–5)
HCT: 38.4 % (ref 36.0–46.0)
Hemoglobin: 13.1 g/dL (ref 12.0–15.0)
Lymphocytes Relative: 8 % — ABNORMAL LOW (ref 12–46)
Lymphs Abs: 2.3 10*3/uL (ref 0.7–4.0)
MCH: 31.5 pg (ref 26.0–34.0)
MCHC: 34.1 g/dL (ref 30.0–36.0)
MCV: 92.3 fL (ref 78.0–100.0)
MONO ABS: 0.3 10*3/uL (ref 0.1–1.0)
MONOS PCT: 1 % — AB (ref 3–12)
NEUTROS PCT: 91 % — AB (ref 43–77)
Neutro Abs: 25.6 10*3/uL — ABNORMAL HIGH (ref 1.7–7.7)
PLATELETS: 206 10*3/uL (ref 150–400)
RBC: 4.16 MIL/uL (ref 3.87–5.11)
RDW: 13 % (ref 11.5–15.5)
WBC: 28.2 10*3/uL — AB (ref 4.0–10.5)

## 2014-09-25 LAB — URINE MICROSCOPIC-ADD ON

## 2014-09-25 LAB — URINALYSIS, ROUTINE W REFLEX MICROSCOPIC
BILIRUBIN URINE: NEGATIVE
Glucose, UA: NEGATIVE mg/dL
HGB URINE DIPSTICK: NEGATIVE
Ketones, ur: NEGATIVE mg/dL
NITRITE: NEGATIVE
PROTEIN: NEGATIVE mg/dL
Specific Gravity, Urine: 1.02 (ref 1.005–1.030)
UROBILINOGEN UA: 1 mg/dL (ref 0.0–1.0)
pH: 6.5 (ref 5.0–8.0)

## 2014-09-25 LAB — TROPONIN I

## 2014-09-25 MED ORDER — CEPHALEXIN 500 MG PO CAPS: 500.0000 mg | ORAL_CAPSULE | Freq: Four times a day (QID) | ORAL | Status: DC

## 2014-09-25 NOTE — ED Notes (Signed)
Pt alert & oriented x4, stable gait. Patient given discharge instructions, paperwork & prescription(s). Patient verbalized understanding. Pt left department by wheelchair w/ no further questions.

## 2014-09-25 NOTE — Discharge Instructions (Signed)
Take magnesium citrate for constipation.  You can get it at the pharmacy without a prescription.  Follow up with your md next week.
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
## 2014-09-25 NOTE — ED Notes (Signed)
Pt states she got hot & sweaty while trying to have a bowel movement & passed out. Pt started chemo treatment this week. Cancer of lung & liver.

## 2014-09-25 NOTE — ED Notes (Signed)
Pt arrived from home by EMS. Reported pt was trying to use the restroom & family found her in the floor. Pt states has not had a bowel movement in 2 weeks.

## 2014-09-27 ENCOUNTER — Emergency Department (HOSPITAL_COMMUNITY)
Admission: EM | Admit: 2014-09-27 | Discharge: 2014-09-27 | Disposition: A | Discharge status: Home / Self Care | Payer: Medicare Other | Attending: Emergency Medicine | Admitting: Emergency Medicine

## 2014-09-27 ENCOUNTER — Encounter (HOSPITAL_COMMUNITY): Payer: Self-pay

## 2014-09-27 ENCOUNTER — Emergency Department (HOSPITAL_COMMUNITY): Payer: Medicare Other

## 2014-09-27 DIAGNOSIS — F329 Major depressive disorder, single episode, unspecified: Secondary | ICD-10-CM | POA: Insufficient documentation

## 2014-09-27 DIAGNOSIS — E039 Hypothyroidism, unspecified: Secondary | ICD-10-CM | POA: Diagnosis not present

## 2014-09-27 DIAGNOSIS — Z853 Personal history of malignant neoplasm of breast: Secondary | ICD-10-CM | POA: Insufficient documentation

## 2014-09-27 DIAGNOSIS — Z8673 Personal history of transient ischemic attack (TIA), and cerebral infarction without residual deficits: Secondary | ICD-10-CM | POA: Insufficient documentation

## 2014-09-27 DIAGNOSIS — Z720 Tobacco use: Secondary | ICD-10-CM | POA: Diagnosis not present

## 2014-09-27 DIAGNOSIS — R1030 Lower abdominal pain, unspecified: Secondary | ICD-10-CM

## 2014-09-27 DIAGNOSIS — Z79899 Other long term (current) drug therapy: Secondary | ICD-10-CM | POA: Diagnosis not present

## 2014-09-27 DIAGNOSIS — Z9889 Other specified postprocedural states: Secondary | ICD-10-CM | POA: Diagnosis not present

## 2014-09-27 DIAGNOSIS — Z87440 Personal history of urinary (tract) infections: Secondary | ICD-10-CM | POA: Diagnosis not present

## 2014-09-27 DIAGNOSIS — Z7982 Long term (current) use of aspirin: Secondary | ICD-10-CM | POA: Insufficient documentation

## 2014-09-27 DIAGNOSIS — Z9010 Acquired absence of unspecified breast and nipple: Secondary | ICD-10-CM | POA: Insufficient documentation

## 2014-09-27 DIAGNOSIS — Z5111 Encounter for antineoplastic chemotherapy: Secondary | ICD-10-CM | POA: Diagnosis not present

## 2014-09-27 DIAGNOSIS — I10 Essential (primary) hypertension: Secondary | ICD-10-CM | POA: Diagnosis not present

## 2014-09-27 DIAGNOSIS — Z85118 Personal history of other malignant neoplasm of bronchus and lung: Secondary | ICD-10-CM | POA: Diagnosis not present

## 2014-09-27 DIAGNOSIS — K5900 Constipation, unspecified: Secondary | ICD-10-CM | POA: Insufficient documentation

## 2014-09-27 DIAGNOSIS — R630 Anorexia: Secondary | ICD-10-CM | POA: Diagnosis not present

## 2014-09-27 DIAGNOSIS — Z87828 Personal history of other (healed) physical injury and trauma: Secondary | ICD-10-CM | POA: Insufficient documentation

## 2014-09-27 DIAGNOSIS — Z792 Long term (current) use of antibiotics: Secondary | ICD-10-CM | POA: Diagnosis not present

## 2014-09-27 DIAGNOSIS — K5641 Fecal impaction: Secondary | ICD-10-CM | POA: Diagnosis present

## 2014-09-27 DIAGNOSIS — F419 Anxiety disorder, unspecified: Secondary | ICD-10-CM | POA: Insufficient documentation

## 2014-09-27 LAB — CBC WITH DIFFERENTIAL/PLATELET
BASOS ABS: 0 10*3/uL (ref 0.0–0.1)
Basophils Relative: 1 % (ref 0–1)
Eosinophils Absolute: 0.1 10*3/uL (ref 0.0–0.7)
Eosinophils Relative: 1 % (ref 0–5)
HCT: 37.7 % (ref 36.0–46.0)
Hemoglobin: 13.1 g/dL (ref 12.0–15.0)
Lymphocytes Relative: 29 % (ref 12–46)
Lymphs Abs: 2 10*3/uL (ref 0.7–4.0)
MCH: 31.2 pg (ref 26.0–34.0)
MCHC: 34.7 g/dL (ref 30.0–36.0)
MCV: 89.8 fL (ref 78.0–100.0)
Monocytes Absolute: 0.1 10*3/uL (ref 0.1–1.0)
Monocytes Relative: 1 % — ABNORMAL LOW (ref 3–12)
NEUTROS ABS: 4.7 10*3/uL (ref 1.7–7.7)
Neutrophils Relative %: 68 % (ref 43–77)
PLATELETS: 163 10*3/uL (ref 150–400)
RBC: 4.2 MIL/uL (ref 3.87–5.11)
RDW: 12.7 % (ref 11.5–15.5)
WBC: 6.9 10*3/uL (ref 4.0–10.5)

## 2014-09-27 LAB — COMPREHENSIVE METABOLIC PANEL
ALT: 14 U/L (ref 0–35)
AST: 16 U/L (ref 0–37)
Albumin: 3.6 g/dL (ref 3.5–5.2)
Alkaline Phosphatase: 104 U/L (ref 39–117)
Anion gap: 14 (ref 5–15)
BILIRUBIN TOTAL: 0.9 mg/dL (ref 0.3–1.2)
BUN: 15 mg/dL (ref 6–23)
CHLORIDE: 89 meq/L — AB (ref 96–112)
CO2: 28 mEq/L (ref 19–32)
Calcium: 9 mg/dL (ref 8.4–10.5)
Creatinine, Ser: 0.66 mg/dL (ref 0.50–1.10)
GFR calc Af Amer: >90 mL/min (ref >90)
GFR calc non Af Amer: 86 mL/min — ABNORMAL LOW (ref >90)
Glucose, Bld: 92 mg/dL (ref 70–99)
Potassium: 4 mEq/L (ref 3.7–5.3)
SODIUM: 131 meq/L — AB (ref 137–147)
Total Protein: 6.8 g/dL (ref 6.0–8.3)

## 2014-09-27 LAB — URINALYSIS, ROUTINE W REFLEX MICROSCOPIC
Bilirubin Urine: NEGATIVE
GLUCOSE, UA: NEGATIVE mg/dL
Hgb urine dipstick: NEGATIVE
KETONES UR: NEGATIVE mg/dL
LEUKOCYTES UA: NEGATIVE
Nitrite: NEGATIVE
PH: 8 (ref 5.0–8.0)
Protein, ur: NEGATIVE mg/dL
Specific Gravity, Urine: 1.016 (ref 1.005–1.030)
Urobilinogen, UA: 0.2 mg/dL (ref 0.0–1.0)

## 2014-09-27 MED ORDER — FLEET ENEMA 7-19 GM/118ML RE ENEM: 1.0000 | ENEMA | Freq: Once | RECTAL | Status: DC

## 2014-09-27 MED ORDER — IOHEXOL 300 MG/ML  SOLN
80.0000 mL | Freq: Once | INTRAMUSCULAR | Status: AC | PRN
  Administered 2014-09-27: 80 mL via INTRAVENOUS

## 2014-09-27 MED ORDER — POLYETHYLENE GLYCOL 3350 17 G PO PACK: 17.0000 g | PACK | Freq: Two times a day (BID) | ORAL | Status: DC

## 2014-09-27 MED ORDER — MAGNESIUM CITRATE PO SOLN: 1.0000 | Freq: Once | ORAL | Status: DC

## 2014-09-27 MED ORDER — SODIUM CHLORIDE 0.9 % IV BOLUS (SEPSIS)
500.0000 mL | Freq: Once | INTRAVENOUS | Status: AC
  Administered 2014-09-27: 500 mL via INTRAVENOUS

## 2014-09-27 MED ORDER — IOHEXOL 300 MG/ML  SOLN: 25.0000 mL | Freq: Once | INTRAMUSCULAR | Status: DC | PRN

## 2014-09-27 NOTE — ED Notes (Signed)
Patient transported to CT 

## 2014-09-27 NOTE — Discharge Instructions (Signed)
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
## 2014-09-27 NOTE — ED Notes (Signed)
Pt. Presents to ED with complaint of fecal impaction. Pt. Was seen Saturday night at [facility]. Pt. States she has a UTI and was given keflex for that on Saturday. Today pt. Complains of inability to urinate. States that she dribbled this AM. Family states that it has been longer than two weeks since the patient has had a BM.

## 2014-09-28 DIAGNOSIS — K5900 Constipation, unspecified: Secondary | ICD-10-CM | POA: Insufficient documentation

## 2014-09-28 DIAGNOSIS — D709 Neutropenia, unspecified: Secondary | ICD-10-CM | POA: Insufficient documentation

## 2014-09-28 LAB — URINE CULTURE

## 2014-09-29 ENCOUNTER — Other Ambulatory Visit: Payer: Medicare Other

## 2014-10-07 ENCOUNTER — Other Ambulatory Visit: Payer: Self-pay | Admitting: Nurse Practitioner

## 2014-11-02 ENCOUNTER — Telehealth: Payer: Self-pay | Admitting: Nurse Practitioner

## 2014-11-02 DIAGNOSIS — I10 Essential (primary) hypertension: Secondary | ICD-10-CM

## 2014-11-02 DIAGNOSIS — F411 Generalized anxiety disorder: Secondary | ICD-10-CM

## 2014-11-03 ENCOUNTER — Telehealth: Payer: Self-pay | Admitting: *Deleted

## 2014-11-03 MED ORDER — LISINOPRIL 40 MG PO TABS: 40.0000 mg | ORAL_TABLET | Freq: Every day | ORAL | Status: DC

## 2014-11-03 MED ORDER — ALPRAZOLAM 1 MG PO TABS: 1.0000 mg | ORAL_TABLET | Freq: Three times a day (TID) | ORAL | Status: DC | PRN

## 2014-11-03 NOTE — Telephone Encounter (Signed)
Patient has appointment on 1/6 with you can we refill her alprazolam and lisinopril please. Patient is going through Chemo at this time.

## 2014-11-03 NOTE — Telephone Encounter (Signed)
Aware, xanax called to pharmacy.

## 2014-11-03 NOTE — Telephone Encounter (Signed)
Please call in xanax 1mg  1 po TID #90 wiith 2 refills

## 2014-11-22 ENCOUNTER — Ambulatory Visit: Payer: Medicare Other | Admitting: Nurse Practitioner

## 2014-11-30 DIAGNOSIS — C800 Disseminated malignant neoplasm, unspecified: Secondary | ICD-10-CM | POA: Diagnosis not present

## 2014-11-30 DIAGNOSIS — D539 Nutritional anemia, unspecified: Secondary | ICD-10-CM | POA: Diagnosis not present

## 2014-11-30 DIAGNOSIS — C3491 Malignant neoplasm of unspecified part of right bronchus or lung: Secondary | ICD-10-CM | POA: Diagnosis not present

## 2014-11-30 DIAGNOSIS — D6481 Anemia due to antineoplastic chemotherapy: Secondary | ICD-10-CM | POA: Diagnosis not present

## 2014-11-30 DIAGNOSIS — T451X5A Adverse effect of antineoplastic and immunosuppressive drugs, initial encounter: Secondary | ICD-10-CM | POA: Diagnosis not present

## 2014-11-30 DIAGNOSIS — R531 Weakness: Secondary | ICD-10-CM | POA: Diagnosis not present

## 2014-11-30 DIAGNOSIS — R0602 Shortness of breath: Secondary | ICD-10-CM | POA: Diagnosis not present

## 2014-11-30 DIAGNOSIS — D701 Agranulocytosis secondary to cancer chemotherapy: Secondary | ICD-10-CM | POA: Diagnosis not present

## 2014-11-30 DIAGNOSIS — R42 Dizziness and giddiness: Secondary | ICD-10-CM | POA: Diagnosis not present

## 2014-12-01 ENCOUNTER — Ambulatory Visit: Payer: Medicare Other | Admitting: Nurse Practitioner

## 2014-12-01 DIAGNOSIS — R0600 Dyspnea, unspecified: Secondary | ICD-10-CM | POA: Diagnosis not present

## 2014-12-01 DIAGNOSIS — D539 Nutritional anemia, unspecified: Secondary | ICD-10-CM | POA: Diagnosis not present

## 2014-12-01 DIAGNOSIS — J449 Chronic obstructive pulmonary disease, unspecified: Secondary | ICD-10-CM | POA: Diagnosis not present

## 2014-12-02 DIAGNOSIS — D6481 Anemia due to antineoplastic chemotherapy: Secondary | ICD-10-CM | POA: Diagnosis not present

## 2014-12-02 DIAGNOSIS — C3491 Malignant neoplasm of unspecified part of right bronchus or lung: Secondary | ICD-10-CM | POA: Diagnosis not present

## 2014-12-02 DIAGNOSIS — C50911 Malignant neoplasm of unspecified site of right female breast: Secondary | ICD-10-CM | POA: Diagnosis not present

## 2014-12-06 DIAGNOSIS — C50911 Malignant neoplasm of unspecified site of right female breast: Secondary | ICD-10-CM | POA: Diagnosis not present

## 2014-12-06 DIAGNOSIS — T451X5A Adverse effect of antineoplastic and immunosuppressive drugs, initial encounter: Secondary | ICD-10-CM | POA: Diagnosis not present

## 2014-12-06 DIAGNOSIS — D61810 Antineoplastic chemotherapy induced pancytopenia: Secondary | ICD-10-CM | POA: Diagnosis not present

## 2014-12-08 ENCOUNTER — Other Ambulatory Visit: Payer: Self-pay | Admitting: Family Medicine

## 2014-12-08 DIAGNOSIS — D696 Thrombocytopenia, unspecified: Secondary | ICD-10-CM | POA: Diagnosis not present

## 2014-12-13 DIAGNOSIS — C800 Disseminated malignant neoplasm, unspecified: Secondary | ICD-10-CM | POA: Diagnosis not present

## 2014-12-13 DIAGNOSIS — C3490 Malignant neoplasm of unspecified part of unspecified bronchus or lung: Secondary | ICD-10-CM | POA: Diagnosis not present

## 2014-12-13 DIAGNOSIS — C50911 Malignant neoplasm of unspecified site of right female breast: Secondary | ICD-10-CM | POA: Diagnosis not present

## 2014-12-13 DIAGNOSIS — C3491 Malignant neoplasm of unspecified part of right bronchus or lung: Secondary | ICD-10-CM | POA: Diagnosis not present

## 2014-12-13 DIAGNOSIS — R112 Nausea with vomiting, unspecified: Secondary | ICD-10-CM | POA: Diagnosis not present

## 2014-12-13 DIAGNOSIS — D701 Agranulocytosis secondary to cancer chemotherapy: Secondary | ICD-10-CM | POA: Diagnosis not present

## 2014-12-14 DIAGNOSIS — R112 Nausea with vomiting, unspecified: Secondary | ICD-10-CM | POA: Diagnosis not present

## 2014-12-14 DIAGNOSIS — C3491 Malignant neoplasm of unspecified part of right bronchus or lung: Secondary | ICD-10-CM | POA: Diagnosis not present

## 2014-12-14 DIAGNOSIS — C800 Disseminated malignant neoplasm, unspecified: Secondary | ICD-10-CM | POA: Diagnosis not present

## 2014-12-14 DIAGNOSIS — D701 Agranulocytosis secondary to cancer chemotherapy: Secondary | ICD-10-CM | POA: Diagnosis not present

## 2014-12-15 DIAGNOSIS — R112 Nausea with vomiting, unspecified: Secondary | ICD-10-CM | POA: Diagnosis not present

## 2014-12-15 DIAGNOSIS — C3491 Malignant neoplasm of unspecified part of right bronchus or lung: Secondary | ICD-10-CM | POA: Diagnosis not present

## 2014-12-15 DIAGNOSIS — D701 Agranulocytosis secondary to cancer chemotherapy: Secondary | ICD-10-CM | POA: Diagnosis not present

## 2014-12-15 DIAGNOSIS — C800 Disseminated malignant neoplasm, unspecified: Secondary | ICD-10-CM | POA: Diagnosis not present

## 2014-12-16 DIAGNOSIS — C3491 Malignant neoplasm of unspecified part of right bronchus or lung: Secondary | ICD-10-CM | POA: Diagnosis not present

## 2014-12-16 DIAGNOSIS — C800 Disseminated malignant neoplasm, unspecified: Secondary | ICD-10-CM | POA: Diagnosis not present

## 2014-12-16 DIAGNOSIS — D701 Agranulocytosis secondary to cancer chemotherapy: Secondary | ICD-10-CM | POA: Diagnosis not present

## 2014-12-20 DIAGNOSIS — C800 Disseminated malignant neoplasm, unspecified: Secondary | ICD-10-CM | POA: Diagnosis not present

## 2014-12-20 DIAGNOSIS — C3491 Malignant neoplasm of unspecified part of right bronchus or lung: Secondary | ICD-10-CM | POA: Diagnosis not present

## 2014-12-20 DIAGNOSIS — R0602 Shortness of breath: Secondary | ICD-10-CM | POA: Diagnosis not present

## 2014-12-20 DIAGNOSIS — D6481 Anemia due to antineoplastic chemotherapy: Secondary | ICD-10-CM | POA: Diagnosis not present

## 2014-12-22 DIAGNOSIS — R0602 Shortness of breath: Secondary | ICD-10-CM | POA: Diagnosis not present

## 2014-12-22 DIAGNOSIS — D649 Anemia, unspecified: Secondary | ICD-10-CM | POA: Diagnosis not present

## 2014-12-22 DIAGNOSIS — R531 Weakness: Secondary | ICD-10-CM | POA: Diagnosis not present

## 2014-12-23 DIAGNOSIS — R0602 Shortness of breath: Secondary | ICD-10-CM | POA: Diagnosis not present

## 2014-12-23 DIAGNOSIS — R531 Weakness: Secondary | ICD-10-CM | POA: Diagnosis not present

## 2014-12-23 DIAGNOSIS — D649 Anemia, unspecified: Secondary | ICD-10-CM | POA: Diagnosis not present

## 2014-12-28 DIAGNOSIS — R300 Dysuria: Secondary | ICD-10-CM | POA: Diagnosis not present

## 2014-12-28 DIAGNOSIS — D696 Thrombocytopenia, unspecified: Secondary | ICD-10-CM | POA: Diagnosis not present

## 2014-12-28 DIAGNOSIS — C800 Disseminated malignant neoplasm, unspecified: Secondary | ICD-10-CM | POA: Diagnosis not present

## 2014-12-28 DIAGNOSIS — C3491 Malignant neoplasm of unspecified part of right bronchus or lung: Secondary | ICD-10-CM | POA: Diagnosis not present

## 2014-12-28 DIAGNOSIS — C50211 Malignant neoplasm of upper-inner quadrant of right female breast: Secondary | ICD-10-CM | POA: Diagnosis not present

## 2015-01-03 DIAGNOSIS — C50911 Malignant neoplasm of unspecified site of right female breast: Secondary | ICD-10-CM | POA: Diagnosis not present

## 2015-01-03 DIAGNOSIS — C3491 Malignant neoplasm of unspecified part of right bronchus or lung: Secondary | ICD-10-CM | POA: Diagnosis not present

## 2015-01-03 DIAGNOSIS — C800 Disseminated malignant neoplasm, unspecified: Secondary | ICD-10-CM | POA: Diagnosis not present

## 2015-01-03 DIAGNOSIS — T451X5A Adverse effect of antineoplastic and immunosuppressive drugs, initial encounter: Secondary | ICD-10-CM | POA: Diagnosis not present

## 2015-01-03 DIAGNOSIS — R112 Nausea with vomiting, unspecified: Secondary | ICD-10-CM | POA: Diagnosis not present

## 2015-01-03 DIAGNOSIS — D701 Agranulocytosis secondary to cancer chemotherapy: Secondary | ICD-10-CM | POA: Diagnosis not present

## 2015-01-04 DIAGNOSIS — D701 Agranulocytosis secondary to cancer chemotherapy: Secondary | ICD-10-CM | POA: Diagnosis not present

## 2015-01-04 DIAGNOSIS — C3491 Malignant neoplasm of unspecified part of right bronchus or lung: Secondary | ICD-10-CM | POA: Diagnosis not present

## 2015-01-04 DIAGNOSIS — R112 Nausea with vomiting, unspecified: Secondary | ICD-10-CM | POA: Diagnosis not present

## 2015-01-04 DIAGNOSIS — C800 Disseminated malignant neoplasm, unspecified: Secondary | ICD-10-CM | POA: Diagnosis not present

## 2015-01-05 DIAGNOSIS — R112 Nausea with vomiting, unspecified: Secondary | ICD-10-CM | POA: Diagnosis not present

## 2015-01-05 DIAGNOSIS — D701 Agranulocytosis secondary to cancer chemotherapy: Secondary | ICD-10-CM | POA: Diagnosis not present

## 2015-01-05 DIAGNOSIS — C800 Disseminated malignant neoplasm, unspecified: Secondary | ICD-10-CM | POA: Diagnosis not present

## 2015-01-05 DIAGNOSIS — C3491 Malignant neoplasm of unspecified part of right bronchus or lung: Secondary | ICD-10-CM | POA: Diagnosis not present

## 2015-01-06 ENCOUNTER — Other Ambulatory Visit: Payer: Self-pay

## 2015-01-06 DIAGNOSIS — C800 Disseminated malignant neoplasm, unspecified: Secondary | ICD-10-CM | POA: Diagnosis not present

## 2015-01-06 DIAGNOSIS — C3491 Malignant neoplasm of unspecified part of right bronchus or lung: Secondary | ICD-10-CM | POA: Diagnosis not present

## 2015-01-06 DIAGNOSIS — D701 Agranulocytosis secondary to cancer chemotherapy: Secondary | ICD-10-CM | POA: Diagnosis not present

## 2015-01-06 MED ORDER — LEVOTHYROXINE SODIUM 112 MCG PO TABS: 112.0000 ug | ORAL_TABLET | Freq: Every day | ORAL | Status: DC

## 2015-01-06 MED ORDER — LISINOPRIL 40 MG PO TABS: 40.0000 mg | ORAL_TABLET | Freq: Every day | ORAL | Status: DC

## 2015-01-12 DIAGNOSIS — D6481 Anemia due to antineoplastic chemotherapy: Secondary | ICD-10-CM | POA: Diagnosis not present

## 2015-01-12 DIAGNOSIS — C3490 Malignant neoplasm of unspecified part of unspecified bronchus or lung: Secondary | ICD-10-CM | POA: Diagnosis not present

## 2015-01-12 DIAGNOSIS — C3491 Malignant neoplasm of unspecified part of right bronchus or lung: Secondary | ICD-10-CM | POA: Diagnosis not present

## 2015-01-12 DIAGNOSIS — C800 Disseminated malignant neoplasm, unspecified: Secondary | ICD-10-CM | POA: Diagnosis not present

## 2015-01-24 DIAGNOSIS — C7951 Secondary malignant neoplasm of bone: Secondary | ICD-10-CM | POA: Diagnosis not present

## 2015-01-24 DIAGNOSIS — C50919 Malignant neoplasm of unspecified site of unspecified female breast: Secondary | ICD-10-CM | POA: Diagnosis not present

## 2015-01-24 DIAGNOSIS — C3491 Malignant neoplasm of unspecified part of right bronchus or lung: Secondary | ICD-10-CM | POA: Diagnosis not present

## 2015-01-24 DIAGNOSIS — C800 Disseminated malignant neoplasm, unspecified: Secondary | ICD-10-CM | POA: Diagnosis not present

## 2015-01-24 DIAGNOSIS — C7800 Secondary malignant neoplasm of unspecified lung: Secondary | ICD-10-CM | POA: Diagnosis not present

## 2015-01-24 DIAGNOSIS — C50211 Malignant neoplasm of upper-inner quadrant of right female breast: Secondary | ICD-10-CM | POA: Diagnosis not present

## 2015-01-24 DIAGNOSIS — E279 Disorder of adrenal gland, unspecified: Secondary | ICD-10-CM | POA: Diagnosis not present

## 2015-01-25 DIAGNOSIS — D6481 Anemia due to antineoplastic chemotherapy: Secondary | ICD-10-CM | POA: Diagnosis not present

## 2015-01-25 DIAGNOSIS — D696 Thrombocytopenia, unspecified: Secondary | ICD-10-CM | POA: Diagnosis not present

## 2015-01-25 DIAGNOSIS — C800 Disseminated malignant neoplasm, unspecified: Secondary | ICD-10-CM | POA: Diagnosis not present

## 2015-01-25 DIAGNOSIS — C50911 Malignant neoplasm of unspecified site of right female breast: Secondary | ICD-10-CM | POA: Diagnosis not present

## 2015-01-25 DIAGNOSIS — C3491 Malignant neoplasm of unspecified part of right bronchus or lung: Secondary | ICD-10-CM | POA: Diagnosis not present

## 2015-01-26 DIAGNOSIS — C7951 Secondary malignant neoplasm of bone: Secondary | ICD-10-CM | POA: Insufficient documentation

## 2015-01-29 ENCOUNTER — Other Ambulatory Visit: Payer: Self-pay | Admitting: Nurse Practitioner

## 2015-02-03 DIAGNOSIS — C7951 Secondary malignant neoplasm of bone: Secondary | ICD-10-CM | POA: Diagnosis not present

## 2015-02-03 DIAGNOSIS — C800 Disseminated malignant neoplasm, unspecified: Secondary | ICD-10-CM | POA: Diagnosis not present

## 2015-02-03 DIAGNOSIS — D6481 Anemia due to antineoplastic chemotherapy: Secondary | ICD-10-CM | POA: Diagnosis not present

## 2015-02-03 DIAGNOSIS — C3491 Malignant neoplasm of unspecified part of right bronchus or lung: Secondary | ICD-10-CM | POA: Diagnosis not present

## 2015-02-03 DIAGNOSIS — C3490 Malignant neoplasm of unspecified part of unspecified bronchus or lung: Secondary | ICD-10-CM | POA: Diagnosis not present

## 2015-02-07 ENCOUNTER — Telehealth: Payer: Self-pay | Admitting: Nurse Practitioner

## 2015-02-07 ENCOUNTER — Encounter: Payer: Self-pay | Admitting: Nurse Practitioner

## 2015-02-07 ENCOUNTER — Ambulatory Visit (INDEPENDENT_AMBULATORY_CARE_PROVIDER_SITE_OTHER): Payer: Medicare Other | Admitting: Nurse Practitioner

## 2015-02-07 VITALS — BP 138/62 | HR 79 | Temp 97.2°F | Ht 67.0 in | Wt 147.0 lb

## 2015-02-07 DIAGNOSIS — J0110 Acute frontal sinusitis, unspecified: Secondary | ICD-10-CM | POA: Diagnosis not present

## 2015-02-07 MED ORDER — AZITHROMYCIN 250 MG PO TABS: ORAL_TABLET | ORAL | Status: DC

## 2015-02-07 NOTE — Patient Instructions (Signed)
Sinusitis Sinusitis is redness, soreness, and inflammation of the paranasal sinuses. Paranasal sinuses are air pockets within the bones of your face (beneath the eyes, the middle of the forehead, or above the eyes). In healthy paranasal sinuses, mucus is able to drain out, and air is able to circulate through them by way of your nose. However, when your paranasal sinuses are inflamed, mucus and air can become trapped. This can allow bacteria and other germs to grow and cause infection. Sinusitis can develop quickly and last only a short time (acute) or continue over a long period (chronic). Sinusitis that lasts for more than 12 weeks is considered chronic.  CAUSES  Causes of sinusitis include:  Allergies.  Structural abnormalities, such as displacement of the cartilage that separates your nostrils (deviated septum), which can decrease the air flow through your nose and sinuses and affect sinus drainage.  Functional abnormalities, such as when the small hairs (cilia) that line your sinuses and help remove mucus do not work properly or are not present. SIGNS AND SYMPTOMS  Symptoms of acute and chronic sinusitis are the same. The primary symptoms are pain and pressure around the affected sinuses. Other symptoms include:  Upper toothache.  Earache.  Headache.  Bad breath.  Decreased sense of smell and taste.  A cough, which worsens when you are lying flat.  Fatigue.  Fever.  Thick drainage from your nose, which often is green and may contain pus (purulent).  Swelling and warmth over the affected sinuses. DIAGNOSIS  Your health care provider will perform a physical exam. During the exam, your health care provider may:  Look in your nose for signs of abnormal growths in your nostrils (nasal polyps).  Tap over the affected sinus to check for signs of infection.  View the inside of your sinuses (endoscopy) using an imaging device that has a light attached (endoscope). If your health  care provider suspects that you have chronic sinusitis, one or more of the following tests may be recommended:  Allergy tests.  Nasal culture. A sample of mucus is taken from your nose, sent to a lab, and screened for bacteria.  Nasal cytology. A sample of mucus is taken from your nose and examined by your health care provider to determine if your sinusitis is related to an allergy. TREATMENT  Most cases of acute sinusitis are related to a viral infection and will resolve on their own within 10 days. Sometimes medicines are prescribed to help relieve symptoms (pain medicine, decongestants, nasal steroid sprays, or saline sprays).  However, for sinusitis related to a bacterial infection, your health care provider will prescribe antibiotic medicines. These are medicines that will help kill the bacteria causing the infection.  Rarely, sinusitis is caused by a fungal infection. In theses cases, your health care provider will prescribe antifungal medicine. For some cases of chronic sinusitis, surgery is needed. Generally, these are cases in which sinusitis recurs more than 3 times per year, despite other treatments. HOME CARE INSTRUCTIONS   Drink plenty of water. Water helps thin the mucus so your sinuses can drain more easily.  Use a humidifier.  Inhale steam 3 to 4 times a day (for example, sit in the bathroom with the shower running).  Apply a warm, moist washcloth to your face 3 to 4 times a day, or as directed by your health care provider.  Use saline nasal sprays to help moisten and clean your sinuses.  Take medicines only as directed by your health care provider.    If you were prescribed either an antibiotic or antifungal medicine, finish it all even if you start to feel better. SEEK IMMEDIATE MEDICAL CARE IF:  You have increasing pain or severe headaches.  You have nausea, vomiting, or drowsiness.  You have swelling around your face.  You have vision problems.  You have a stiff  neck.  You have difficulty breathing. MAKE SURE YOU:   Understand these instructions.  Will watch your condition.  Will get help right away if you are not doing well or get worse. Document Released: 11/12/2005 Document Revised: 03/29/2014 Document Reviewed: 11/27/2011 [facility] Patient Information 2015 [place], [place]. This information is not intended to replace advice given to you by your health care provider. Make sure you discuss any questions you have with your health care provider.  1. Take meds as prescribed 2. Use a cool mist humidifier especially during the winter months and when heat has been humid. 3. Use saline nose sprays frequently 4. Saline irrigations of the nose can be very helpful if done frequently.  * 4X daily for 1 week*  * Use of a nettie pot can be helpful with this. Follow directions with this* 5. Drink plenty of fluids 6. Keep thermostat turn down low 7.For any cough or congestion  Use plain Mucinex- regular strength or max strength is fine   * Children- consult with Pharmacist for dosing 8. For fever or aces or pains- take tylenol or ibuprofen appropriate for age and weight.  * for fevers greater than 101 orally you may alternate ibuprofen and tylenol every  3 hours.

## 2015-02-07 NOTE — Progress Notes (Signed)
  Subjective:     [name] is a 74 y.o. female who presents for evaluation of sinus pain. Symptoms include: congestion, cough, nasal congestion, post nasal drip and sinus pressure.headache and dizziness.  Onset of symptoms was 2 months ago. Symptoms have been gradually worsening since that time. Past history is significant for history of cancer with mets to lungs. . Patient is a non-smoker.  The following portions of the patient's history were reviewed and updated as appropriate: allergies, current medications, past family history, past medical history, past social history, past surgical history and problem list.  Review of Systems Pertinent items are noted in HPI.   Objective:    BP 138/62 mmHg  Pulse 79  Temp(Src) 97.2 F (36.2 C) (Oral)  Ht 5\' 7"  (1.702 m)  Wt 147 lb (66.679 kg)  BMI 23.02 kg/m2 General appearance: alert, cooperative and appears stated age Head: Normocephalic, without obvious abnormality, atraumatic frontal sinus pressure.  Eyes: conjunctivae/corneas clear. PERRL, EOM's intact. Fundi benign. Ears: normal TM's and external ear canals both ears Nose: Nares normal. Septum midline. Mucosa normal. No drainage or sinus tenderness. Throat: lips, mucosa, and tongue normal; teeth and gums normal Lungs: clear to auscultation bilaterally Heart: regular rate and rhythm, S1, S2 normal, no murmur, click, rub or gallop    Assessment:    Acute frontal sinusitis.   Plan:   Meds ordered this encounter  Medications  . azithromycin (ZITHROMAX) 250 MG tablet    Sig: Use as directed.    Dispense:  6 tablet    Refill:  0    1. Take meds as prescribed 2. Use a cool mist humidifier especially during the winter months and when heat has been humid. 3. Use saline nose sprays frequently 4. Saline irrigations of the nose can be very helpful if done frequently.  * 4X daily for 1 week*  * Use of a nettie pot can be helpful with this. Follow directions with this* 5. Drink  plenty of fluids 6. Keep thermostat turn down low 7.For any cough or congestion  Use plain Mucinex- regular strength or max strength is fine   * Children- consult with Pharmacist for dosing 8. For fever or aces or pains- take tylenol or ibuprofen appropriate for age and weight.  * for fevers greater than 101 orally you may alternate ibuprofen and tylenol every  3 hours.   Mary-Margaret Hassell Done, FNP

## 2015-02-07 NOTE — Telephone Encounter (Signed)
Detailed message left for patient to get debrox otc.

## 2015-02-07 NOTE — Telephone Encounter (Signed)
Appointment scheduled with Shelah Lewandowsky at 1:00 today. Patient aware.

## 2015-02-26 ENCOUNTER — Other Ambulatory Visit: Payer: Self-pay | Admitting: Nurse Practitioner

## 2015-02-26 MED ORDER — DILTIAZEM HCL ER COATED BEADS 180 MG PO CP24: ORAL_CAPSULE | ORAL | Status: DC

## 2015-02-26 NOTE — Telephone Encounter (Signed)
rx sent into pharmacy

## 2015-03-03 ENCOUNTER — Encounter: Payer: Self-pay | Admitting: Nurse Practitioner

## 2015-03-03 ENCOUNTER — Other Ambulatory Visit: Payer: Self-pay | Admitting: Nurse Practitioner

## 2015-03-03 ENCOUNTER — Ambulatory Visit (INDEPENDENT_AMBULATORY_CARE_PROVIDER_SITE_OTHER): Payer: Medicare Other

## 2015-03-03 ENCOUNTER — Ambulatory Visit (INDEPENDENT_AMBULATORY_CARE_PROVIDER_SITE_OTHER): Payer: Medicare Other | Admitting: Nurse Practitioner

## 2015-03-03 VITALS — BP 131/73 | HR 82 | Temp 98.7°F | Ht 67.0 in | Wt 143.0 lb

## 2015-03-03 DIAGNOSIS — F411 Generalized anxiety disorder: Secondary | ICD-10-CM

## 2015-03-03 DIAGNOSIS — E039 Hypothyroidism, unspecified: Secondary | ICD-10-CM

## 2015-03-03 DIAGNOSIS — F329 Major depressive disorder, single episode, unspecified: Secondary | ICD-10-CM

## 2015-03-03 DIAGNOSIS — E876 Hypokalemia: Secondary | ICD-10-CM

## 2015-03-03 DIAGNOSIS — F17200 Nicotine dependence, unspecified, uncomplicated: Secondary | ICD-10-CM

## 2015-03-03 DIAGNOSIS — F32A Depression, unspecified: Secondary | ICD-10-CM

## 2015-03-03 DIAGNOSIS — I10 Essential (primary) hypertension: Secondary | ICD-10-CM

## 2015-03-03 DIAGNOSIS — Z780 Asymptomatic menopausal state: Secondary | ICD-10-CM

## 2015-03-03 DIAGNOSIS — Z720 Tobacco use: Secondary | ICD-10-CM

## 2015-03-03 DIAGNOSIS — Z13820 Encounter for screening for osteoporosis: Secondary | ICD-10-CM

## 2015-03-03 DIAGNOSIS — C3491 Malignant neoplasm of unspecified part of right bronchus or lung: Secondary | ICD-10-CM

## 2015-03-03 MED ORDER — POTASSIUM CHLORIDE ER 10 MEQ PO TBCR: 10.0000 meq | EXTENDED_RELEASE_TABLET | Freq: Two times a day (BID) | ORAL | Status: DC

## 2015-03-03 MED ORDER — ALPRAZOLAM 1 MG PO TABS: 1.0000 mg | ORAL_TABLET | Freq: Three times a day (TID) | ORAL | Status: DC | PRN

## 2015-03-03 MED ORDER — LEVOTHYROXINE SODIUM 112 MCG PO TABS: 112.0000 ug | ORAL_TABLET | Freq: Every day | ORAL | Status: DC

## 2015-03-03 MED ORDER — DILTIAZEM HCL ER COATED BEADS 180 MG PO CP24: ORAL_CAPSULE | ORAL | Status: DC

## 2015-03-03 MED ORDER — LISINOPRIL 40 MG PO TABS: 40.0000 mg | ORAL_TABLET | Freq: Every day | ORAL | Status: DC

## 2015-03-03 NOTE — Progress Notes (Signed)
Subjective:    Patient ID: [name], female    DOB: [birth year], 74 y.o.   MRN: [id number]  Patient here today for follow up of chronic medical problems- she is c/o fatigue.     Hypertension This is a chronic problem. The current episode started more than 1 year ago. The problem is unchanged. The problem is controlled. Pertinent negatives include no chest pain, headaches, neck pain, palpitations or shortness of breath. Risk factors for coronary artery disease include dyslipidemia, post-menopausal state and sedentary lifestyle. Past treatments include ACE inhibitors. The current treatment provides moderate improvement. Compliance problems include diet and exercise.  Hypertensive end-organ damage includes a thyroid problem.  Thyroid Problem Patient reports no cold intolerance, constipation, diaphoresis, diarrhea, heat intolerance, palpitations, tremors or visual change.  Smoker occassional use of flovent when she starts wheezing. GAD/depression Only thing she is taking is xanax $Remove'1mg'HOLbjvu$ - takes at least 1 x a day hypokalemia No c/o lower ext cramping     *Just completed right breast cancer treatment- has metastasis in right lung and right lower leg.  Review of Systems  Constitutional: Negative for diaphoresis.  Respiratory: Negative for shortness of breath.   Cardiovascular: Negative for chest pain and palpitations.  Gastrointestinal: Negative for diarrhea and constipation.  Endocrine: Negative for cold intolerance and heat intolerance.  Musculoskeletal: Negative for neck pain.  Neurological: Negative for tremors and headaches.       Objective:   Physical Exam  Constitutional: She is oriented to person, place, and time. She appears well-developed and well-nourished.  HENT:  Nose: Nose normal.  Mouth/Throat: Oropharynx is clear and moist.  Eyes: EOM are normal.  Neck: Trachea normal, normal range of motion and full passive range of motion without pain. Neck supple. No JVD  present. Carotid bruit is not present. No thyromegaly present.  Cardiovascular: Normal rate, regular rhythm, normal heart sounds and intact distal pulses.  Exam reveals no gallop and no friction rub.   No murmur heard. Pulmonary/Chest: Effort normal and breath sounds normal. Right breast exhibits no inverted nipple, no nipple discharge, no skin change and no tenderness. Left breast exhibits no inverted nipple, no mass, no nipple discharge, no skin change and no tenderness.  Right breast mastectomy with lymph node removal.   Abdominal: Soft. Bowel sounds are normal. She exhibits no distension and no mass. There is no tenderness.  Musculoskeletal: Normal range of motion.  Lymphadenopathy:    She has no cervical adenopathy.  Neurological: She is alert and oriented to person, place, and time. She has normal reflexes.  Skin: Skin is warm and dry.  Psychiatric: She has a normal mood and affect. Her behavior is normal. Judgment and thought content normal.   BP 131/73 mmHg  Pulse 82  Temp(Src) 98.7 F (37.1 C) (Oral)  Ht $R'5\' 7"'in$  (1.702 m)  Wt 143 lb (64.864 kg)  BMI 22.39 kg/m2         Assessment & Plan:  1. Essential hypertension Do ot add salt to diet - CMP14+EGFR - NMR, lipoprofile - lisinopril (PRINIVIL,ZESTRIL) 40 MG tablet; Take 1 tablet (40 mg total) by mouth daily.  Dispense: 90 tablet; Refill: 1 - diltiazem (CARTIA XT) 180 MG 24 hr capsule; TAKE 1 CAPSULE BY MOUTH ONCE A DAY.  Dispense: 90 capsule; Refill: 1  2. Generalized anxiety disorder Stress management - ALPRAZolam (XANAX) 1 MG tablet; Take 1 tablet (1 mg total) by mouth 3 (three) times daily as needed for sleep.  Dispense: 90 tablet; Refill: 2  3.  Hypothyroidism, unspecified hypothyroidism type - Thyroid Panel With TSH - levothyroxine (SYNTHROID, LEVOTHROID) 112 MCG tablet; Take 1 tablet (112 mcg total) by mouth daily before breakfast.  Dispense: 90 tablet; Refill: 1  4. Depression Stress management  5. Anxiety  state  6. TOBACCO ABUSE Smoking cessation  7. Malignant neoplasm of right lung, unspecified part of lung Keep follow up appointments with oncologist  8. Osteoporosis screening Weight bearing exercises - DG Bone Density; Future  9. Hypokalemia - potassium chloride (K-DUR) 10 MEQ tablet; Take 1 tablet (10 mEq total) by mouth 2 (two) times daily.  Dispense: 180 tablet; Refill: 1    Labs pending Health maintenance reviewed Diet and exercise encouraged Continue all meds Follow up  In 3 month   [place], FNP

## 2015-03-03 NOTE — Patient Instructions (Signed)
Bone Health Our bones do many things. They provide structure, protect organs, anchor muscles, and store calcium. Adequate calcium in your diet and weight-bearing physical activity help build strong bones, improve bone amounts, and may reduce the risk of weakening of bones (osteoporosis) later in life. PEAK BONE MASS By age 73, the average woman has acquired most of her skeletal bone mass. A large decline occurs in older adults which increases the risk of osteoporosis. In women this occurs around the time of menopause. It is important for young girls to reach their peak bone mass in order to maintain bone health throughout life. A person with high bone mass as a young adult will be more likely to have a higher bone mass later in life. Not enough calcium consumption and physical activity early on could result in a failure to achieve optimum bone mass in adulthood. OSTEOPOROSIS Osteoporosis is a disease of the bones. It is defined as low bone mass with deterioration of bone structure. Osteoporosis leads to an increase risk of fractures with falls. These fractures commonly happen in the wrist, hip, and spine. While men and women of all ages and background can develop osteoporosis, some of the risk factors for osteoporosis are:  Female.  White.  Postmenopausal.  Older adults.  Small in body size.  Eating a diet low in calcium.  Physically inactive.  Smoking.  Use of some medications.  Family history. CALCIUM Calcium is a mineral needed by the body for healthy bones, teeth, and proper function of the heart, muscles, and nerves. The body cannot produce calcium so it must be absorbed through food. Good sources of calcium include:  Dairy products (low fat or nonfat milk, cheese, and yogurt).  Dark green leafy vegetables (bok choy and broccoli).  Calcium fortified foods (orange juice, cereal, bread, soy beverages, and tofu products).  Nuts (almonds). Recommended amounts of calcium vary  for individuals. RECOMMENDED CALCIUM INTAKES Age and Amount in mg per day  Children 1 to 3 years / 700 mg  Children 4 to 8 years / 1,000 mg  Children 9 to 13 years / 1,300 mg  Teens 14 to 18 years / 1,300 mg  Adults 19 to 50 years / 1,000 mg  Adult women 51 to 70 years / 1,200 mg  Adults 71 years and older / 1,200 mg  Pregnant and breastfeeding teens / 1,300 mg  Pregnant and breastfeeding adults / 1,000 mg Vitamin D also plays an important role in healthy bone development. Vitamin D helps in the absorption of calcium. WEIGHT-BEARING PHYSICAL ACTIVITY Regular physical activity has many positive health benefits. Benefits include strong bones. Weight-bearing physical activity early in life is important in reaching peak bone mass. Weight-bearing physical activities cause muscles and bones to work against gravity. Some examples of weight bearing physical activities include:  Walking, jogging, or running.  Field Hockey.  Jumping rope.  Dancing.  Soccer.  Tennis or Racquetball.  Stair climbing.  Basketball.  Hiking.  Weight lifting.  Aerobic fitness classes. Including weight-bearing physical activity into an exercise plan is a great way to keep bones healthy. Adults: Engage in at least 30 minutes of moderate physical activity on most, preferably all, days of the week. Children: Engage in at least 60 minutes of moderate physical activity on most, preferably all, days of the week. FOR MORE INFORMATION [place] Department of Agriculture, Center for Nutrition Policy and Promotion: www.cnpp.usda.gov National Osteoporosis Foundation: www.nof.org Document Released: 02/02/2004 Document Revised: 03/09/2013 Document Reviewed: 05/04/2009 ExitCare Patient Information   2015 [employer]. This information is not intended to replace advice given to you by your health care provider. Make sure you discuss any questions you have with your health care provider.  

## 2015-03-04 ENCOUNTER — Telehealth: Payer: Self-pay | Admitting: Nurse Practitioner

## 2015-03-04 DIAGNOSIS — C3490 Malignant neoplasm of unspecified part of unspecified bronchus or lung: Secondary | ICD-10-CM | POA: Diagnosis not present

## 2015-03-04 DIAGNOSIS — C7951 Secondary malignant neoplasm of bone: Secondary | ICD-10-CM | POA: Diagnosis not present

## 2015-03-04 DIAGNOSIS — D6481 Anemia due to antineoplastic chemotherapy: Secondary | ICD-10-CM | POA: Diagnosis not present

## 2015-03-04 LAB — THYROID PANEL WITH TSH
Free Thyroxine Index: 3.8 (ref 1.2–4.9)
T3 UPTAKE RATIO: 27 % (ref 24–39)
T4 TOTAL: 14.2 ug/dL — AB (ref 4.5–12.0)
TSH: 0.481 u[IU]/mL (ref 0.450–4.500)

## 2015-03-04 LAB — NMR, LIPOPROFILE
Cholesterol: 135 mg/dL (ref 100–199)
HDL Cholesterol by NMR: 88 mg/dL (ref >39)
HDL Particle Number: 32.9 umol/L (ref >=30.5)
LDL Particle Number: 424 nmol/L (ref <1000)
LDL Size: 21.3 nm (ref >20.5)
LDL-C: 40 mg/dL (ref 0–99)
Triglycerides by NMR: 33 mg/dL (ref 0–149)

## 2015-03-04 LAB — CMP14+EGFR
ALK PHOS: 61 IU/L (ref 39–117)
ALT: 7 IU/L (ref 0–32)
AST: 16 IU/L (ref 0–40)
Albumin/Globulin Ratio: 2 (ref 1.1–2.5)
Albumin: 4.3 g/dL (ref 3.5–4.8)
BILIRUBIN TOTAL: 0.5 mg/dL (ref 0.0–1.2)
BUN/Creatinine Ratio: 13 (ref 11–26)
BUN: 9 mg/dL (ref 8–27)
CHLORIDE: 104 mmol/L (ref 97–108)
CO2: 24 mmol/L (ref 18–29)
Calcium: 8.8 mg/dL (ref 8.7–10.3)
Creatinine, Ser: 0.71 mg/dL (ref 0.57–1.00)
GFR, EST AFRICAN AMERICAN: 98 mL/min/{1.73_m2} (ref >59)
GFR, EST NON AFRICAN AMERICAN: 85 mL/min/{1.73_m2} (ref >59)
GLOBULIN, TOTAL: 2.1 g/dL (ref 1.5–4.5)
Glucose: 85 mg/dL (ref 65–99)
Potassium: 4.1 mmol/L (ref 3.5–5.2)
SODIUM: 142 mmol/L (ref 134–144)
Total Protein: 6.4 g/dL (ref 6.0–8.5)

## 2015-03-09 DIAGNOSIS — C3491 Malignant neoplasm of unspecified part of right bronchus or lung: Secondary | ICD-10-CM | POA: Diagnosis not present

## 2015-03-09 DIAGNOSIS — N3000 Acute cystitis without hematuria: Secondary | ICD-10-CM | POA: Diagnosis not present

## 2015-03-09 DIAGNOSIS — C50211 Malignant neoplasm of upper-inner quadrant of right female breast: Secondary | ICD-10-CM | POA: Diagnosis not present

## 2015-03-09 DIAGNOSIS — C50911 Malignant neoplasm of unspecified site of right female breast: Secondary | ICD-10-CM | POA: Diagnosis not present

## 2015-03-09 DIAGNOSIS — D696 Thrombocytopenia, unspecified: Secondary | ICD-10-CM | POA: Diagnosis not present

## 2015-03-09 DIAGNOSIS — C800 Disseminated malignant neoplasm, unspecified: Secondary | ICD-10-CM | POA: Diagnosis not present

## 2015-03-23 ENCOUNTER — Ambulatory Visit (INDEPENDENT_AMBULATORY_CARE_PROVIDER_SITE_OTHER): Payer: Medicare Other | Admitting: Pharmacist

## 2015-03-23 ENCOUNTER — Encounter: Payer: Self-pay | Admitting: Pharmacist

## 2015-03-23 VITALS — BP 130/60 | HR 64 | Ht 67.0 in | Wt 143.5 lb

## 2015-03-23 DIAGNOSIS — Z0000 Encounter for general adult medical examination without abnormal findings: Secondary | ICD-10-CM

## 2015-03-23 DIAGNOSIS — M810 Age-related osteoporosis without current pathological fracture: Secondary | ICD-10-CM

## 2015-03-23 DIAGNOSIS — Z720 Tobacco use: Secondary | ICD-10-CM

## 2015-03-23 MED ORDER — ALENDRONATE SODIUM 70 MG PO TABS: 70.0000 mg | ORAL_TABLET | ORAL | Status: DC

## 2015-03-23 MED ORDER — BUPROPION HCL ER (XL) 150 MG PO TB24: 150.0000 mg | ORAL_TABLET | Freq: Every day | ORAL | Status: DC
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
## 2015-03-23 NOTE — Patient Instructions (Addendum)
[name] , Thank you for taking time to come for your Medicare Wellness Visit. I appreciate your ongoing commitment to your health goals. Please review the following plan we discussed and let me know if I can assist you in the future.   This is a list of the screening recommended for you and due dates:  Health Maintenance  Topic Date Due  . Shingles Vaccine  09/02/2015*  . Flu Shot  06/27/2015  . Mammogram  05/26/2016  . DEXA scan (bone density measurement)  03/02/2017  . Tetanus Vaccine  10/03/2020  . Colon Cancer Screening  06/30/2024  . Pneumonia vaccines  Completed  *Topic was postponed. The date shown is not the original due date.    Fall Prevention and Home Safety Falls cause injuries and can affect all age groups. It is possible to use preventive measures to significantly decrease the likelihood of falls. There are many simple measures which can make your home safer and prevent falls. OUTDOORS  Repair cracks and edges of walkways and driveways.  Remove high doorway thresholds.  Trim shrubbery on the main path into your home.  Have good outside lighting.  Clear walkways of tools, rocks, debris, and clutter.  Check that handrails are not broken and are securely fastened. Both sides of steps should have handrails.  Have leaves, snow, and ice cleared regularly.  Use sand or salt on walkways during winter months.  In the garage, clean up grease or oil spills. BATHROOM  Install night lights.  Install grab bars by the toilet and in the tub and shower.  Use non-skid mats or decals in the tub or shower.  Place a plastic non-slip stool in the shower to sit on, if needed.  Keep floors dry and clean up all water on the floor immediately.  Remove soap buildup in the tub or shower on a regular basis.  Secure bath mats with non-slip, double-sided rug tape.  Remove throw rugs and tripping hazards from the floors. BEDROOMS  Install night lights.  Make sure a bedside  light is easy to reach.  Do not use oversized bedding.  Keep a telephone by your bedside.  Have a firm chair with side arms to use for getting dressed.  Remove throw rugs and tripping hazards from the floor. KITCHEN  Keep handles on pots and pans turned toward the center of the stove. Use back burners when possible.  Clean up spills quickly and allow time for drying.  Avoid walking on wet floors.  Avoid hot utensils and knives.  Position shelves so they are not too high or low.  Place commonly used objects within easy reach.  If necessary, use a sturdy step stool with a grab bar when reaching.  Keep electrical cables out of the way.  Do not use floor polish or wax that makes floors slippery. If you must use wax, use non-skid floor wax.  Remove throw rugs and tripping hazards from the floor. STAIRWAYS  Never leave objects on stairs.  Place handrails on both sides of stairways and use them. Fix any loose handrails. Make sure handrails on both sides of the stairways are as long as the stairs.  Check carpeting to make sure it is firmly attached along stairs. Make repairs to worn or loose carpet promptly.  Avoid placing throw rugs at the top or bottom of stairways, or properly secure the rug with carpet tape to prevent slippage. Get rid of throw rugs, if possible.  Have an electrician put in  a light switch at the top and bottom of the stairs. OTHER FALL PREVENTION TIPS  Wear low-heel or rubber-soled shoes that are supportive and fit well. Wear closed toe shoes.  When using a stepladder, make sure it is fully opened and both spreaders are firmly locked. Do not climb a closed stepladder.  Add color or contrast paint or tape to grab bars and handrails in your home. Place contrasting color strips on first and last steps.  Learn and use mobility aids as needed. Install an electrical emergency response system.  Turn on lights to avoid dark areas. Replace light bulbs that burn  out immediately. Get light switches that glow.  Arrange furniture to create clear pathways. Keep furniture in the same place.  Firmly attach carpet with non-skid or double-sided tape.  Eliminate uneven floor surfaces.  Select a carpet pattern that does not visually hide the edge of steps.  Be aware of all pets. OTHER HOME SAFETY TIPS  Set the water temperature for 120 F (48.8 C).  Keep emergency numbers on or near the telephone.  Keep smoke detectors on every level of the home and near sleeping areas. Document Released: 11/02/2002 Document Revised: 05/13/2012 Document Reviewed: 02/01/2012 [facility] Patient Information 2015 [place], [place]. This information is not intended to replace advice given to you by your health care provider. Make sure you discuss any questions you have with your health care provider.               Exercise for Strong Bones  Exercise is important to build and maintain strong bones / bone density.  There are 2 types of exercises that are important to building and maintaining strong bones:  Weight- bearing and muscle-stregthening.  Weight-bearing Exercises  These exercises include activities that make you move against gravity while staying upright. Weight-bearing exercises can be high-impact or low-impact.  High-impact weight-bearing exercises help build bones and keep them strong. If you have broken a bone due to osteoporosis or are at risk of breaking a bone, you may need to avoid high-impact exercises. If you're not sure, you should check with your healthcare provider.  Examples of high-impact weight-bearing exercises are: Dancing  Doing high-impact aerobics  Hiking  Jogging/running  Jumping Rope  Stair climbing  Tennis  Low-impact weight-bearing exercises can also help keep bones strong and are a safe alternative if you cannot do high-impact exercises.   Examples of low-impact weight-bearing exercises are: Using elliptical training machines  Doing  low-impact aerobics  Using stair-step machines  Fast walking on a treadmill or outside   Muscle-Strengthening Exercises These exercises include activities where you move your body, a weight or some other resistance against gravity. They are also known as resistance exercises and include: Lifting weights  Using elastic exercise bands  Using weight machines  Lifting your own body weight  Functional movements, such as standing and rising up on your toes  Yoga and Pilates can also improve strength, balance and flexibility. However, certain positions may not be safe for people with osteoporosis or those at increased risk of broken bones. For example, exercises that have you bend forward may increase the chance of breaking a bone in the spine.   Non-Impact Exercises There are other types of exercises that can help prevent falls.  Non-impact exercises can help you to improve balance, posture and how well you move in everyday activities. Some of these exercises include: Balance exercises that strengthen your legs and test your balance, such as Tai Chi, can decrease  your risk of falls.  Posture exercises that improve your posture and reduce rounded or "sloping" shoulders can help you decrease the chance of breaking a bone, especially in the spine.  Functional exercises that improve how well you move can help you with everyday activities and decrease your chance of falling and breaking a bone. For example, if you have trouble getting up from a chair or climbing stairs, you should do these activities as exercises.   **A physical therapist can teach you balance, posture and functional exercises. He/she can also help you learn which exercises are safe and appropriate for you.  [facility] has a physical therapy office in [place] in front of our office and referrals can be made for assessments and treatment as needed and strength and balance training.  If you would like to have an assessment with [name] and  our physical therapy team please let a nurse or provider know.
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
## 2015-03-29 ENCOUNTER — Telehealth: Payer: Self-pay | Admitting: Nurse Practitioner

## 2015-03-29 NOTE — Telephone Encounter (Signed)
Will have to check with pharmacy and see what is on medication list.

## 2015-03-29 NOTE — Telephone Encounter (Signed)
Patient wants to know if there is something else she can take in place of the diltiazem. She states that it is too expensive it has moved to a 3rd tier copay now. Please advise

## 2015-03-29 NOTE — Telephone Encounter (Signed)
Patient aware and will call the pharmacy.

## 2015-03-31 DIAGNOSIS — R410 Disorientation, unspecified: Secondary | ICD-10-CM | POA: Diagnosis not present

## 2015-03-31 DIAGNOSIS — I6782 Cerebral ischemia: Secondary | ICD-10-CM | POA: Diagnosis not present

## 2015-03-31 DIAGNOSIS — C7951 Secondary malignant neoplasm of bone: Secondary | ICD-10-CM | POA: Diagnosis not present

## 2015-03-31 DIAGNOSIS — F329 Major depressive disorder, single episode, unspecified: Secondary | ICD-10-CM | POA: Diagnosis not present

## 2015-03-31 DIAGNOSIS — F419 Anxiety disorder, unspecified: Secondary | ICD-10-CM | POA: Diagnosis not present

## 2015-03-31 DIAGNOSIS — G319 Degenerative disease of nervous system, unspecified: Secondary | ICD-10-CM | POA: Diagnosis not present

## 2015-03-31 DIAGNOSIS — Z9011 Acquired absence of right breast and nipple: Secondary | ICD-10-CM | POA: Diagnosis not present

## 2015-03-31 DIAGNOSIS — I4581 Long QT syndrome: Secondary | ICD-10-CM | POA: Diagnosis not present

## 2015-03-31 DIAGNOSIS — E039 Hypothyroidism, unspecified: Secondary | ICD-10-CM | POA: Diagnosis not present

## 2015-03-31 DIAGNOSIS — R51 Headache: Secondary | ICD-10-CM | POA: Diagnosis not present

## 2015-03-31 DIAGNOSIS — T50901A Poisoning by unspecified drugs, medicaments and biological substances, accidental (unintentional), initial encounter: Secondary | ICD-10-CM | POA: Diagnosis not present

## 2015-03-31 DIAGNOSIS — H538 Other visual disturbances: Secondary | ICD-10-CM | POA: Diagnosis not present

## 2015-03-31 DIAGNOSIS — D6481 Anemia due to antineoplastic chemotherapy: Secondary | ICD-10-CM | POA: Diagnosis not present

## 2015-03-31 DIAGNOSIS — F17200 Nicotine dependence, unspecified, uncomplicated: Secondary | ICD-10-CM | POA: Diagnosis not present

## 2015-03-31 DIAGNOSIS — R443 Hallucinations, unspecified: Secondary | ICD-10-CM | POA: Diagnosis not present

## 2015-03-31 DIAGNOSIS — R42 Dizziness and giddiness: Secondary | ICD-10-CM | POA: Diagnosis not present

## 2015-03-31 DIAGNOSIS — Z7951 Long term (current) use of inhaled steroids: Secondary | ICD-10-CM | POA: Diagnosis not present

## 2015-03-31 DIAGNOSIS — Z853 Personal history of malignant neoplasm of breast: Secondary | ICD-10-CM | POA: Diagnosis not present

## 2015-03-31 DIAGNOSIS — J449 Chronic obstructive pulmonary disease, unspecified: Secondary | ICD-10-CM | POA: Diagnosis not present

## 2015-03-31 DIAGNOSIS — I10 Essential (primary) hypertension: Secondary | ICD-10-CM | POA: Diagnosis not present

## 2015-03-31 DIAGNOSIS — Z79899 Other long term (current) drug therapy: Secondary | ICD-10-CM | POA: Diagnosis not present

## 2015-03-31 DIAGNOSIS — T43291A Poisoning by other antidepressants, accidental (unintentional), initial encounter: Secondary | ICD-10-CM | POA: Diagnosis not present

## 2015-03-31 DIAGNOSIS — M810 Age-related osteoporosis without current pathological fracture: Secondary | ICD-10-CM | POA: Diagnosis not present

## 2015-03-31 DIAGNOSIS — Z85118 Personal history of other malignant neoplasm of bronchus and lung: Secondary | ICD-10-CM | POA: Diagnosis not present

## 2015-04-01 DIAGNOSIS — T50901A Poisoning by unspecified drugs, medicaments and biological substances, accidental (unintentional), initial encounter: Secondary | ICD-10-CM | POA: Diagnosis not present

## 2015-04-01 DIAGNOSIS — H538 Other visual disturbances: Secondary | ICD-10-CM | POA: Diagnosis not present

## 2015-04-01 DIAGNOSIS — J449 Chronic obstructive pulmonary disease, unspecified: Secondary | ICD-10-CM | POA: Diagnosis not present

## 2015-04-01 DIAGNOSIS — R42 Dizziness and giddiness: Secondary | ICD-10-CM | POA: Diagnosis not present

## 2015-04-01 DIAGNOSIS — G319 Degenerative disease of nervous system, unspecified: Secondary | ICD-10-CM | POA: Diagnosis not present

## 2015-04-01 DIAGNOSIS — I6782 Cerebral ischemia: Secondary | ICD-10-CM | POA: Diagnosis not present

## 2015-04-04 ENCOUNTER — Telehealth: Payer: Self-pay | Admitting: Pharmacist

## 2015-04-07 NOTE — Telephone Encounter (Signed)
Appointment given for tomorrow with Ronnald Collum, FNP @ 1:30.

## 2015-04-08 ENCOUNTER — Ambulatory Visit (INDEPENDENT_AMBULATORY_CARE_PROVIDER_SITE_OTHER): Payer: Medicare Other | Admitting: Nurse Practitioner

## 2015-04-08 ENCOUNTER — Encounter: Payer: Self-pay | Admitting: Nurse Practitioner

## 2015-04-08 VITALS — BP 148/78 | HR 66 | Temp 97.8°F | Ht 67.0 in | Wt 145.0 lb

## 2015-04-08 DIAGNOSIS — Z09 Encounter for follow-up examination after completed treatment for conditions other than malignant neoplasm: Secondary | ICD-10-CM | POA: Diagnosis not present

## 2015-04-08 DIAGNOSIS — J301 Allergic rhinitis due to pollen: Secondary | ICD-10-CM | POA: Diagnosis not present

## 2015-04-08 NOTE — Patient Instructions (Signed)

## 2015-04-08 NOTE — Progress Notes (Signed)
   Subjective:    Patient ID: [name], female    DOB: [birth year], 74 y.o.   MRN: [id number]  HPI Patient was in ER for taking to much Wellbutrin. Patient claims that she had not taken in extra wellbutrin. The er told her not to take anymore. Patient says taht she thinks that it did not mix with her cancer meds. SHe feels fine now. No cognitive problems. The Er suggested she start on aricept or namenda for memory.  * she is c/o sinus congestion today without headache or cough.  Review of Systems  Constitutional: Negative.   HENT: Negative.   Respiratory: Negative.   Cardiovascular: Negative.   Genitourinary: Negative.   Neurological: Negative.   Psychiatric/Behavioral: Negative.  Negative for suicidal ideas and self-injury. The patient is not nervous/anxious.   All other systems reviewed and are negative.      Objective:   Physical Exam  Constitutional: She is oriented to person, place, and time. She appears well-developed and well-nourished.  HENT:  Right Ear: Hearing, tympanic membrane, external ear and ear canal normal.  Left Ear: Hearing, tympanic membrane, external ear and ear canal normal.  Nose: Mucosal edema and rhinorrhea present. Right sinus exhibits no maxillary sinus tenderness and no frontal sinus tenderness. Left sinus exhibits no maxillary sinus tenderness and no frontal sinus tenderness.  Mouth/Throat: Uvula is midline, oropharynx is clear and moist and mucous membranes are normal.  Cardiovascular: Normal rate and normal heart sounds.   Neurological: She is alert and oriented to person, place, and time.  Skin: Skin is warm.  Psychiatric: She has a normal mood and affect. Her behavior is normal. Judgment and thought content normal.   BP 148/78 mmHg  Pulse 66  Temp(Src) 97.8 F (36.6 C) (Oral)  Ht '5\' 7"'$  (1.702 m)  Wt 145 lb (65.772 kg)  BMI 22.71 kg/m2   MME- score 26     Assessment & Plan:  1. Hospital discharge follow-up Hospital records  reviewed MME normal- does  No need aricept or namenda  2. Allergic rhinitis due to pollen Add claritin or zyrtec to current meds  Mary-Margaret Hassell Done, FNP

## 2015-04-09 ENCOUNTER — Other Ambulatory Visit: Payer: Self-pay | Admitting: Nurse Practitioner

## 2015-04-21 DIAGNOSIS — R935 Abnormal findings on diagnostic imaging of other abdominal regions, including retroperitoneum: Secondary | ICD-10-CM | POA: Diagnosis not present

## 2015-04-21 DIAGNOSIS — C7981 Secondary malignant neoplasm of breast: Secondary | ICD-10-CM | POA: Diagnosis not present

## 2015-04-21 DIAGNOSIS — C50911 Malignant neoplasm of unspecified site of right female breast: Secondary | ICD-10-CM | POA: Diagnosis not present

## 2015-04-21 DIAGNOSIS — C7800 Secondary malignant neoplasm of unspecified lung: Secondary | ICD-10-CM | POA: Diagnosis not present

## 2015-04-21 DIAGNOSIS — C3491 Malignant neoplasm of unspecified part of right bronchus or lung: Secondary | ICD-10-CM | POA: Diagnosis not present

## 2015-04-21 DIAGNOSIS — I7090 Unspecified atherosclerosis: Secondary | ICD-10-CM | POA: Diagnosis not present

## 2015-04-21 DIAGNOSIS — C800 Disseminated malignant neoplasm, unspecified: Secondary | ICD-10-CM | POA: Diagnosis not present

## 2015-04-21 DIAGNOSIS — C7951 Secondary malignant neoplasm of bone: Secondary | ICD-10-CM | POA: Diagnosis not present

## 2015-04-26 DIAGNOSIS — K5900 Constipation, unspecified: Secondary | ICD-10-CM | POA: Diagnosis not present

## 2015-04-26 DIAGNOSIS — C50011 Malignant neoplasm of nipple and areola, right female breast: Secondary | ICD-10-CM | POA: Diagnosis not present

## 2015-04-26 DIAGNOSIS — E876 Hypokalemia: Secondary | ICD-10-CM | POA: Diagnosis not present

## 2015-04-26 DIAGNOSIS — C3491 Malignant neoplasm of unspecified part of right bronchus or lung: Secondary | ICD-10-CM | POA: Diagnosis not present

## 2015-04-26 DIAGNOSIS — Z95828 Presence of other vascular implants and grafts: Secondary | ICD-10-CM | POA: Diagnosis not present

## 2015-04-28 DIAGNOSIS — C7951 Secondary malignant neoplasm of bone: Secondary | ICD-10-CM | POA: Diagnosis not present

## 2015-04-28 DIAGNOSIS — C800 Disseminated malignant neoplasm, unspecified: Secondary | ICD-10-CM | POA: Diagnosis not present

## 2015-05-09 ENCOUNTER — Other Ambulatory Visit: Payer: Self-pay | Admitting: Nurse Practitioner

## 2015-05-26 DIAGNOSIS — Z95828 Presence of other vascular implants and grafts: Secondary | ICD-10-CM | POA: Diagnosis not present

## 2015-05-26 DIAGNOSIS — C7951 Secondary malignant neoplasm of bone: Secondary | ICD-10-CM | POA: Diagnosis not present

## 2015-05-26 DIAGNOSIS — D6481 Anemia due to antineoplastic chemotherapy: Secondary | ICD-10-CM | POA: Diagnosis not present

## 2015-05-26 DIAGNOSIS — C3491 Malignant neoplasm of unspecified part of right bronchus or lung: Secondary | ICD-10-CM | POA: Diagnosis not present

## 2015-06-03 ENCOUNTER — Encounter: Payer: Self-pay | Admitting: Nurse Practitioner

## 2015-06-03 ENCOUNTER — Ambulatory Visit (INDEPENDENT_AMBULATORY_CARE_PROVIDER_SITE_OTHER): Payer: Medicare Other | Admitting: Nurse Practitioner

## 2015-06-03 VITALS — BP 104/59 | HR 73 | Temp 98.2°F | Ht 67.0 in | Wt 142.0 lb

## 2015-06-03 DIAGNOSIS — E039 Hypothyroidism, unspecified: Secondary | ICD-10-CM | POA: Diagnosis not present

## 2015-06-03 DIAGNOSIS — C3491 Malignant neoplasm of unspecified part of right bronchus or lung: Secondary | ICD-10-CM | POA: Diagnosis not present

## 2015-06-03 DIAGNOSIS — T451X5A Adverse effect of antineoplastic and immunosuppressive drugs, initial encounter: Secondary | ICD-10-CM

## 2015-06-03 DIAGNOSIS — E876 Hypokalemia: Secondary | ICD-10-CM

## 2015-06-03 DIAGNOSIS — D6481 Anemia due to antineoplastic chemotherapy: Secondary | ICD-10-CM | POA: Diagnosis not present

## 2015-06-03 DIAGNOSIS — J320 Chronic maxillary sinusitis: Secondary | ICD-10-CM | POA: Diagnosis not present

## 2015-06-03 DIAGNOSIS — F411 Generalized anxiety disorder: Secondary | ICD-10-CM

## 2015-06-03 DIAGNOSIS — F32A Depression, unspecified: Secondary | ICD-10-CM

## 2015-06-03 DIAGNOSIS — F329 Major depressive disorder, single episode, unspecified: Secondary | ICD-10-CM | POA: Diagnosis not present

## 2015-06-03 DIAGNOSIS — I10 Essential (primary) hypertension: Secondary | ICD-10-CM

## 2015-06-03 DIAGNOSIS — C7951 Secondary malignant neoplasm of bone: Secondary | ICD-10-CM | POA: Diagnosis not present

## 2015-06-03 NOTE — Patient Instructions (Signed)
Smoking Cessation, Tips for Success  If you are ready to quit smoking, congratulations! You have chosen to help yourself be healthier. Cigarettes bring nicotine, tar, carbon monoxide, and other irritants into your body. Your lungs, heart, and blood vessels will be able to work better without these poisons. There are many different ways to quit smoking. Nicotine gum, nicotine patches, a nicotine inhaler, or nicotine nasal spray can help with physical craving. Hypnosis, support groups, and medicines help break the habit of smoking.  WHAT THINGS CAN I DO TO MAKE QUITTING EASIER?   Here are some tips to help you quit for good:  · Pick a date when you will quit smoking completely. Tell all of your friends and family about your plan to quit on that date.  · Do not try to slowly cut down on the number of cigarettes you are smoking. Pick a quit date and quit smoking completely starting on that day.  · Throw away all cigarettes.    · Clean and remove all ashtrays from your home, work, and car.  · On a card, write down your reasons for quitting. Carry the card with you and read it when you get the urge to smoke.  · Cleanse your body of nicotine. Drink enough water and fluids to keep your urine clear or pale yellow. Do this after quitting to flush the nicotine from your body.  · Learn to predict your moods. Do not let a bad situation be your excuse to have a cigarette. Some situations in your life might tempt you into wanting a cigarette.  · Never have "just one" cigarette. It leads to wanting another and another. Remind yourself of your decision to quit.  · Change habits associated with smoking. If you smoked while driving or when feeling stressed, try other activities to replace smoking. Stand up when drinking your coffee. Brush your teeth after eating. Sit in a different chair when you read the paper. Avoid alcohol while trying to quit, and try to drink fewer caffeinated beverages. Alcohol and caffeine may urge you to  smoke.  · Avoid foods and drinks that can trigger a desire to smoke, such as sugary or spicy foods and alcohol.  · Ask people who smoke not to smoke around you.  · Have something planned to do right after eating or having a cup of coffee. For example, plan to take a walk or exercise.  · Try a relaxation exercise to calm you down and decrease your stress. Remember, you may be tense and nervous for the first 2 weeks after you quit, but this will pass.  · Find new activities to keep your hands busy. Play with a pen, coin, or rubber band. Doodle or draw things on paper.  · Brush your teeth right after eating. This will help cut down on the craving for the taste of tobacco after meals. You can also try mouthwash.    · Use oral substitutes in place of cigarettes. Try using lemon drops, carrots, cinnamon sticks, or chewing gum. Keep them handy so they are available when you have the urge to smoke.  · When you have the urge to smoke, try deep breathing.  · Designate your home as a nonsmoking area.  · If you are a heavy smoker, ask your health care provider about a prescription for nicotine chewing gum. It can ease your withdrawal from nicotine.  · Reward yourself. Set aside the cigarette money you save and buy yourself something nice.  · Look for   support from others. Join a support group or smoking cessation program. Ask someone at home or at work to help you with your plan to quit smoking.  · Always ask yourself, "Do I need this cigarette or is this just a reflex?" Tell yourself, "Today, I choose not to smoke," or "I do not want to smoke." You are reminding yourself of your decision to quit.  · Do not replace cigarette smoking with electronic cigarettes (commonly called e-cigarettes). The safety of e-cigarettes is unknown, and some may contain harmful chemicals.  · If you relapse, do not give up! Plan ahead and think about what you will do the next time you get the urge to smoke.  HOW WILL I FEEL WHEN I QUIT SMOKING?  You  may have symptoms of withdrawal because your body is used to nicotine (the addictive substance in cigarettes). You may crave cigarettes, be irritable, feel very hungry, cough often, get headaches, or have difficulty concentrating. The withdrawal symptoms are only temporary. They are strongest when you first quit but will go away within 10-14 days. When withdrawal symptoms occur, stay in control. Think about your reasons for quitting. Remind yourself that these are signs that your body is healing and getting used to being without cigarettes. Remember that withdrawal symptoms are easier to treat than the major diseases that smoking can cause.   Even after the withdrawal is over, expect periodic urges to smoke. However, these cravings are generally short lived and will go away whether you smoke or not. Do not smoke!  WHAT RESOURCES ARE AVAILABLE TO HELP ME QUIT SMOKING?  Your health care provider can direct you to community resources or hospitals for support, which may include:  · Group support.  · Education.  · Hypnosis.  · Therapy.  Document Released: 08/10/2004 Document Revised: 03/29/2014 Document Reviewed: 04/30/2013  ExitCare® Patient Information ©2015 [employer]. This information is not intended to replace advice given to you by your health care provider. Make sure you discuss any questions you have with your health care provider.

## 2015-06-03 NOTE — Progress Notes (Signed)
Subjective:    Patient ID: [name], female    DOB: December 21, 1940, 74 y.o.   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  Sinus Problem This is a chronic problem. The current episode started more than 1 month ago. Associated symptoms include headaches. Pertinent negatives include no diaphoresis, neck pain or shortness of breath. Past treatments include oral decongestants. The treatment provided no relief.  Hypertension This is a chronic problem. The current episode started more than 1 year ago. The problem is unchanged. The problem is controlled. Associated symptoms include headaches. Pertinent negatives include no chest pain, neck pain, palpitations or shortness of breath. Risk factors for coronary artery disease include dyslipidemia, post-menopausal state and sedentary lifestyle. Past treatments include ACE inhibitors. The current treatment provides moderate improvement. Compliance problems include diet and exercise.  Hypertensive end-organ damage includes a thyroid problem.  Thyroid Problem Patient reports no cold intolerance, constipation, diaphoresis, diarrhea, heat intolerance, palpitations, tremors or visual change.  Smoker occassional use of flovent when she starts wheezing. GAD/depression Only thing she is taking is xanax 10m- takes at least 1 x a day hypokalemia No c/o lower ext cramping   *Just completed right breast cancer treatment- has metastasis in right lung and right lower leg.  Review of Systems  Constitutional: Negative for diaphoresis.  Respiratory: Negative for shortness of breath.   Cardiovascular: Negative for chest pain and palpitations.  Gastrointestinal: Negative for diarrhea and constipation.  Endocrine: Negative for cold intolerance and heat intolerance.  Musculoskeletal:  Negative for neck pain.  Neurological: Positive for headaches. Negative for tremors.       Objective:   Physical Exam  Constitutional: She is oriented to person, place, and time. She appears well-developed and well-nourished.  HENT:  Nose: Nose normal.  Mouth/Throat: Oropharynx is clear and moist.  Eyes: EOM are normal.  Neck: Trachea normal, normal range of motion and full passive range of motion without pain. Neck supple. No JVD present. Carotid bruit is not present. No thyromegaly present.  Cardiovascular: Normal rate, regular rhythm, normal heart sounds and intact distal pulses.  Exam reveals no gallop and no friction rub.   No murmur heard. Pulmonary/Chest: Effort normal and breath sounds normal. Right breast exhibits no inverted nipple, no nipple discharge, no skin change and no tenderness. Left breast exhibits no inverted nipple, no mass, no nipple discharge, no skin change and no tenderness.  Right breast mastectomy with lymph node removal.   Abdominal: Soft. Bowel sounds are normal. She exhibits no distension and no mass. There is no tenderness.  Musculoskeletal: Normal range of motion.  Lymphadenopathy:    She has no cervical adenopathy.  Neurological: She is alert and oriented to person, place, and time. She has normal reflexes.  Skin: Skin is warm and dry.  Psychiatric: She has a normal mood and affect. Her behavior is normal. Judgment and thought content normal.   BP 104/59 mmHg  Pulse 73  Temp(Src) 98.2 F (36.8 C) (Oral)  Ht _0  (1.702 m)  Wt 142 lb (64.411 kg)  BMI 22.24 kg/m2      Assessment & Plan:  1. Essential hypertension Do not add salt to diet - CMP14+EGFR - Lipid panel  2. Malignant neoplasm of right lung, unspecified part of lung Keep follow up with oncology  3.  Hypothyroidism, unspecified hypothyroidism type  4. Bone metastases Keep follow up with oncologist  5. Hypokalemia  6. Depression Stress management  7. Anxiety state 8. Anemia  associated with chemotherapy Keep check up with oncologist  9. Chronic maxillary sinusitis Saline flushes and flonase as rx - Ambulatory referral to ENT   Smoking cessation encourgaed Labs pending Health maintenance reviewed Diet and exercise encouraged Continue all meds Follow up  In 3 month   [place], FNP

## 2015-06-04 LAB — CMP14+EGFR
A/G RATIO: 2.2 (ref 1.1–2.5)
ALT: 11 IU/L (ref 0–32)
AST: 17 IU/L (ref 0–40)
Albumin: 4.4 g/dL (ref 3.5–4.8)
Alkaline Phosphatase: 51 IU/L (ref 39–117)
BUN/Creatinine Ratio: 10 — ABNORMAL LOW (ref 11–26)
BUN: 8 mg/dL (ref 8–27)
Bilirubin Total: 0.3 mg/dL (ref 0.0–1.2)
CALCIUM: 9.1 mg/dL (ref 8.7–10.3)
CHLORIDE: 101 mmol/L (ref 97–108)
CO2: 25 mmol/L (ref 18–29)
Creatinine, Ser: 0.8 mg/dL (ref 0.57–1.00)
GFR calc non Af Amer: 73 mL/min/{1.73_m2} (ref >59)
GFR, EST AFRICAN AMERICAN: 85 mL/min/{1.73_m2} (ref >59)
GLUCOSE: 90 mg/dL (ref 65–99)
Globulin, Total: 2 g/dL (ref 1.5–4.5)
Potassium: 4.3 mmol/L (ref 3.5–5.2)
Sodium: 141 mmol/L (ref 134–144)
TOTAL PROTEIN: 6.4 g/dL (ref 6.0–8.5)

## 2015-06-04 LAB — LIPID PANEL
CHOLESTEROL TOTAL: 134 mg/dL (ref 100–199)
Chol/HDL Ratio: 2 ratio units (ref 0.0–4.4)
HDL: 66 mg/dL (ref >39)
LDL Calculated: 55 mg/dL (ref 0–99)
TRIGLYCERIDES: 67 mg/dL (ref 0–149)
VLDL Cholesterol Cal: 13 mg/dL (ref 5–40)

## 2015-06-06 ENCOUNTER — Other Ambulatory Visit: Payer: Self-pay | Admitting: Nurse Practitioner

## 2015-06-07 DIAGNOSIS — R0602 Shortness of breath: Secondary | ICD-10-CM | POA: Diagnosis not present

## 2015-06-07 DIAGNOSIS — C50011 Malignant neoplasm of nipple and areola, right female breast: Secondary | ICD-10-CM | POA: Diagnosis not present

## 2015-06-07 DIAGNOSIS — E876 Hypokalemia: Secondary | ICD-10-CM | POA: Diagnosis not present

## 2015-06-07 DIAGNOSIS — C800 Disseminated malignant neoplasm, unspecified: Secondary | ICD-10-CM | POA: Diagnosis not present

## 2015-06-07 DIAGNOSIS — Z95828 Presence of other vascular implants and grafts: Secondary | ICD-10-CM | POA: Diagnosis not present

## 2015-06-07 DIAGNOSIS — C3491 Malignant neoplasm of unspecified part of right bronchus or lung: Secondary | ICD-10-CM | POA: Diagnosis not present

## 2015-06-23 DIAGNOSIS — C7951 Secondary malignant neoplasm of bone: Secondary | ICD-10-CM | POA: Diagnosis not present

## 2015-06-23 DIAGNOSIS — Z95828 Presence of other vascular implants and grafts: Secondary | ICD-10-CM | POA: Diagnosis not present

## 2015-06-23 DIAGNOSIS — C50919 Malignant neoplasm of unspecified site of unspecified female breast: Secondary | ICD-10-CM | POA: Diagnosis not present

## 2015-06-23 DIAGNOSIS — D6481 Anemia due to antineoplastic chemotherapy: Secondary | ICD-10-CM | POA: Diagnosis not present

## 2015-06-24 ENCOUNTER — Encounter: Payer: Self-pay | Admitting: Nurse Practitioner

## 2015-06-25 ENCOUNTER — Other Ambulatory Visit: Payer: Self-pay | Admitting: Nurse Practitioner

## 2015-07-08 ENCOUNTER — Other Ambulatory Visit: Payer: Self-pay

## 2015-07-08 ENCOUNTER — Telehealth: Payer: Self-pay | Admitting: Nurse Practitioner

## 2015-07-08 DIAGNOSIS — F411 Generalized anxiety disorder: Secondary | ICD-10-CM

## 2015-07-08 MED ORDER — ALPRAZOLAM 1 MG PO TABS: 1.0000 mg | ORAL_TABLET | Freq: Three times a day (TID) | ORAL | Status: DC | PRN

## 2015-07-08 NOTE — Telephone Encounter (Signed)
rx called into pharmacy

## 2015-07-08 NOTE — Telephone Encounter (Signed)
Please call in xanax with 1 refills 

## 2015-07-08 NOTE — Telephone Encounter (Signed)
Last seen 06/03/15  MMM If approved route to nurse to call into [facility]  [phone]

## 2015-07-08 NOTE — Telephone Encounter (Signed)
Patient notified

## 2015-07-19 DIAGNOSIS — C50011 Malignant neoplasm of nipple and areola, right female breast: Secondary | ICD-10-CM | POA: Diagnosis not present

## 2015-07-19 DIAGNOSIS — R590 Localized enlarged lymph nodes: Secondary | ICD-10-CM | POA: Diagnosis not present

## 2015-07-19 DIAGNOSIS — Z9011 Acquired absence of right breast and nipple: Secondary | ICD-10-CM | POA: Diagnosis not present

## 2015-07-19 DIAGNOSIS — C800 Disseminated malignant neoplasm, unspecified: Secondary | ICD-10-CM | POA: Diagnosis not present

## 2015-07-19 DIAGNOSIS — R911 Solitary pulmonary nodule: Secondary | ICD-10-CM | POA: Diagnosis not present

## 2015-07-19 DIAGNOSIS — Z9049 Acquired absence of other specified parts of digestive tract: Secondary | ICD-10-CM | POA: Diagnosis not present

## 2015-07-19 DIAGNOSIS — E279 Disorder of adrenal gland, unspecified: Secondary | ICD-10-CM | POA: Diagnosis not present

## 2015-07-19 DIAGNOSIS — C3491 Malignant neoplasm of unspecified part of right bronchus or lung: Secondary | ICD-10-CM | POA: Diagnosis not present

## 2015-07-19 DIAGNOSIS — C3490 Malignant neoplasm of unspecified part of unspecified bronchus or lung: Secondary | ICD-10-CM | POA: Diagnosis not present

## 2015-07-19 DIAGNOSIS — C7951 Secondary malignant neoplasm of bone: Secondary | ICD-10-CM | POA: Diagnosis not present

## 2015-07-20 DIAGNOSIS — D6481 Anemia due to antineoplastic chemotherapy: Secondary | ICD-10-CM | POA: Diagnosis not present

## 2015-07-20 DIAGNOSIS — C50011 Malignant neoplasm of nipple and areola, right female breast: Secondary | ICD-10-CM | POA: Diagnosis not present

## 2015-07-20 DIAGNOSIS — Z95828 Presence of other vascular implants and grafts: Secondary | ICD-10-CM | POA: Diagnosis not present

## 2015-07-20 DIAGNOSIS — C3491 Malignant neoplasm of unspecified part of right bronchus or lung: Secondary | ICD-10-CM | POA: Diagnosis not present

## 2015-07-20 DIAGNOSIS — C7951 Secondary malignant neoplasm of bone: Secondary | ICD-10-CM | POA: Diagnosis not present

## 2015-07-28 DIAGNOSIS — F17200 Nicotine dependence, unspecified, uncomplicated: Secondary | ICD-10-CM | POA: Diagnosis not present

## 2015-07-28 DIAGNOSIS — I10 Essential (primary) hypertension: Secondary | ICD-10-CM | POA: Diagnosis not present

## 2015-07-28 DIAGNOSIS — Z9011 Acquired absence of right breast and nipple: Secondary | ICD-10-CM | POA: Diagnosis not present

## 2015-07-28 DIAGNOSIS — C3411 Malignant neoplasm of upper lobe, right bronchus or lung: Secondary | ICD-10-CM | POA: Diagnosis not present

## 2015-07-28 DIAGNOSIS — C50911 Malignant neoplasm of unspecified site of right female breast: Secondary | ICD-10-CM | POA: Diagnosis not present

## 2015-07-28 DIAGNOSIS — F4310 Post-traumatic stress disorder, unspecified: Secondary | ICD-10-CM | POA: Diagnosis not present

## 2015-07-28 DIAGNOSIS — Z803 Family history of malignant neoplasm of breast: Secondary | ICD-10-CM | POA: Diagnosis not present

## 2015-07-28 DIAGNOSIS — Z8042 Family history of malignant neoplasm of prostate: Secondary | ICD-10-CM | POA: Diagnosis not present

## 2015-07-28 DIAGNOSIS — E039 Hypothyroidism, unspecified: Secondary | ICD-10-CM | POA: Diagnosis not present

## 2015-07-28 DIAGNOSIS — Z79899 Other long term (current) drug therapy: Secondary | ICD-10-CM | POA: Diagnosis not present

## 2015-08-04 ENCOUNTER — Other Ambulatory Visit: Payer: Self-pay | Admitting: Nurse Practitioner

## 2015-08-12 ENCOUNTER — Other Ambulatory Visit: Payer: Self-pay | Admitting: Nurse Practitioner

## 2015-08-15 ENCOUNTER — Other Ambulatory Visit: Payer: Self-pay

## 2015-08-15 MED ORDER — ALBUTEROL SULFATE HFA 108 (90 BASE) MCG/ACT IN AERS: INHALATION_SPRAY | RESPIRATORY_TRACT | Status: AC

## 2015-08-16 DIAGNOSIS — I10 Essential (primary) hypertension: Secondary | ICD-10-CM | POA: Diagnosis not present

## 2015-08-16 DIAGNOSIS — Z803 Family history of malignant neoplasm of breast: Secondary | ICD-10-CM | POA: Diagnosis not present

## 2015-08-16 DIAGNOSIS — C50911 Malignant neoplasm of unspecified site of right female breast: Secondary | ICD-10-CM | POA: Diagnosis not present

## 2015-08-16 DIAGNOSIS — Z8042 Family history of malignant neoplasm of prostate: Secondary | ICD-10-CM | POA: Diagnosis not present

## 2015-08-16 DIAGNOSIS — Z9011 Acquired absence of right breast and nipple: Secondary | ICD-10-CM | POA: Diagnosis not present

## 2015-08-16 DIAGNOSIS — F4310 Post-traumatic stress disorder, unspecified: Secondary | ICD-10-CM | POA: Diagnosis not present

## 2015-08-16 DIAGNOSIS — C3411 Malignant neoplasm of upper lobe, right bronchus or lung: Secondary | ICD-10-CM | POA: Diagnosis not present

## 2015-08-16 DIAGNOSIS — F17200 Nicotine dependence, unspecified, uncomplicated: Secondary | ICD-10-CM | POA: Diagnosis not present

## 2015-08-16 DIAGNOSIS — E039 Hypothyroidism, unspecified: Secondary | ICD-10-CM | POA: Diagnosis not present

## 2015-08-16 DIAGNOSIS — Z79899 Other long term (current) drug therapy: Secondary | ICD-10-CM | POA: Diagnosis not present

## 2015-08-18 DIAGNOSIS — C3490 Malignant neoplasm of unspecified part of unspecified bronchus or lung: Secondary | ICD-10-CM | POA: Diagnosis not present

## 2015-08-18 DIAGNOSIS — C50919 Malignant neoplasm of unspecified site of unspecified female breast: Secondary | ICD-10-CM | POA: Diagnosis not present

## 2015-08-25 DIAGNOSIS — F17200 Nicotine dependence, unspecified, uncomplicated: Secondary | ICD-10-CM | POA: Diagnosis not present

## 2015-08-25 DIAGNOSIS — Z79899 Other long term (current) drug therapy: Secondary | ICD-10-CM | POA: Diagnosis not present

## 2015-08-25 DIAGNOSIS — Z9011 Acquired absence of right breast and nipple: Secondary | ICD-10-CM | POA: Diagnosis not present

## 2015-08-25 DIAGNOSIS — Z8042 Family history of malignant neoplasm of prostate: Secondary | ICD-10-CM | POA: Diagnosis not present

## 2015-08-25 DIAGNOSIS — C3411 Malignant neoplasm of upper lobe, right bronchus or lung: Secondary | ICD-10-CM | POA: Diagnosis not present

## 2015-08-25 DIAGNOSIS — C7931 Secondary malignant neoplasm of brain: Secondary | ICD-10-CM | POA: Diagnosis not present

## 2015-08-25 DIAGNOSIS — F4310 Post-traumatic stress disorder, unspecified: Secondary | ICD-10-CM | POA: Diagnosis not present

## 2015-08-25 DIAGNOSIS — C3490 Malignant neoplasm of unspecified part of unspecified bronchus or lung: Secondary | ICD-10-CM | POA: Diagnosis not present

## 2015-08-25 DIAGNOSIS — Z803 Family history of malignant neoplasm of breast: Secondary | ICD-10-CM | POA: Diagnosis not present

## 2015-08-25 DIAGNOSIS — I10 Essential (primary) hypertension: Secondary | ICD-10-CM | POA: Diagnosis not present

## 2015-08-25 DIAGNOSIS — C50911 Malignant neoplasm of unspecified site of right female breast: Secondary | ICD-10-CM | POA: Diagnosis not present

## 2015-08-25 DIAGNOSIS — C3410 Malignant neoplasm of upper lobe, unspecified bronchus or lung: Secondary | ICD-10-CM | POA: Diagnosis not present

## 2015-08-25 DIAGNOSIS — E039 Hypothyroidism, unspecified: Secondary | ICD-10-CM | POA: Diagnosis not present

## 2015-08-29 DIAGNOSIS — Z510 Encounter for antineoplastic radiation therapy: Secondary | ICD-10-CM | POA: Diagnosis not present

## 2015-08-29 DIAGNOSIS — C50911 Malignant neoplasm of unspecified site of right female breast: Secondary | ICD-10-CM | POA: Diagnosis not present

## 2015-08-29 DIAGNOSIS — D6481 Anemia due to antineoplastic chemotherapy: Secondary | ICD-10-CM | POA: Diagnosis not present

## 2015-08-29 DIAGNOSIS — Z95828 Presence of other vascular implants and grafts: Secondary | ICD-10-CM | POA: Diagnosis not present

## 2015-08-29 DIAGNOSIS — C3491 Malignant neoplasm of unspecified part of right bronchus or lung: Secondary | ICD-10-CM | POA: Diagnosis not present

## 2015-08-29 DIAGNOSIS — C7931 Secondary malignant neoplasm of brain: Secondary | ICD-10-CM | POA: Diagnosis not present

## 2015-08-29 DIAGNOSIS — C7951 Secondary malignant neoplasm of bone: Secondary | ICD-10-CM | POA: Diagnosis not present

## 2015-08-29 DIAGNOSIS — C3410 Malignant neoplasm of upper lobe, unspecified bronchus or lung: Secondary | ICD-10-CM | POA: Diagnosis not present

## 2015-08-29 DIAGNOSIS — C50011 Malignant neoplasm of nipple and areola, right female breast: Secondary | ICD-10-CM | POA: Diagnosis not present

## 2015-08-29 DIAGNOSIS — C800 Disseminated malignant neoplasm, unspecified: Secondary | ICD-10-CM | POA: Diagnosis not present

## 2015-08-31 DIAGNOSIS — C3410 Malignant neoplasm of upper lobe, unspecified bronchus or lung: Secondary | ICD-10-CM | POA: Diagnosis not present

## 2015-08-31 DIAGNOSIS — C7931 Secondary malignant neoplasm of brain: Secondary | ICD-10-CM | POA: Diagnosis not present

## 2015-08-31 DIAGNOSIS — Z510 Encounter for antineoplastic radiation therapy: Secondary | ICD-10-CM | POA: Diagnosis not present

## 2015-09-01 DIAGNOSIS — Z510 Encounter for antineoplastic radiation therapy: Secondary | ICD-10-CM | POA: Diagnosis not present

## 2015-09-01 DIAGNOSIS — C3410 Malignant neoplasm of upper lobe, unspecified bronchus or lung: Secondary | ICD-10-CM | POA: Diagnosis not present

## 2015-09-02 DIAGNOSIS — C3410 Malignant neoplasm of upper lobe, unspecified bronchus or lung: Secondary | ICD-10-CM | POA: Diagnosis not present

## 2015-09-02 DIAGNOSIS — Z510 Encounter for antineoplastic radiation therapy: Secondary | ICD-10-CM | POA: Diagnosis not present

## 2015-09-05 DIAGNOSIS — C3410 Malignant neoplasm of upper lobe, unspecified bronchus or lung: Secondary | ICD-10-CM | POA: Diagnosis not present

## 2015-09-05 DIAGNOSIS — Z510 Encounter for antineoplastic radiation therapy: Secondary | ICD-10-CM | POA: Diagnosis not present

## 2015-09-06 ENCOUNTER — Ambulatory Visit (INDEPENDENT_AMBULATORY_CARE_PROVIDER_SITE_OTHER): Payer: Medicare Other | Admitting: Nurse Practitioner

## 2015-09-06 ENCOUNTER — Encounter: Payer: Self-pay | Admitting: Nurse Practitioner

## 2015-09-06 VITALS — BP 133/63 | HR 62 | Temp 97.8°F | Ht 67.0 in | Wt 143.0 lb

## 2015-09-06 DIAGNOSIS — F411 Generalized anxiety disorder: Secondary | ICD-10-CM

## 2015-09-06 DIAGNOSIS — E039 Hypothyroidism, unspecified: Secondary | ICD-10-CM | POA: Diagnosis not present

## 2015-09-06 DIAGNOSIS — C3491 Malignant neoplasm of unspecified part of right bronchus or lung: Secondary | ICD-10-CM

## 2015-09-06 DIAGNOSIS — I10 Essential (primary) hypertension: Secondary | ICD-10-CM

## 2015-09-06 DIAGNOSIS — F329 Major depressive disorder, single episode, unspecified: Secondary | ICD-10-CM | POA: Diagnosis not present

## 2015-09-06 DIAGNOSIS — Z510 Encounter for antineoplastic radiation therapy: Secondary | ICD-10-CM | POA: Diagnosis not present

## 2015-09-06 DIAGNOSIS — C7951 Secondary malignant neoplasm of bone: Secondary | ICD-10-CM | POA: Diagnosis not present

## 2015-09-06 DIAGNOSIS — C3410 Malignant neoplasm of upper lobe, unspecified bronchus or lung: Secondary | ICD-10-CM | POA: Diagnosis not present

## 2015-09-06 DIAGNOSIS — F32A Depression, unspecified: Secondary | ICD-10-CM

## 2015-09-06 MED ORDER — LISINOPRIL 40 MG PO TABS: 40.0000 mg | ORAL_TABLET | Freq: Every day | ORAL | Status: DC

## 2015-09-06 MED ORDER — ALPRAZOLAM 1 MG PO TABS: 1.0000 mg | ORAL_TABLET | Freq: Three times a day (TID) | ORAL | Status: DC | PRN

## 2015-09-06 MED ORDER — ALPRAZOLAM 1 MG PO TABS
1.0000 mg | ORAL_TABLET | Freq: Three times a day (TID) | ORAL | Status: DC | PRN
Start: 2015-09-06 — End: 2015-09-06

## 2015-09-06 MED ORDER — DILTIAZEM HCL ER COATED BEADS 180 MG PO CP24: ORAL_CAPSULE | ORAL | Status: DC

## 2015-09-06 MED ORDER — LEVOTHYROXINE SODIUM 112 MCG PO TABS: 112.0000 ug | ORAL_TABLET | Freq: Every day | ORAL | Status: DC

## 2015-09-06 NOTE — Progress Notes (Signed)
Subjective:    Patient ID: [name], female    DOB: 1941-03-20, 74 y.o.   MRN: [id number]  Patient here today for follow up of chronic medical problems   Hypertension This is a chronic problem. The current episode started more than 1 year ago. The problem is unchanged. The problem is controlled. Pertinent negatives include no chest pain or palpitations. Risk factors for coronary artery disease include dyslipidemia, post-menopausal state and sedentary lifestyle. Past treatments include ACE inhibitors. The current treatment provides moderate improvement. Compliance problems include diet and exercise.  Hypertensive end-organ damage includes a thyroid problem.  Thyroid Problem Patient reports no cold intolerance, constipation, diarrhea, heat intolerance, palpitations, tremors or visual change.  Smoker occassional use of flovent when she starts wheezing. GAD/depression Only thing she is taking is xanax $Remove'1mg'kqQbnfS$ - takes at least 1 x a day hypokalemia No c/o lower ext cramping   *Just completed right breast cancer treatment- has metastasis in right lung and bone of right lower leg. Is currently getting radiation  Review of Systems  Cardiovascular: Negative for chest pain and palpitations.  Gastrointestinal: Negative for diarrhea and constipation.  Endocrine: Negative for cold intolerance and heat intolerance.  Neurological: Negative for tremors.       Objective:   Physical Exam  Constitutional: She is oriented to person, place, and time. She appears well-developed and well-nourished.  HENT:  Nose: Nose normal.  Mouth/Throat: Oropharynx is clear and moist.  Eyes: EOM are normal.  Neck: Trachea normal, normal range of motion and full passive range of motion without pain. Neck supple. No JVD present. Carotid bruit is not present. No thyromegaly present.  Cardiovascular: Normal rate, regular rhythm, normal heart sounds and intact distal pulses.  Exam reveals no gallop and no friction rub.    No murmur heard. Pulmonary/Chest: Effort normal and breath sounds normal. Right breast exhibits no inverted nipple, no nipple discharge, no skin change and no tenderness. Left breast exhibits no inverted nipple, no mass, no nipple discharge, no skin change and no tenderness.  Right breast mastectomy with lymph node removal.   Abdominal: Soft. Bowel sounds are normal. She exhibits no distension and no mass. There is no tenderness.  Musculoskeletal: Normal range of motion.  Lymphadenopathy:    She has no cervical adenopathy.  Neurological: She is alert and oriented to person, place, and time. She has normal reflexes.  Skin: Skin is warm and dry.  Psychiatric: She has a normal mood and affect. Her behavior is normal. Judgment and thought content normal.   BP 133/63 mmHg  Pulse 62  Temp(Src) 97.8 F (36.6 C) (Oral)  Ht $R'5\' 7"'RC$  (1.702 m)  Wt 143 lb (64.864 kg)  BMI 22.39 kg/m2      Assessment & Plan:  1. Essential hypertension Do not add salt to diet - lisinopril (PRINIVIL,ZESTRIL) 40 MG tablet; Take 1 tablet (40 mg total) by mouth daily.  Dispense: 90 tablet; Refill: 1 - diltiazem (CARTIA XT) 180 MG 24 hr capsule; TAKE 1 CAPSULE BY MOUTH ONCE A DAY.  Dispense: 90 capsule; Refill: 1 - CMP14+EGFR - Lipid panel  2. Malignant neoplasm of right lung, unspecified part of lung ([place])  3. Hypothyroidism, unspecified hypothyroidism type - levothyroxine (SYNTHROID, LEVOTHROID) 112 MCG tablet; Take 1 tablet (112 mcg total) by mouth daily before breakfast.  Dispense: 90 tablet; Refill: 1 - Thyroid Panel With TSH  4. Bone metastases ([place])  5. Depression Stress management  6. Generalized anxiety disorder Stress management - ALPRAZolam (XANAX) 1 MG tablet;  Take 1 tablet (1 mg total) by mouth 3 (three) times daily as needed for sleep.  Dispense: 90 tablet; Refill: 2    Labs pending Health maintenance reviewed Diet and exercise encouraged Continue all meds Follow up  In 3 month    Pass Christian, FNP

## 2015-09-06 NOTE — Patient Instructions (Signed)
Health Maintenance, Female Adopting a healthy lifestyle and getting preventive care can go a long way to promote health and wellness. Talk with your health care provider about what schedule of regular examinations is right for you. This is a good chance for you to check in with your provider about disease prevention and staying healthy. In between checkups, there are plenty of things you can do on your own. Experts have done a lot of research about which lifestyle changes and preventive measures are most likely to keep you healthy. Ask your health care provider for more information. WEIGHT AND DIET  Eat a healthy diet  Be sure to include plenty of vegetables, fruits, low-fat dairy products, and lean protein.  Do not eat a lot of foods high in solid fats, added sugars, or salt.  Get regular exercise. This is one of the most important things you can do for your health.  Most adults should exercise for at least 150 minutes each week. The exercise should increase your heart rate and make you sweat (moderate-intensity exercise).  Most adults should also do strengthening exercises at least twice a week. This is in addition to the moderate-intensity exercise.  Maintain a healthy weight  Body mass index (BMI) is a measurement that can be used to identify possible weight problems. It estimates body fat based on height and weight. Your health care provider can help determine your BMI and help you achieve or maintain a healthy weight.  For females 20 years of age and older:   A BMI below 18.5 is considered underweight.  A BMI of 18.5 to 24.9 is normal.  A BMI of 25 to 29.9 is considered overweight.  A BMI of 30 and above is considered obese.  Watch levels of cholesterol and blood lipids  You should start having your blood tested for lipids and cholesterol at 73 years of age, then have this test every 5 years.  You may need to have your cholesterol levels checked more often if:  Your lipid  or cholesterol levels are high.  You are older than 73 years of age.  You are at high risk for heart disease.  CANCER SCREENING   Lung Cancer  Lung cancer screening is recommended for adults 55-80 years old who are at high risk for lung cancer because of a history of smoking.  A yearly low-dose CT scan of the lungs is recommended for people who:  Currently smoke.  Have quit within the past 15 years.  Have at least a 30-pack-year history of smoking. A pack year is smoking an average of one pack of cigarettes a day for 1 year.  Yearly screening should continue until it has been 15 years since you quit.  Yearly screening should stop if you develop a health problem that would prevent you from having lung cancer treatment.  Breast Cancer  Practice breast self-awareness. This means understanding how your breasts normally appear and feel.  It also means doing regular breast self-exams. Let your health care provider know about any changes, no matter how small.  If you are in your 20s or 30s, you should have a clinical breast exam (CBE) by a health care provider every 1-3 years as part of a regular health exam.  If you are 40 or older, have a CBE every year. Also consider having a breast X-ray (mammogram) every year.  If you have a family history of breast cancer, talk to your health care provider about genetic screening.  If you   are at high risk for breast cancer, talk to your health care provider about having an MRI and a mammogram every year.  Breast cancer gene (BRCA) assessment is recommended for women who have family members with BRCA-related cancers. BRCA-related cancers include:  Breast.  Ovarian.  Tubal.  Peritoneal cancers.  Results of the assessment will determine the need for genetic counseling and BRCA1 and BRCA2 testing. Cervical Cancer Your health care provider may recommend that you be screened regularly for cancer of the pelvic organs (ovaries, uterus, and  vagina). This screening involves a pelvic examination, including checking for microscopic changes to the surface of your cervix (Pap test). You may be encouraged to have this screening done every 3 years, beginning at age 21.  For women ages 30-65, health care providers may recommend pelvic exams and Pap testing every 3 years, or they may recommend the Pap and pelvic exam, combined with testing for human papilloma virus (HPV), every 5 years. Some types of HPV increase your risk of cervical cancer. Testing for HPV may also be done on women of any age with unclear Pap test results.  Other health care providers may not recommend any screening for nonpregnant women who are considered low risk for pelvic cancer and who do not have symptoms. Ask your health care provider if a screening pelvic exam is right for you.  If you have had past treatment for cervical cancer or a condition that could lead to cancer, you need Pap tests and screening for cancer for at least 20 years after your treatment. If Pap tests have been discontinued, your risk factors (such as having a new sexual partner) need to be reassessed to determine if screening should resume. Some women have medical problems that increase the chance of getting cervical cancer. In these cases, your health care provider may recommend more frequent screening and Pap tests. Colorectal Cancer  This type of cancer can be detected and often prevented.  Routine colorectal cancer screening usually begins at 73 years of age and continues through 73 years of age.  Your health care provider may recommend screening at an earlier age if you have risk factors for colon cancer.  Your health care provider may also recommend using home test kits to check for hidden blood in the stool.  A small camera at the end of a tube can be used to examine your colon directly (sigmoidoscopy or colonoscopy). This is done to check for the earliest forms of colorectal  cancer.  Routine screening usually begins at age 50.  Direct examination of the colon should be repeated every 5-10 years through 73 years of age. However, you may need to be screened more often if early forms of precancerous polyps or small growths are found. Skin Cancer  Check your skin from head to toe regularly.  Tell your health care provider about any new moles or changes in moles, especially if there is a change in a mole's shape or color.  Also tell your health care provider if you have a mole that is larger than the size of a pencil eraser.  Always use sunscreen. Apply sunscreen liberally and repeatedly throughout the day.  Protect yourself by wearing long sleeves, pants, a wide-brimmed hat, and sunglasses whenever you are outside. HEART DISEASE, DIABETES, AND HIGH BLOOD PRESSURE   High blood pressure causes heart disease and increases the risk of stroke. High blood pressure is more likely to develop in:  People who have blood pressure in the high end   of the normal range (130-139/85-89 mm Hg).  People who are overweight or obese.  People who are African American.  If you are 38-23 years of age, have your blood pressure checked every 3-5 years. If you are 61 years of age or older, have your blood pressure checked every year. You should have your blood pressure measured twice--once when you are at a hospital or clinic, and once when you are not at a hospital or clinic. Record the average of the two measurements. To check your blood pressure when you are not at a hospital or clinic, you can use:  An automated blood pressure machine at a pharmacy.  A home blood pressure monitor.  If you are between 45 years and 39 years old, ask your health care provider if you should take aspirin to prevent strokes.  Have regular diabetes screenings. This involves taking a blood sample to check your fasting blood sugar level.  If you are at a normal weight and have a low risk for diabetes,  have this test once every three years after 73 years of age.  If you are overweight and have a high risk for diabetes, consider being tested at a younger age or more often. PREVENTING INFECTION  Hepatitis B  If you have a higher risk for hepatitis B, you should be screened for this virus. You are considered at high risk for hepatitis B if:  You were born in a country where hepatitis B is common. Ask your health care provider which countries are considered high risk.  Your parents were born in a high-risk country, and you have not been immunized against hepatitis B (hepatitis B vaccine).  You have HIV or AIDS.  You use needles to inject street drugs.  You live with someone who has hepatitis B.  You have had sex with someone who has hepatitis B.  You get hemodialysis treatment.  You take certain medicines for conditions, including cancer, organ transplantation, and autoimmune conditions. Hepatitis C  Blood testing is recommended for:  Everyone born from [birth year] through 1964.  Anyone with known risk factors for hepatitis C. Sexually transmitted infections (STIs)  You should be screened for sexually transmitted infections (STIs) including gonorrhea and chlamydia if:  You are sexually active and are younger than 73 years of age.  You are older than 73 years of age and your health care provider tells you that you are at risk for this type of infection.  Your sexual activity has changed since you were last screened and you are at an increased risk for chlamydia or gonorrhea. Ask your health care provider if you are at risk.  If you do not have HIV, but are at risk, it may be recommended that you take a prescription medicine daily to prevent HIV infection. This is called pre-exposure prophylaxis (PrEP). You are considered at risk if:  You are sexually active and do not regularly use condoms or know the HIV status of your partner(s).  You take drugs by injection.  You are sexually  active with a partner who has HIV. Talk with your health care provider about whether you are at high risk of being infected with HIV. If you choose to begin PrEP, you should first be tested for HIV. You should then be tested every 3 months for as long as you are taking PrEP.  PREGNANCY   If you are premenopausal and you may become pregnant, ask your health care provider about preconception counseling.  If you may  become pregnant, take 400 to 800 micrograms (mcg) of folic acid every day.  If you want to prevent pregnancy, talk to your health care provider about birth control (contraception). OSTEOPOROSIS AND MENOPAUSE   Osteoporosis is a disease in which the bones lose minerals and strength with aging. This can result in serious bone fractures. Your risk for osteoporosis can be identified using a bone density scan.  If you are 61 years of age or older, or if you are at risk for osteoporosis and fractures, ask your health care provider if you should be screened.  Ask your health care provider whether you should take a calcium or vitamin D supplement to lower your risk for osteoporosis.  Menopause may have certain physical symptoms and risks.  Hormone replacement therapy may reduce some of these symptoms and risks. Talk to your health care provider about whether hormone replacement therapy is right for you.  HOME CARE INSTRUCTIONS   Schedule regular health, dental, and eye exams.  Stay current with your immunizations.   Do not use any tobacco products including cigarettes, chewing tobacco, or electronic cigarettes.  If you are pregnant, do not drink alcohol.  If you are breastfeeding, limit how much and how often you drink alcohol.  Limit alcohol intake to no more than 1 drink per day for nonpregnant women. One drink equals 12 ounces of beer, 5 ounces of wine, or 1 ounces of hard liquor.  Do not use street drugs.  Do not share needles.  Ask your health care provider for help if  you need support or information about quitting drugs.  Tell your health care provider if you often feel depressed.  Tell your health care provider if you have ever been abused or do not feel safe at home.   This information is not intended to replace advice given to you by your health care provider. Make sure you discuss any questions you have with your health care provider.   Document Released: 05/28/2011 Document Revised: 12/03/2014 Document Reviewed: 10/14/2013 Elsevier Interactive Patient Education [employer].

## 2015-09-06 NOTE — Addendum Note (Signed)
Addended by: Chevis Pretty on: 09/06/2015 03:45 PM   Modules accepted: Orders

## 2015-09-07 DIAGNOSIS — C7931 Secondary malignant neoplasm of brain: Secondary | ICD-10-CM | POA: Diagnosis not present

## 2015-09-07 DIAGNOSIS — C3410 Malignant neoplasm of upper lobe, unspecified bronchus or lung: Secondary | ICD-10-CM | POA: Diagnosis not present

## 2015-09-07 DIAGNOSIS — Z510 Encounter for antineoplastic radiation therapy: Secondary | ICD-10-CM | POA: Diagnosis not present

## 2015-09-07 LAB — CMP14+EGFR
ALBUMIN: 4.4 g/dL (ref 3.5–4.8)
ALT: 7 IU/L (ref 0–32)
AST: 14 IU/L (ref 0–40)
Albumin/Globulin Ratio: 2.1 (ref 1.1–2.5)
Alkaline Phosphatase: 46 IU/L (ref 39–117)
BUN/Creatinine Ratio: 15 (ref 11–26)
BUN: 12 mg/dL (ref 8–27)
Bilirubin Total: <0.2 mg/dL (ref 0.0–1.2)
CO2: 25 mmol/L (ref 18–29)
CREATININE: 0.78 mg/dL (ref 0.57–1.00)
Calcium: 9.6 mg/dL (ref 8.7–10.3)
Chloride: 98 mmol/L (ref 97–108)
GFR, EST AFRICAN AMERICAN: 87 mL/min/{1.73_m2} (ref >59)
GFR, EST NON AFRICAN AMERICAN: 76 mL/min/{1.73_m2} (ref >59)
Globulin, Total: 2.1 g/dL (ref 1.5–4.5)
Glucose: 92 mg/dL (ref 65–99)
Potassium: 4.1 mmol/L (ref 3.5–5.2)
Sodium: 144 mmol/L (ref 134–144)
TOTAL PROTEIN: 6.5 g/dL (ref 6.0–8.5)

## 2015-09-07 LAB — LIPID PANEL
CHOL/HDL RATIO: 1.8 ratio (ref 0.0–4.4)
Cholesterol, Total: 142 mg/dL (ref 100–199)
HDL: 79 mg/dL (ref >39)
LDL Calculated: 53 mg/dL (ref 0–99)
TRIGLYCERIDES: 49 mg/dL (ref 0–149)
VLDL CHOLESTEROL CAL: 10 mg/dL (ref 5–40)

## 2015-09-07 LAB — THYROID PANEL WITH TSH
FREE THYROXINE INDEX: 3.4 (ref 1.2–4.9)
T3 Uptake Ratio: 26 % (ref 24–39)
T4, Total: 13.2 ug/dL — ABNORMAL HIGH (ref 4.5–12.0)
TSH: 0.545 u[IU]/mL (ref 0.450–4.500)

## 2015-09-08 DIAGNOSIS — Z510 Encounter for antineoplastic radiation therapy: Secondary | ICD-10-CM | POA: Diagnosis not present

## 2015-09-08 DIAGNOSIS — C3410 Malignant neoplasm of upper lobe, unspecified bronchus or lung: Secondary | ICD-10-CM | POA: Diagnosis not present

## 2015-09-09 DIAGNOSIS — C3410 Malignant neoplasm of upper lobe, unspecified bronchus or lung: Secondary | ICD-10-CM | POA: Diagnosis not present

## 2015-09-09 DIAGNOSIS — Z510 Encounter for antineoplastic radiation therapy: Secondary | ICD-10-CM | POA: Diagnosis not present

## 2015-09-12 DIAGNOSIS — Z510 Encounter for antineoplastic radiation therapy: Secondary | ICD-10-CM | POA: Diagnosis not present

## 2015-09-12 DIAGNOSIS — C3410 Malignant neoplasm of upper lobe, unspecified bronchus or lung: Secondary | ICD-10-CM | POA: Diagnosis not present

## 2015-09-13 DIAGNOSIS — Z510 Encounter for antineoplastic radiation therapy: Secondary | ICD-10-CM | POA: Diagnosis not present

## 2015-09-13 DIAGNOSIS — C3410 Malignant neoplasm of upper lobe, unspecified bronchus or lung: Secondary | ICD-10-CM | POA: Diagnosis not present

## 2015-09-26 DIAGNOSIS — C7951 Secondary malignant neoplasm of bone: Secondary | ICD-10-CM | POA: Diagnosis not present

## 2015-09-26 DIAGNOSIS — C3490 Malignant neoplasm of unspecified part of unspecified bronchus or lung: Secondary | ICD-10-CM | POA: Diagnosis not present

## 2015-09-29 DIAGNOSIS — C7951 Secondary malignant neoplasm of bone: Secondary | ICD-10-CM | POA: Diagnosis not present

## 2015-09-29 DIAGNOSIS — Z95828 Presence of other vascular implants and grafts: Secondary | ICD-10-CM | POA: Diagnosis not present

## 2015-09-29 DIAGNOSIS — T451X5A Adverse effect of antineoplastic and immunosuppressive drugs, initial encounter: Secondary | ICD-10-CM | POA: Diagnosis not present

## 2015-09-29 DIAGNOSIS — C800 Disseminated malignant neoplasm, unspecified: Secondary | ICD-10-CM | POA: Diagnosis not present

## 2015-09-29 DIAGNOSIS — D6481 Anemia due to antineoplastic chemotherapy: Secondary | ICD-10-CM | POA: Diagnosis not present

## 2015-10-06 DIAGNOSIS — C7951 Secondary malignant neoplasm of bone: Secondary | ICD-10-CM | POA: Diagnosis not present

## 2015-10-06 DIAGNOSIS — C50011 Malignant neoplasm of nipple and areola, right female breast: Secondary | ICD-10-CM | POA: Diagnosis not present

## 2015-10-06 DIAGNOSIS — C800 Disseminated malignant neoplasm, unspecified: Secondary | ICD-10-CM | POA: Diagnosis not present

## 2015-10-06 DIAGNOSIS — C3491 Malignant neoplasm of unspecified part of right bronchus or lung: Secondary | ICD-10-CM | POA: Diagnosis not present

## 2015-10-13 DIAGNOSIS — Z923 Personal history of irradiation: Secondary | ICD-10-CM | POA: Diagnosis not present

## 2015-10-13 DIAGNOSIS — C3490 Malignant neoplasm of unspecified part of unspecified bronchus or lung: Secondary | ICD-10-CM | POA: Diagnosis not present

## 2015-10-13 DIAGNOSIS — C7931 Secondary malignant neoplasm of brain: Secondary | ICD-10-CM | POA: Diagnosis not present

## 2015-10-13 DIAGNOSIS — Z853 Personal history of malignant neoplasm of breast: Secondary | ICD-10-CM | POA: Diagnosis not present

## 2015-10-17 DIAGNOSIS — C3490 Malignant neoplasm of unspecified part of unspecified bronchus or lung: Secondary | ICD-10-CM | POA: Diagnosis not present

## 2015-10-17 DIAGNOSIS — R918 Other nonspecific abnormal finding of lung field: Secondary | ICD-10-CM | POA: Diagnosis not present

## 2015-10-17 DIAGNOSIS — E279 Disorder of adrenal gland, unspecified: Secondary | ICD-10-CM | POA: Diagnosis not present

## 2015-10-17 DIAGNOSIS — C800 Disseminated malignant neoplasm, unspecified: Secondary | ICD-10-CM | POA: Diagnosis not present

## 2015-10-17 DIAGNOSIS — C7951 Secondary malignant neoplasm of bone: Secondary | ICD-10-CM | POA: Diagnosis not present

## 2015-10-17 DIAGNOSIS — C3491 Malignant neoplasm of unspecified part of right bronchus or lung: Secondary | ICD-10-CM | POA: Diagnosis not present

## 2015-10-17 DIAGNOSIS — C50011 Malignant neoplasm of nipple and areola, right female breast: Secondary | ICD-10-CM | POA: Diagnosis not present

## 2015-10-18 DIAGNOSIS — Z87891 Personal history of nicotine dependence: Secondary | ICD-10-CM | POA: Diagnosis not present

## 2015-10-18 DIAGNOSIS — R630 Anorexia: Secondary | ICD-10-CM | POA: Diagnosis not present

## 2015-10-18 DIAGNOSIS — C7951 Secondary malignant neoplasm of bone: Secondary | ICD-10-CM | POA: Diagnosis not present

## 2015-10-18 DIAGNOSIS — J439 Emphysema, unspecified: Secondary | ICD-10-CM | POA: Diagnosis not present

## 2015-10-18 DIAGNOSIS — C50911 Malignant neoplasm of unspecified site of right female breast: Secondary | ICD-10-CM | POA: Diagnosis not present

## 2015-10-27 DIAGNOSIS — C3491 Malignant neoplasm of unspecified part of right bronchus or lung: Secondary | ICD-10-CM | POA: Diagnosis not present

## 2015-10-27 DIAGNOSIS — C50011 Malignant neoplasm of nipple and areola, right female breast: Secondary | ICD-10-CM | POA: Diagnosis not present

## 2015-10-27 DIAGNOSIS — Z95828 Presence of other vascular implants and grafts: Secondary | ICD-10-CM | POA: Diagnosis not present

## 2015-10-27 DIAGNOSIS — T451X5A Adverse effect of antineoplastic and immunosuppressive drugs, initial encounter: Secondary | ICD-10-CM | POA: Diagnosis not present

## 2015-10-27 DIAGNOSIS — C7951 Secondary malignant neoplasm of bone: Secondary | ICD-10-CM | POA: Diagnosis not present

## 2015-10-27 DIAGNOSIS — D6481 Anemia due to antineoplastic chemotherapy: Secondary | ICD-10-CM | POA: Diagnosis not present

## 2015-10-31 ENCOUNTER — Other Ambulatory Visit: Payer: Self-pay | Admitting: Nurse Practitioner

## 2015-11-10 ENCOUNTER — Telehealth: Payer: Self-pay | Admitting: Nurse Practitioner

## 2015-11-14 ENCOUNTER — Telehealth: Payer: Self-pay | Admitting: Nurse Practitioner

## 2015-11-14 NOTE — Telephone Encounter (Signed)
Pt given appt tomorrow with MMM at 3:45.

## 2015-11-15 ENCOUNTER — Encounter: Payer: Self-pay | Admitting: Nurse Practitioner

## 2015-11-15 ENCOUNTER — Ambulatory Visit (INDEPENDENT_AMBULATORY_CARE_PROVIDER_SITE_OTHER): Payer: Medicare Other | Admitting: Nurse Practitioner

## 2015-11-15 VITALS — BP 154/61 | HR 71 | Temp 97.8°F | Ht 67.0 in | Wt 134.0 lb

## 2015-11-15 DIAGNOSIS — E039 Hypothyroidism, unspecified: Secondary | ICD-10-CM | POA: Diagnosis not present

## 2015-11-15 DIAGNOSIS — R634 Abnormal weight loss: Secondary | ICD-10-CM | POA: Diagnosis not present

## 2015-11-15 DIAGNOSIS — I959 Hypotension, unspecified: Secondary | ICD-10-CM

## 2015-11-15 NOTE — Progress Notes (Signed)
   Subjective:    Patient ID: [name], female    DOB: [birth year], 74 y.o.   MRN: [id number]  HPI  Patient brought in by her daughter- Patient was diagnosed with lung and breast cancer and had chemo treatments and was able to maintain her weight during all of this. She finished her treatments in October- Now she is struggling to maintain her weight. Feels tired all the time- worried about her thyroid not being right. Also her daughter says that her blood pressure has been running low and she has had some dizziness. She has not taken meds today.   Review of Systems  Constitutional: Negative.   HENT: Negative.   Respiratory: Negative.   Cardiovascular: Negative.   Genitourinary: Negative.   Neurological: Negative.   Psychiatric/Behavioral: Negative.   All other systems reviewed and are negative.      Objective:   Physical Exam  Constitutional: She is oriented to person, place, and time. She appears well-developed and well-nourished.  Cardiovascular: Normal rate, regular rhythm and normal heart sounds.   Pulmonary/Chest: Effort normal and breath sounds normal.  Neurological: She is alert and oriented to person, place, and time.  Skin: Skin is warm.  Psychiatric: She has a normal mood and affect. Her behavior is normal. Judgment and thought content normal.     BP 154/61 mmHg  Pulse 71  Temp(Src) 97.8 F (36.6 C) (Oral)  Ht '5\' 7"'$  (1.702 m)  Wt 134 lb (60.782 kg)  BMI 20.98 kg/m2       Assessment & Plan:  1. Loss of weight Add ensure 2 cans daily Eat 3 meals a day and 2 snacks a day - CMP14+EGFR  2. Hypotension, unspecified hypotension type Cut lisniporil in 1/2 and keep diary of blood pressure at home  3. Hypothyroidism, unspecified hypothyroidism type Labs pending - Thyroid Panel With TSH  Mary-Margaret Hassell Done, FNP

## 2015-11-16 ENCOUNTER — Telehealth: Payer: Self-pay | Admitting: Nurse Practitioner

## 2015-11-16 LAB — THYROID PANEL WITH TSH
Free Thyroxine Index: 3.4 (ref 1.2–4.9)
T3 Uptake Ratio: 30 % (ref 24–39)
T4, Total: 11.2 ug/dL (ref 4.5–12.0)
TSH: 1.08 u[IU]/mL (ref 0.450–4.500)

## 2015-11-16 LAB — CMP14+EGFR
A/G RATIO: 2.8 — AB (ref 1.1–2.5)
ALT: 5 IU/L (ref 0–32)
AST: 15 IU/L (ref 0–40)
Albumin: 4.7 g/dL (ref 3.5–4.8)
Alkaline Phosphatase: 44 IU/L (ref 39–117)
BUN/Creatinine Ratio: 14 (ref 11–26)
BUN: 12 mg/dL (ref 8–27)
Bilirubin Total: 0.3 mg/dL (ref 0.0–1.2)
CALCIUM: 9.7 mg/dL (ref 8.7–10.3)
CO2: 25 mmol/L (ref 18–29)
CREATININE: 0.83 mg/dL (ref 0.57–1.00)
Chloride: 94 mmol/L — ABNORMAL LOW (ref 96–106)
GFR, EST AFRICAN AMERICAN: 81 mL/min/{1.73_m2} (ref >59)
GFR, EST NON AFRICAN AMERICAN: 70 mL/min/{1.73_m2} (ref >59)
GLOBULIN, TOTAL: 1.7 g/dL (ref 1.5–4.5)
Glucose: 98 mg/dL (ref 65–99)
POTASSIUM: 4.3 mmol/L (ref 3.5–5.2)
SODIUM: 137 mmol/L (ref 134–144)
TOTAL PROTEIN: 6.4 g/dL (ref 6.0–8.5)

## 2015-11-16 NOTE — Telephone Encounter (Signed)
Spoke with pt, daughter had already left pt's home, dont take lisinopril tonight or tomorrow. Check BPs. Needs a lower dose of medicine. Daughter will be back tomorrow. Pt says she thinks her thyroid is involved, has normal thyroid levels yesterday.

## 2015-11-16 NOTE — Telephone Encounter (Signed)
They split her lisinopril and her bp was 82/52 p-70. She did give her the diltiazem today also. Please advise

## 2015-11-18 NOTE — Telephone Encounter (Signed)
Thyroid is normal so not issues not coming from thyroid.

## 2015-11-24 DIAGNOSIS — T451X5A Adverse effect of antineoplastic and immunosuppressive drugs, initial encounter: Secondary | ICD-10-CM | POA: Diagnosis not present

## 2015-11-24 DIAGNOSIS — C50011 Malignant neoplasm of nipple and areola, right female breast: Secondary | ICD-10-CM | POA: Diagnosis not present

## 2015-11-24 DIAGNOSIS — C7951 Secondary malignant neoplasm of bone: Secondary | ICD-10-CM | POA: Diagnosis not present

## 2015-11-24 DIAGNOSIS — D6481 Anemia due to antineoplastic chemotherapy: Secondary | ICD-10-CM | POA: Diagnosis not present

## 2015-11-24 DIAGNOSIS — Z95828 Presence of other vascular implants and grafts: Secondary | ICD-10-CM | POA: Diagnosis not present

## 2015-11-24 DIAGNOSIS — C3491 Malignant neoplasm of unspecified part of right bronchus or lung: Secondary | ICD-10-CM | POA: Diagnosis not present

## 2015-11-24 DIAGNOSIS — R634 Abnormal weight loss: Secondary | ICD-10-CM | POA: Diagnosis not present

## 2015-11-25 ENCOUNTER — Ambulatory Visit (INDEPENDENT_AMBULATORY_CARE_PROVIDER_SITE_OTHER): Payer: Medicare Other | Admitting: Nurse Practitioner

## 2015-11-25 ENCOUNTER — Encounter: Payer: Self-pay | Admitting: Nurse Practitioner

## 2015-11-25 VITALS — BP 147/70 | HR 77 | Temp 96.9°F | Ht 67.0 in | Wt 133.6 lb

## 2015-11-25 DIAGNOSIS — E876 Hypokalemia: Secondary | ICD-10-CM | POA: Diagnosis not present

## 2015-11-25 DIAGNOSIS — I10 Essential (primary) hypertension: Secondary | ICD-10-CM

## 2015-11-25 DIAGNOSIS — E039 Hypothyroidism, unspecified: Secondary | ICD-10-CM

## 2015-11-25 DIAGNOSIS — C3491 Malignant neoplasm of unspecified part of right bronchus or lung: Secondary | ICD-10-CM

## 2015-11-25 DIAGNOSIS — F411 Generalized anxiety disorder: Secondary | ICD-10-CM | POA: Diagnosis not present

## 2015-11-25 DIAGNOSIS — F32A Depression, unspecified: Secondary | ICD-10-CM

## 2015-11-25 DIAGNOSIS — F329 Major depressive disorder, single episode, unspecified: Secondary | ICD-10-CM

## 2015-11-25 DIAGNOSIS — C50211 Malignant neoplasm of upper-inner quadrant of right female breast: Secondary | ICD-10-CM

## 2015-11-25 MED ORDER — LEVOTHYROXINE SODIUM 112 MCG PO TABS: 112.0000 ug | ORAL_TABLET | Freq: Every day | ORAL | Status: DC

## 2015-11-25 MED ORDER — DILTIAZEM HCL ER COATED BEADS 180 MG PO CP24: ORAL_CAPSULE | ORAL | Status: DC

## 2015-11-25 MED ORDER — ALPRAZOLAM 1 MG PO TABS
1.0000 mg | ORAL_TABLET | Freq: Three times a day (TID) | ORAL | Status: DC | PRN
Start: 2015-11-25 — End: 2015-12-01

## 2015-11-25 MED ORDER — LISINOPRIL 40 MG PO TABS: 40.0000 mg | ORAL_TABLET | Freq: Every day | ORAL | Status: DC

## 2015-11-25 NOTE — Patient Instructions (Signed)
High-Protein and High-Calorie Diet Eating high-protein and high-calorie foods can help you to gain weight, heal after an injury, and recover after an illness or surgery.  WHAT IS MY PLAN? The specific amount of daily protein and calories you need depends on:  Your body weight.  The reason this diet is recommended for you. Generally, a high-protein, high-calorie diet involves:   Eating 250-500 extra calories each day.  Making sure that 10-35% of your daily calories come from protein. Talk to your health care provider about how much protein and how many calories you need each day. Follow the diet as directed by your health care provider.  WHAT DO I NEED TO KNOW ABOUT THIS DIET?  Ask your health care provider if you should take a nutritional supplement.   Try to eat six small meals each day instead of three large meals.   Eat a balanced diet, including one food that is high in protein at each meal.  Keep nutritious snacks handy, such as nuts, trail mixes, dried fruit, and yogurt.   If you have kidney disease or diabetes, eating too much protein may put extra stress on your kidneys. Talk to your health care provider if you have either of those conditions. WHAT ARE SOME HIGH-PROTEIN FOODS? Grains Quinoa. Bulgur wheat. Vegetables Soybeans. Peas. Meats and Other Protein Sources Beef, pork, and poultry. Fish and seafood. Eggs. Tofu. Textured vegetable protein (TVP). Peanut butter. Nuts and seeds. Dried beans. Protein powders. Dairy Whole milk. Whole-milk yogurt. Powdered milk. Cheese. [employer]. Eggnog. Beverages High-protein supplement drinks. Soy milk. Other Protein bars. The items listed above may not be a complete list of recommended foods or beverages. Contact your dietitian for more options. WHAT ARE SOME HIGH-CALORIE FOODS? Grains Pasta. Quick breads. Muffins. Pancakes. Ready-to-eat cereal. Vegetables Vegetables cooked in oil or butter. Fried  potatoes. Fruits Dried fruit. Fruit leather. Canned fruit in syrup. Fruit juice. Avocados. Meats and Other Protein Sources Peanut butter. Nuts and seeds. Dairy Heavy cream. Whipped cream. Cream cheese. Sour cream. Ice cream. Custard. Pudding. Beverages Meal-replacement beverages. Nutrition shakes. Fruit juice. Sugar-sweetened soft drinks. Condiments Salad dressing. Mayonnaise. Alfredo sauce. Fruit preserves or jelly. Honey. Syrup. Sweets/Desserts Cake. Cookies. Pie. Pastries. Candy bars. Chocolate. Fats and Oils Butter or margarine. Oil. Gravy. Other Meal-replacement bars. The items listed above may not be a complete list of recommended foods or beverages. Contact your dietitian for more options. WHAT ARE SOME TIPS FOR INCLUDING HIGH-PROTEIN AND HIGH-CALORIE FOODS IN MY DIET?  Add whole milk, half-and-half, or heavy cream to cereal, pudding, soup, or hot cocoa.  Add whole milk to instant breakfast drinks.  Add peanut butter to oatmeal or smoothies.  Add powdered milk to baked goods, smoothies, or milkshakes.  Add powdered milk, cream, or butter to mashed potatoes.  Add cheese to cooked vegetables.  Make whole-milk yogurt parfaits. Top them with granola, fruit, or nuts.  Add cottage cheese to your fruit.  Add avocados, cheese, or both to sandwiches or salads.  Add meat, poultry, or seafood to [place], pasta, casseroles, salads, and soups.   Use mayonnaise when making egg salad, chicken salad, or tuna salad.  Use peanut butter as a topping for pretzels, celery, or crackers.  Add beans to casseroles, dips, and spreads.  Add pureed beans to sauces and soups.  Replace calorie-free drinks with calorie-containing drinks, such as milk and fruit juice.   This information is not intended to replace advice given to you by your health care provider. Make sure you discuss  any questions you have with your health care provider.   Document Released: 11/12/2005 Document  Revised: 12/03/2014 Document Reviewed: 04/27/2014 Elsevier Interactive Patient Education [employer].

## 2015-11-25 NOTE — Progress Notes (Signed)
Subjective:    Patient ID: [name], female    DOB: Aug 12, 1941, 74 y.o.   MRN: [id number]  Patient here today for follow up of chronic medical problems   Hypertension This is a chronic problem. The current episode started more than 1 year ago. The problem is unchanged. The problem is controlled. Pertinent negatives include no chest pain or palpitations. Risk factors for coronary artery disease include dyslipidemia, post-menopausal state and sedentary lifestyle. Past treatments include ACE inhibitors. The current treatment provides moderate improvement. Compliance problems include diet and exercise.  Hypertensive end-organ damage includes a thyroid problem.  Thyroid Problem Patient reports no cold intolerance, constipation, diarrhea, heat intolerance, palpitations, tremors or visual change.  Smoker occassional use of flovent when she starts wheezing. GAD/depression Only thing she is taking is xanax '1mg'$ - takes at least 1 x a day hypokalemia No c/o lower ext cramping   *Just completed right breast cancer treatment- has metastasis in right lung and bone of right lower leg. Is currently just taking oral chemo daily. Feels very weak and having trouble keeping weight up- she drinks boost daily.  Review of Systems  Cardiovascular: Negative for chest pain and palpitations.  Gastrointestinal: Negative for diarrhea and constipation.  Endocrine: Negative for cold intolerance and heat intolerance.  Neurological: Negative for tremors.       Objective:   Physical Exam  Constitutional: She is oriented to person, place, and time. She appears well-developed and well-nourished.  HENT:  Nose: Nose normal.  Mouth/Throat: Oropharynx is clear and moist.  Eyes: EOM are normal.  Neck: Trachea normal, normal range of motion and full passive range of motion without pain. Neck supple. No JVD present. Carotid bruit is not present. No thyromegaly present.  Cardiovascular: Normal rate, regular rhythm,  normal heart sounds and intact distal pulses.  Exam reveals no gallop and no friction rub.   No murmur heard. Pulmonary/Chest: Effort normal and breath sounds normal. Right breast exhibits no inverted nipple, no nipple discharge, no skin change and no tenderness. Left breast exhibits no inverted nipple, no mass, no nipple discharge, no skin change and no tenderness.  Right breast mastectomy with lymph node removal.   Abdominal: Soft. Bowel sounds are normal. She exhibits no distension and no mass. There is no tenderness.  Musculoskeletal: Normal range of motion.  Lymphadenopathy:    She has no cervical adenopathy.  Neurological: She is alert and oriented to person, place, and time. She has normal reflexes.  Skin: Skin is warm and dry.  Psychiatric: She has a normal mood and affect. Her behavior is normal. Judgment and thought content normal.   BP 147/70 mmHg  Pulse 77  Temp(Src) 96.9 F (36.1 C) (Oral)  Ht '5\' 7"'$  (1.702 m)  Wt 133 lb 9.6 oz (60.601 kg)  BMI 20.92 kg/m2      Assessment & Plan:  1. Essential hypertension Do not add salt to diet - lisinopril (PRINIVIL,ZESTRIL) 40 MG tablet; Take 1 tablet (40 mg total) by mouth daily.  Dispense: 90 tablet; Refill: 1 - diltiazem (CARTIA XT) 180 MG 24 hr capsule; TAKE 1 CAPSULE BY MOUTH ONCE A DAY.  Dispense: 90 capsule; Refill: 1 - CMP14+EGFR - Lipid panel  2. Malignant neoplasm of right lung, unspecified part of lung ([place])  3. Hypothyroidism, unspecified hypothyroidism type - levothyroxine (SYNTHROID, LEVOTHROID) 112 MCG tablet; Take 1 tablet (112 mcg total) by mouth daily before breakfast.  Dispense: 90 tablet; Refill: 1 - Thyroid Panel With TSH  4. Bone metastases ([place])  5. Depression Stress management  6. Generalized anxiety disorder Stress management - ALPRAZolam (XANAX) 1 MG tablet; Take 1 tablet (1 mg total) by mouth 3 (three) times daily as needed for sleep.  Dispense: 90 tablet; Refill: 2  7. Weight loss NEED TO EAT  3 MEALS A DAY  8. Tobacco abuser Smoking cessation encouraged  Labs pending Health maintenance reviewed Diet and exercise encouraged Continue all meds Follow up  In 3 month   [place], FNP

## 2015-11-26 LAB — THYROID PANEL WITH TSH
FREE THYROXINE INDEX: 3.8 (ref 1.2–4.9)
T3 UPTAKE RATIO: 29 % (ref 24–39)
T4 TOTAL: 13 ug/dL — AB (ref 4.5–12.0)
TSH: 1.81 u[IU]/mL (ref 0.450–4.500)

## 2015-11-26 LAB — CMP14+EGFR
A/G RATIO: 2.1 (ref 1.1–2.5)
ALK PHOS: 47 IU/L (ref 39–117)
ALT: 9 IU/L (ref 0–32)
AST: 19 IU/L (ref 0–40)
Albumin: 4.5 g/dL (ref 3.5–4.8)
BUN/Creatinine Ratio: 19 (ref 11–26)
BUN: 15 mg/dL (ref 8–27)
Bilirubin Total: 0.3 mg/dL (ref 0.0–1.2)
CO2: 27 mmol/L (ref 18–29)
Calcium: 9.7 mg/dL (ref 8.7–10.3)
Chloride: 93 mmol/L — ABNORMAL LOW (ref 96–106)
Creatinine, Ser: 0.78 mg/dL (ref 0.57–1.00)
GFR calc Af Amer: 87 mL/min/{1.73_m2} (ref >59)
GFR calc non Af Amer: 76 mL/min/{1.73_m2} (ref >59)
GLOBULIN, TOTAL: 2.1 g/dL (ref 1.5–4.5)
Glucose: 133 mg/dL — ABNORMAL HIGH (ref 65–99)
POTASSIUM: 4.3 mmol/L (ref 3.5–5.2)
SODIUM: 134 mmol/L (ref 134–144)
Total Protein: 6.6 g/dL (ref 6.0–8.5)

## 2015-11-26 LAB — LIPID PANEL
CHOL/HDL RATIO: 1.7 ratio (ref 0.0–4.4)
CHOLESTEROL TOTAL: 134 mg/dL (ref 100–199)
HDL: 77 mg/dL (ref >39)
LDL Calculated: 48 mg/dL (ref 0–99)
TRIGLYCERIDES: 46 mg/dL (ref 0–149)
VLDL Cholesterol Cal: 9 mg/dL (ref 5–40)

## 2015-12-01 ENCOUNTER — Other Ambulatory Visit: Payer: Self-pay | Admitting: Nurse Practitioner

## 2015-12-01 NOTE — Telephone Encounter (Signed)
rx called into pharmacy

## 2015-12-01 NOTE — Telephone Encounter (Signed)
Please call in xanax with 1 refills 

## 2015-12-01 NOTE — Telephone Encounter (Signed)
Last filled 10/31/15, last seen 11/25/15. Call in at  [facility]

## 2015-12-08 ENCOUNTER — Ambulatory Visit: Payer: Medicare Other | Admitting: Nurse Practitioner

## 2015-12-13 DIAGNOSIS — C7951 Secondary malignant neoplasm of bone: Secondary | ICD-10-CM | POA: Diagnosis not present

## 2015-12-13 DIAGNOSIS — C800 Disseminated malignant neoplasm, unspecified: Secondary | ICD-10-CM | POA: Diagnosis not present

## 2015-12-13 DIAGNOSIS — Z95828 Presence of other vascular implants and grafts: Secondary | ICD-10-CM | POA: Diagnosis not present

## 2015-12-13 DIAGNOSIS — C50011 Malignant neoplasm of nipple and areola, right female breast: Secondary | ICD-10-CM | POA: Diagnosis not present

## 2015-12-13 DIAGNOSIS — C50911 Malignant neoplasm of unspecified site of right female breast: Secondary | ICD-10-CM | POA: Diagnosis not present

## 2015-12-13 DIAGNOSIS — C3491 Malignant neoplasm of unspecified part of right bronchus or lung: Secondary | ICD-10-CM | POA: Diagnosis not present

## 2015-12-22 DIAGNOSIS — C50911 Malignant neoplasm of unspecified site of right female breast: Secondary | ICD-10-CM | POA: Diagnosis not present

## 2015-12-22 DIAGNOSIS — I2510 Atherosclerotic heart disease of native coronary artery without angina pectoris: Secondary | ICD-10-CM | POA: Diagnosis not present

## 2015-12-22 DIAGNOSIS — C800 Disseminated malignant neoplasm, unspecified: Secondary | ICD-10-CM | POA: Diagnosis not present

## 2015-12-22 DIAGNOSIS — K5730 Diverticulosis of large intestine without perforation or abscess without bleeding: Secondary | ICD-10-CM | POA: Diagnosis not present

## 2015-12-22 DIAGNOSIS — Z853 Personal history of malignant neoplasm of breast: Secondary | ICD-10-CM | POA: Diagnosis not present

## 2015-12-22 DIAGNOSIS — C3491 Malignant neoplasm of unspecified part of right bronchus or lung: Secondary | ICD-10-CM | POA: Diagnosis not present

## 2015-12-22 DIAGNOSIS — C7951 Secondary malignant neoplasm of bone: Secondary | ICD-10-CM | POA: Diagnosis not present

## 2015-12-23 DIAGNOSIS — K869 Disease of pancreas, unspecified: Secondary | ICD-10-CM | POA: Diagnosis not present

## 2015-12-23 DIAGNOSIS — C7951 Secondary malignant neoplasm of bone: Secondary | ICD-10-CM | POA: Diagnosis not present

## 2015-12-23 DIAGNOSIS — C50911 Malignant neoplasm of unspecified site of right female breast: Secondary | ICD-10-CM | POA: Diagnosis not present

## 2015-12-23 DIAGNOSIS — R52 Pain, unspecified: Secondary | ICD-10-CM | POA: Diagnosis not present

## 2015-12-23 DIAGNOSIS — R200 Anesthesia of skin: Secondary | ICD-10-CM | POA: Diagnosis not present

## 2015-12-23 DIAGNOSIS — C7889 Secondary malignant neoplasm of other digestive organs: Secondary | ICD-10-CM | POA: Diagnosis not present

## 2015-12-23 DIAGNOSIS — C800 Disseminated malignant neoplasm, unspecified: Secondary | ICD-10-CM | POA: Diagnosis not present

## 2015-12-23 DIAGNOSIS — C3491 Malignant neoplasm of unspecified part of right bronchus or lung: Secondary | ICD-10-CM | POA: Diagnosis not present

## 2015-12-23 DIAGNOSIS — Z9989 Dependence on other enabling machines and devices: Secondary | ICD-10-CM | POA: Diagnosis not present

## 2015-12-24 DIAGNOSIS — K5641 Fecal impaction: Secondary | ICD-10-CM | POA: Diagnosis not present

## 2015-12-24 DIAGNOSIS — K5900 Constipation, unspecified: Secondary | ICD-10-CM | POA: Diagnosis not present

## 2015-12-24 DIAGNOSIS — C50919 Malignant neoplasm of unspecified site of unspecified female breast: Secondary | ICD-10-CM | POA: Diagnosis not present

## 2015-12-24 DIAGNOSIS — J449 Chronic obstructive pulmonary disease, unspecified: Secondary | ICD-10-CM | POA: Diagnosis not present

## 2015-12-24 DIAGNOSIS — Z85118 Personal history of other malignant neoplasm of bronchus and lung: Secondary | ICD-10-CM | POA: Diagnosis not present

## 2015-12-24 DIAGNOSIS — M810 Age-related osteoporosis without current pathological fracture: Secondary | ICD-10-CM | POA: Diagnosis not present

## 2015-12-24 DIAGNOSIS — E039 Hypothyroidism, unspecified: Secondary | ICD-10-CM | POA: Diagnosis not present

## 2015-12-24 DIAGNOSIS — Z79899 Other long term (current) drug therapy: Secondary | ICD-10-CM | POA: Diagnosis not present

## 2015-12-24 DIAGNOSIS — I10 Essential (primary) hypertension: Secondary | ICD-10-CM | POA: Diagnosis not present

## 2015-12-24 DIAGNOSIS — C7951 Secondary malignant neoplasm of bone: Secondary | ICD-10-CM | POA: Diagnosis not present

## 2015-12-26 DIAGNOSIS — Z87891 Personal history of nicotine dependence: Secondary | ICD-10-CM | POA: Diagnosis not present

## 2015-12-26 DIAGNOSIS — R269 Unspecified abnormalities of gait and mobility: Secondary | ICD-10-CM | POA: Diagnosis not present

## 2015-12-26 DIAGNOSIS — C3491 Malignant neoplasm of unspecified part of right bronchus or lung: Secondary | ICD-10-CM | POA: Diagnosis not present

## 2015-12-26 DIAGNOSIS — C50911 Malignant neoplasm of unspecified site of right female breast: Secondary | ICD-10-CM | POA: Diagnosis not present

## 2015-12-26 DIAGNOSIS — R200 Anesthesia of skin: Secondary | ICD-10-CM | POA: Diagnosis not present

## 2015-12-26 DIAGNOSIS — C7951 Secondary malignant neoplasm of bone: Secondary | ICD-10-CM | POA: Diagnosis not present

## 2015-12-26 DIAGNOSIS — C3411 Malignant neoplasm of upper lobe, right bronchus or lung: Secondary | ICD-10-CM | POA: Diagnosis not present

## 2015-12-26 DIAGNOSIS — C771 Secondary and unspecified malignant neoplasm of intrathoracic lymph nodes: Secondary | ICD-10-CM | POA: Diagnosis not present

## 2015-12-26 DIAGNOSIS — C7889 Secondary malignant neoplasm of other digestive organs: Secondary | ICD-10-CM | POA: Diagnosis not present

## 2015-12-26 DIAGNOSIS — C7981 Secondary malignant neoplasm of breast: Secondary | ICD-10-CM | POA: Diagnosis not present

## 2015-12-28 DIAGNOSIS — C7951 Secondary malignant neoplasm of bone: Secondary | ICD-10-CM | POA: Diagnosis not present

## 2015-12-29 DIAGNOSIS — N390 Urinary tract infection, site not specified: Secondary | ICD-10-CM | POA: Diagnosis not present

## 2016-01-23 ENCOUNTER — Other Ambulatory Visit: Payer: Self-pay | Admitting: Family Medicine

## 2016-02-24 ENCOUNTER — Ambulatory Visit: Payer: Medicare Other | Admitting: Nurse Practitioner

## 2016-04-01 IMAGING — CT CT BIOPSY
1 of 3 series · 14 of 32 positions shown, 19 images · non-contrast
Comparison: none

CLINICAL DATA: Medial right upper lobe lung mass, PET positive

[Series 2: i-spiral 5.0 b40f · axial · 0.60mm/px · z∈[+1348,+1442]mm · 14 of 31 slices shown, 19 images]
[im 2/31  soft-tissue]
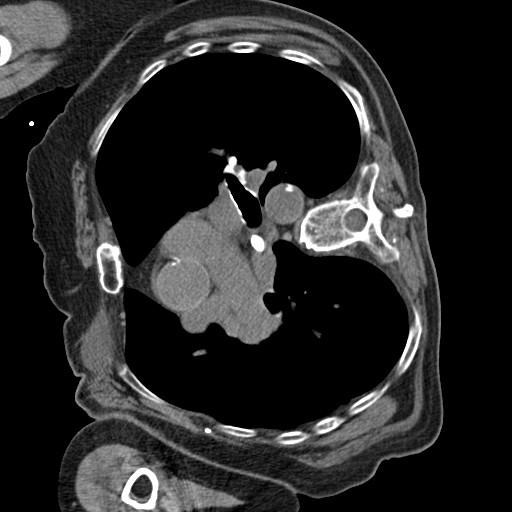
[im 2/31  bone]
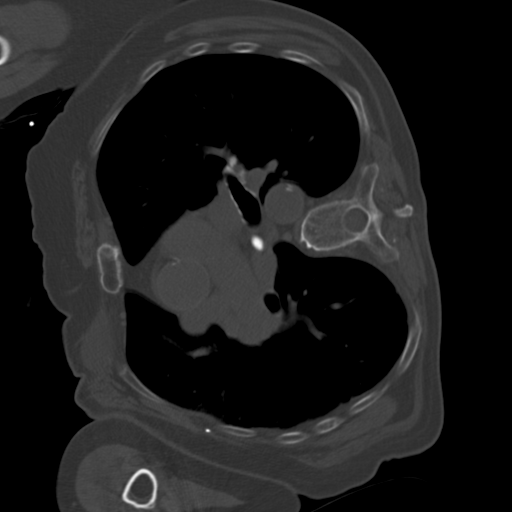
[im 4/31  soft-tissue]
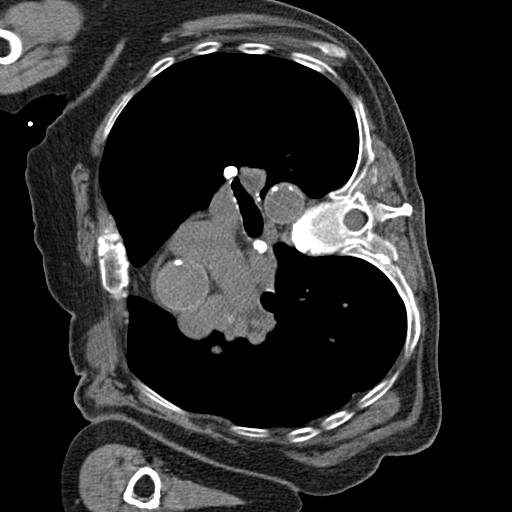
[im 6/31  soft-tissue]
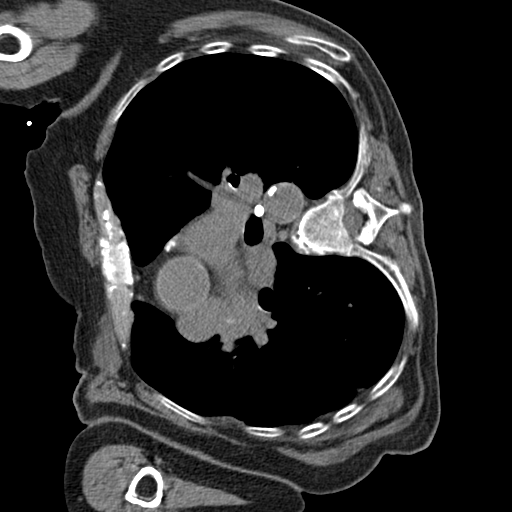
[im 10/31  soft-tissue]
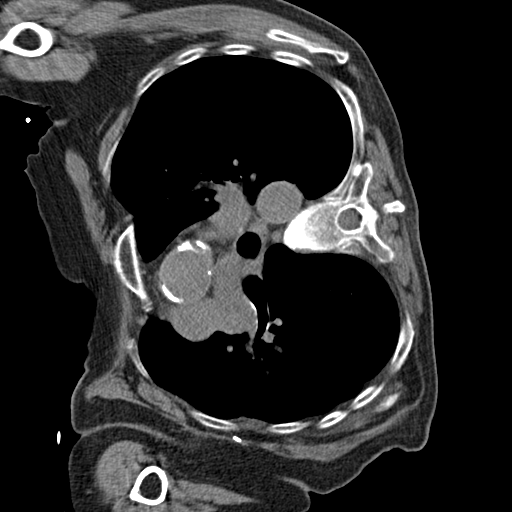
[im 12/31  soft-tissue]
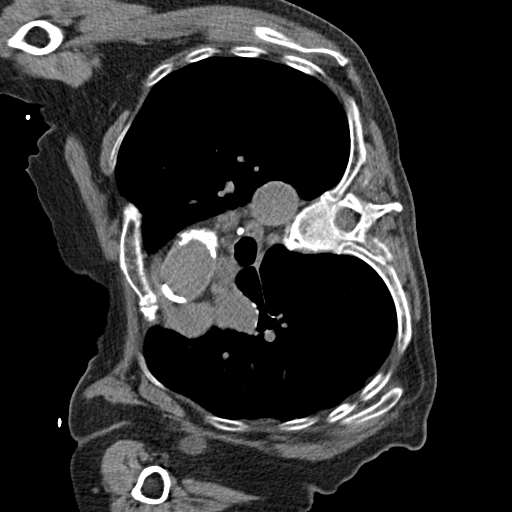
[im 14/31  soft-tissue]
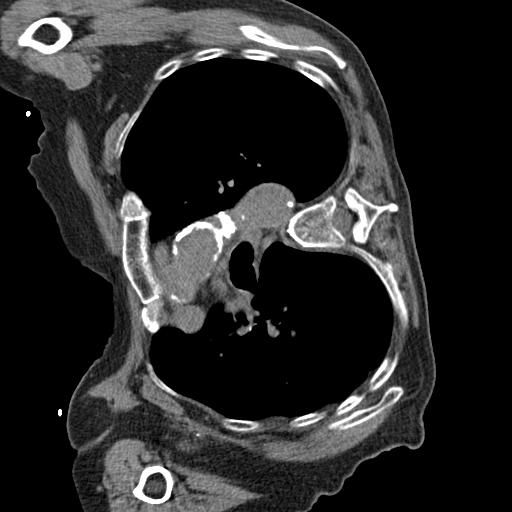
[im 16/31  soft-tissue]
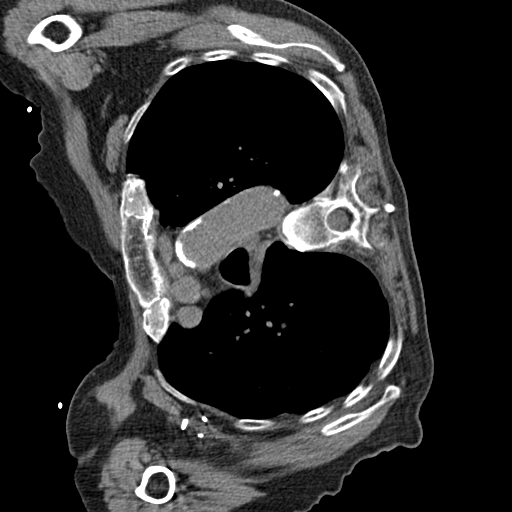
[im 17/31  soft-tissue]
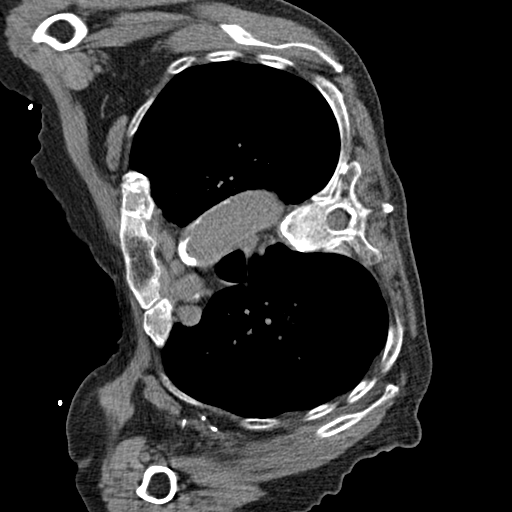
[im 19/31  soft-tissue]
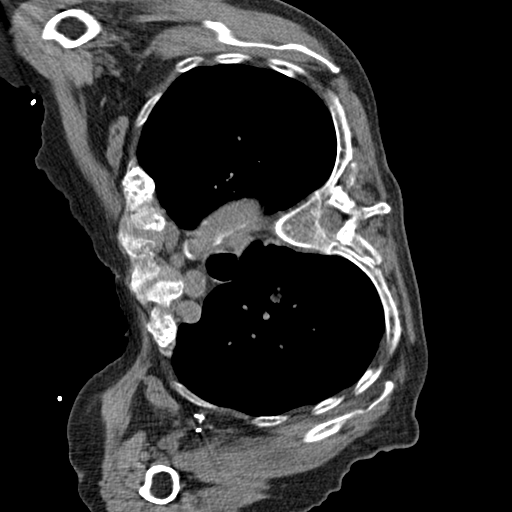
[im 19/31  bone]
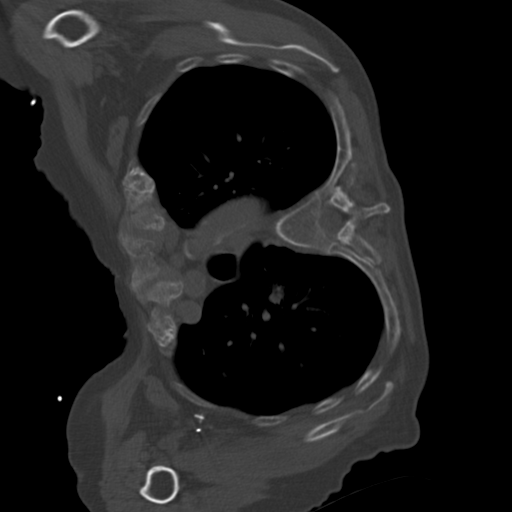
[im 21/31  soft-tissue]
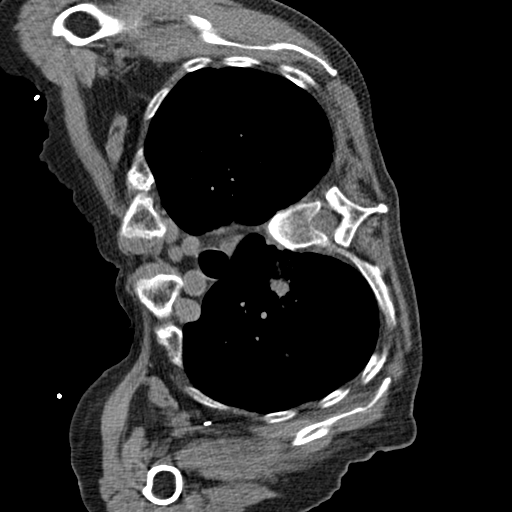
[im 23/31  lung]
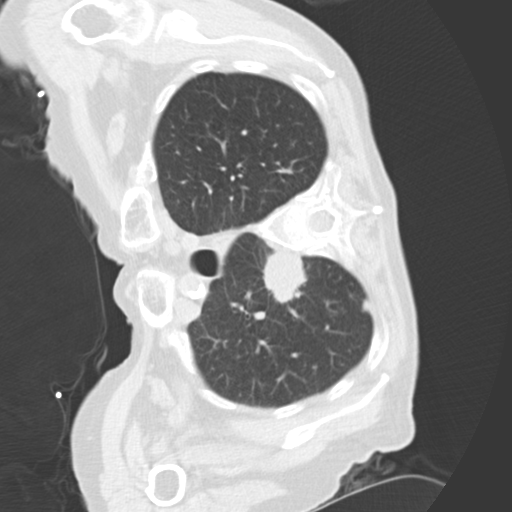
[im 25/31  soft-tissue]
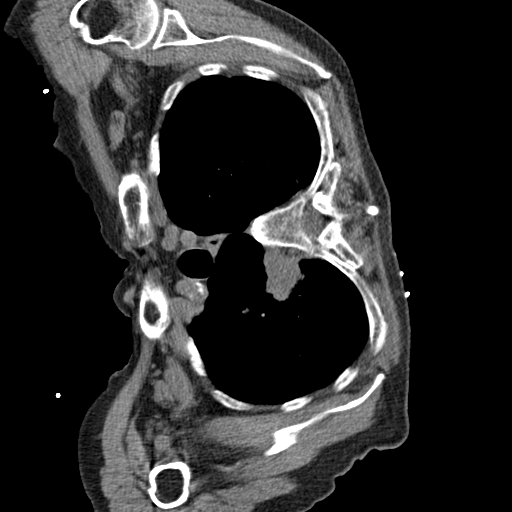
[im 25/31  lung]
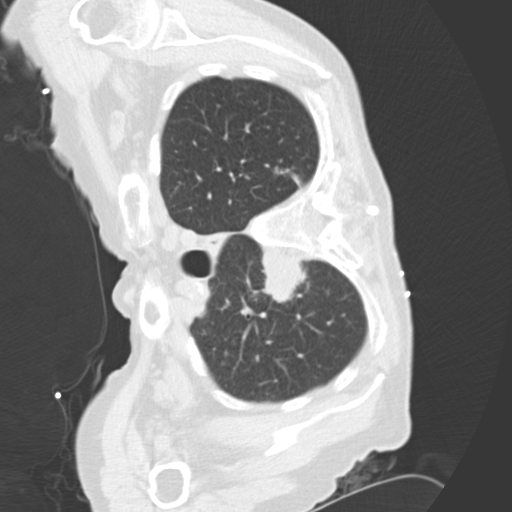
[im 27/31  soft-tissue]
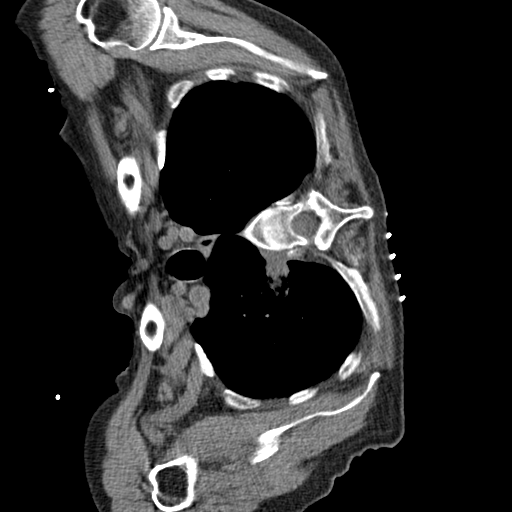
[im 27/31  lung]
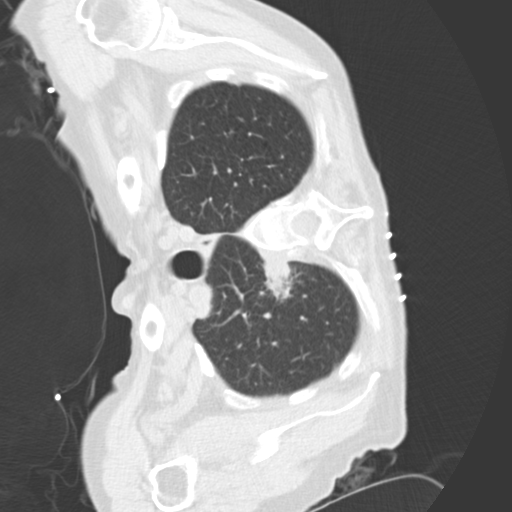
[im 29/31  soft-tissue]
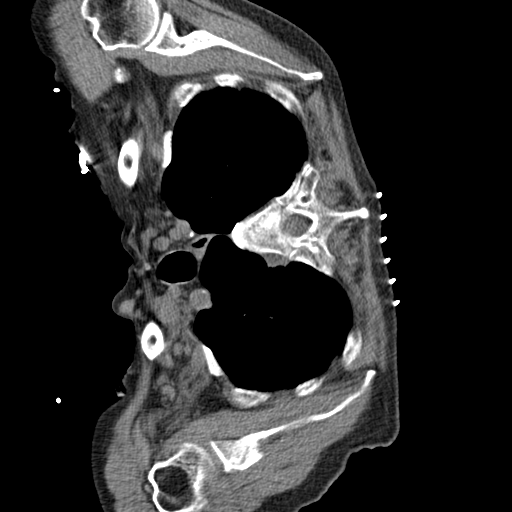
[im 29/31  lung]
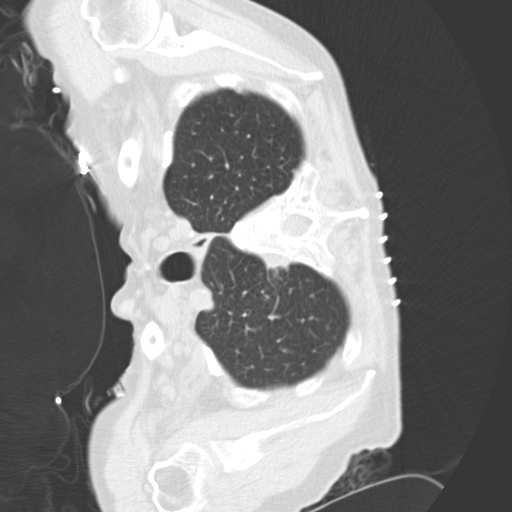

[14 of 32 positions shown; findings below may reference images not displayed]

EXAM:
CT GUIDED CORE BIOPSY OF THE MEDIAL RIGHT UPPER LOBE MASS

ANESTHESIA/SEDATION:
ONE  Mg IV Versed; 15 mcg IV Fentanyl

Total Moderate Sedation Time: 10 minutes.

PROCEDURE:
The procedure risks, benefits, and alternatives were explained to
the patient. Questions regarding the procedure were encouraged and
answered. The patient understands and consents to the procedure.

The right posterior back was prepped with Betadine.In a sterile
fashion, and a sterile drape was applied covering the operative
field. A sterile gown and sterile gloves were used for the
procedure. Local anesthesia was provided with 1% Lidocaine.

Previous imaging reviewed. Patient positioned right side down
decubitus. Noncontrast localization CT performed. The medial right
upper lobe mass was localized. Under sterile conditions and local
anesthesia, a 17 gauge 6.8 cm access needle was advanced from a
posterior paraspinous approach into the right upper lobe mass.
Needle position confirmed with CT. [DATE] gauge core biopsies
obtained. Samples were intact and non fragmented. These were placed
in formalin. Needle removed. Postprocedure imaging demonstrates no
hemorrhage or hematoma. Patient tolerated the biopsy well. No
effusion.

Complications: None immediate
FINDINGS: Imaging confirms needle placement in the medial right upper lobe
mass for core biopsy
IMPRESSION: Successful CT-guided right upper lobe mass 18 gauge core biopsies

## 2016-04-01 IMAGING — CR DG CHEST 1V
1 series · 1 of 1 positions shown · non-contrast
Comparison: CT scan of today's date and chest x-ray of July 14, 2014

CLINICAL DATA: Status post CT biopsy day for right upper lobe mass

EXAM:
CHEST - 1 VIEW

[w chest pa]
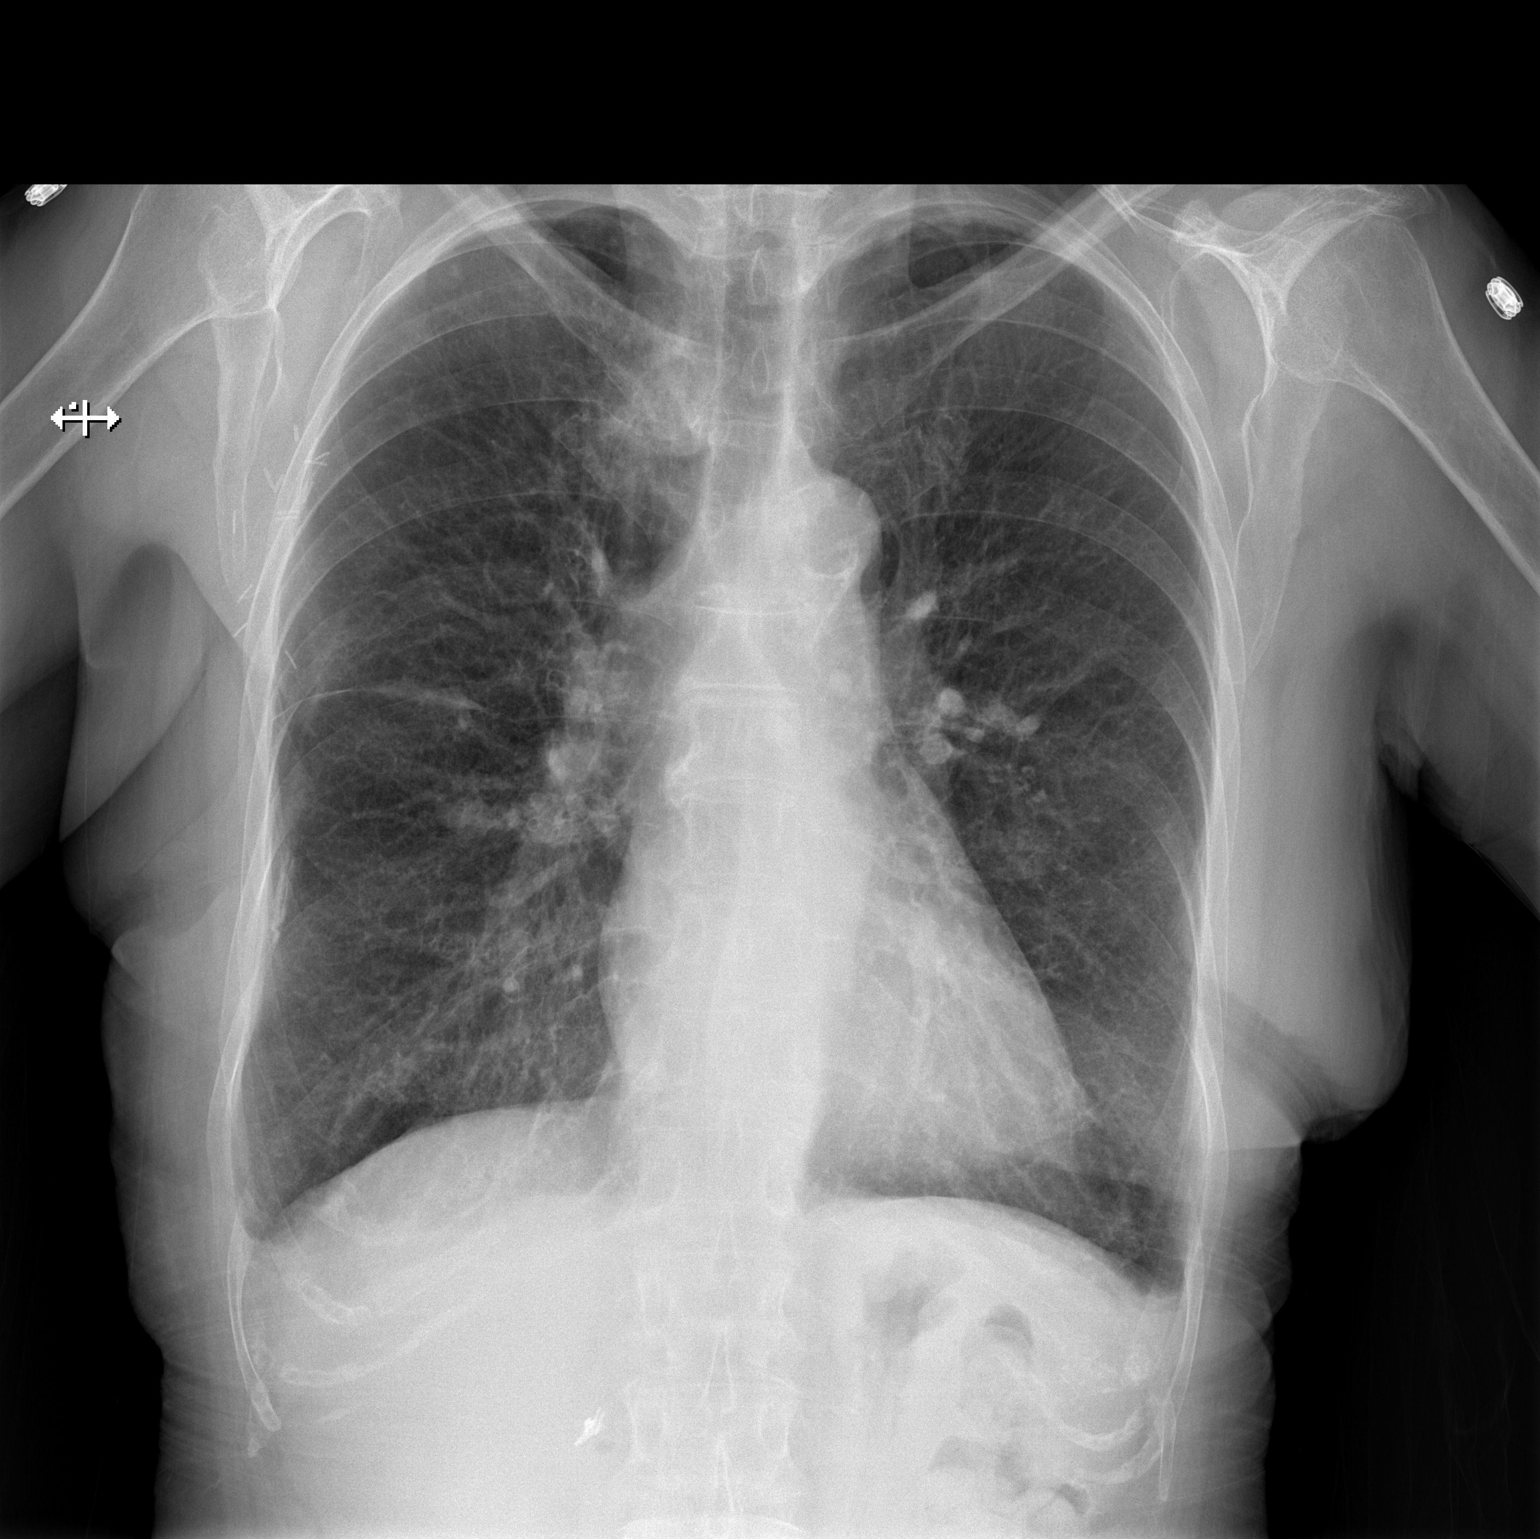

[1 of 1 positions shown; findings below may reference images not displayed]

FINDINGS: The lungs are hyperinflated. There is no pneumothorax nor
significant pleural effusion. The heart and pulmonary vascularity
are normal. There is mild thickening of the minor fissure which is
not entirely new. The mediastinum is normal in width. The bony
thorax exhibits no acute abnormality.
IMPRESSION: There is no postprocedure pneumothorax or hemothorax. There are
stable findings of COPD.

## 2016-04-16 ENCOUNTER — Other Ambulatory Visit: Payer: Self-pay | Admitting: Family Medicine

## 2016-04-17 NOTE — Telephone Encounter (Signed)
Last seen 11/25/15 MMM  Requesting 90 day supply

## 2016-04-17 NOTE — Telephone Encounter (Signed)
Last refill without being seen 

## 2016-04-17 NOTE — Telephone Encounter (Signed)
Detailed message left for patient that she is due to be seen.

## 2016-07-27 DEATH — deceased

## 2017-12-03 ENCOUNTER — Other Ambulatory Visit: Payer: Self-pay | Admitting: Nurse Practitioner
